# Patient Record
Sex: Male | Born: 1950 | Race: Black or African American | Hispanic: No | Marital: Single | Smoking: Former smoker
Health system: Southern US, Community
[De-identification: ages and names within clinical notes are randomized; demographics above are authoritative.]

## PROBLEM LIST (undated history)

## (undated) DIAGNOSIS — G4730 Sleep apnea, unspecified: Secondary | ICD-10-CM

## (undated) DIAGNOSIS — N2581 Secondary hyperparathyroidism of renal origin: Secondary | ICD-10-CM

## (undated) DIAGNOSIS — I509 Heart failure, unspecified: Secondary | ICD-10-CM

## (undated) DIAGNOSIS — N186 End stage renal disease: Secondary | ICD-10-CM

## (undated) DIAGNOSIS — G809 Cerebral palsy, unspecified: Secondary | ICD-10-CM

## (undated) DIAGNOSIS — N022 Recurrent and persistent hematuria with diffuse membranous glomerulonephritis: Secondary | ICD-10-CM

## (undated) DIAGNOSIS — N289 Disorder of kidney and ureter, unspecified: Secondary | ICD-10-CM

## (undated) DIAGNOSIS — N039 Chronic nephritic syndrome with unspecified morphologic changes: Secondary | ICD-10-CM

## (undated) DIAGNOSIS — I10 Essential (primary) hypertension: Secondary | ICD-10-CM

## (undated) DIAGNOSIS — E785 Hyperlipidemia, unspecified: Secondary | ICD-10-CM

## (undated) DIAGNOSIS — K219 Gastro-esophageal reflux disease without esophagitis: Secondary | ICD-10-CM

## (undated) DIAGNOSIS — D869 Sarcoidosis, unspecified: Secondary | ICD-10-CM

## (undated) DIAGNOSIS — D631 Anemia in chronic kidney disease: Secondary | ICD-10-CM

---

## 2002-03-24 ENCOUNTER — Emergency Department (HOSPITAL_COMMUNITY): Admission: EM | Admit: 2002-03-24 | Discharge: 2002-03-24 | Payer: Self-pay | Admitting: Emergency Medicine

## 2002-03-24 ENCOUNTER — Encounter: Payer: Self-pay | Admitting: Emergency Medicine

## 2005-04-26 ENCOUNTER — Ambulatory Visit: Payer: Self-pay | Admitting: General Practice

## 2005-08-31 ENCOUNTER — Ambulatory Visit: Payer: Self-pay | Admitting: Vascular Surgery

## 2005-08-31 ENCOUNTER — Other Ambulatory Visit: Payer: Self-pay

## 2005-09-06 ENCOUNTER — Ambulatory Visit: Payer: Self-pay | Admitting: Vascular Surgery

## 2006-02-03 ENCOUNTER — Other Ambulatory Visit: Payer: Self-pay

## 2006-02-03 ENCOUNTER — Emergency Department: Payer: Self-pay | Admitting: General Practice

## 2006-11-22 ENCOUNTER — Ambulatory Visit: Payer: Self-pay | Admitting: Internal Medicine

## 2006-11-30 ENCOUNTER — Ambulatory Visit: Payer: Self-pay | Admitting: Internal Medicine

## 2006-12-06 ENCOUNTER — Other Ambulatory Visit: Payer: Self-pay

## 2006-12-06 ENCOUNTER — Inpatient Hospital Stay: Payer: Self-pay | Admitting: Internal Medicine

## 2008-10-23 ENCOUNTER — Ambulatory Visit: Payer: Self-pay | Admitting: Internal Medicine

## 2010-03-08 ENCOUNTER — Ambulatory Visit: Payer: Self-pay | Admitting: Vascular Surgery

## 2010-08-25 ENCOUNTER — Ambulatory Visit: Payer: Self-pay | Admitting: Vascular Surgery

## 2010-12-12 ENCOUNTER — Ambulatory Visit: Payer: Self-pay | Admitting: Vascular Surgery

## 2011-05-01 ENCOUNTER — Inpatient Hospital Stay: Payer: Self-pay | Admitting: Internal Medicine

## 2011-12-04 ENCOUNTER — Inpatient Hospital Stay: Payer: Self-pay | Admitting: Internal Medicine

## 2011-12-04 LAB — CBC WITH DIFFERENTIAL/PLATELET
Basophil #: 0 x10 3/mm 3
Basophil %: 0.3 %
Eosinophil #: 0 x10 3/mm 3
Eosinophil %: 0.1 %
HCT: 30.2 % — ABNORMAL LOW
HGB: 10.3 g/dL — ABNORMAL LOW
Lymphocyte %: 9.9 %
Lymphs Abs: 0.8 x10 3/mm 3 — ABNORMAL LOW
MCH: 32.3 pg
MCHC: 34 g/dL
MCV: 95 fL
Monocyte #: 1.2 "x10 3/mm " — ABNORMAL HIGH
Monocyte %: 14.6 %
Neutrophil #: 6.3 x10 3/mm 3
Neutrophil %: 75.1 %
Platelet: 123 x10 3/mm 3 — ABNORMAL LOW
RBC: 3.19 x10 6/mm 3 — ABNORMAL LOW
RDW: 13.8 %
WBC: 8.4 x10 3/mm 3

## 2011-12-04 LAB — COMPREHENSIVE METABOLIC PANEL WITH GFR
Albumin: 3.2 g/dL — ABNORMAL LOW
Alkaline Phosphatase: 91 U/L
Anion Gap: 10
BUN: 86 mg/dL — ABNORMAL HIGH
Bilirubin,Total: 0.5 mg/dL
Calcium, Total: 9.4 mg/dL
Chloride: 102 mmol/L
Co2: 29 mmol/L
Creatinine: 12.94 mg/dL — ABNORMAL HIGH
EGFR (African American): 4 — ABNORMAL LOW
EGFR (Non-African Amer.): 4 — ABNORMAL LOW
Glucose: 105 mg/dL — ABNORMAL HIGH
Osmolality: 308
Potassium: 5.4 mmol/L — ABNORMAL HIGH
SGOT(AST): 25 U/L
SGPT (ALT): 20 U/L
Sodium: 141 mmol/L
Total Protein: 8 g/dL

## 2011-12-04 LAB — PROTIME-INR
INR: 1.1
Prothrombin Time: 14.1 s

## 2011-12-04 LAB — PHOSPHORUS: Phosphorus: 1.2 mg/dL — ABNORMAL LOW (ref 2.5–4.9)

## 2011-12-04 LAB — MAGNESIUM: Magnesium: 1.8 mg/dL

## 2011-12-04 LAB — APTT: Activated PTT: 37.4 s — ABNORMAL HIGH

## 2011-12-05 LAB — CBC WITH DIFFERENTIAL/PLATELET
Basophil #: 0 10*3/uL (ref 0.0–0.1)
Basophil %: 0.2 %
Eosinophil %: 0.2 %
Eosinophil %: 0 %
HGB: 9.9 g/dL — ABNORMAL LOW (ref 13.0–18.0)
Lymphocyte #: 0.3 10*3/uL — ABNORMAL LOW (ref 1.0–3.6)
Lymphocyte %: 2.8 %
MCHC: 32.1 g/dL (ref 32.0–36.0)
MCV: 96 fL (ref 80–100)
Monocyte #: 1.3 x10 3/mm — ABNORMAL HIGH (ref 0.2–1.0)
Monocyte #: 0.5 x10 3/mm (ref 0.2–1.0)
Monocyte %: 13.6 %
Neutrophil #: 10.9 10*3/uL — ABNORMAL HIGH (ref 1.4–6.5)
Neutrophil %: 75.9 %
Neutrophil %: 92.4 %
Platelet: 105 10*3/uL — ABNORMAL LOW (ref 150–440)
Platelet: 116 10*3/uL — ABNORMAL LOW (ref 150–440)
RBC: 3.1 10*6/uL — ABNORMAL LOW (ref 4.40–5.90)
RDW: 14.2 % (ref 11.5–14.5)
WBC: 9.8 10*3/uL (ref 3.8–10.6)

## 2011-12-05 LAB — RENAL FUNCTION PANEL
Anion Gap: 14 (ref 7–16)
Calcium, Total: 9 mg/dL (ref 8.5–10.1)
Chloride: 98 mmol/L (ref 98–107)
Co2: 26 mmol/L (ref 21–32)
Creatinine: 16.18 mg/dL — ABNORMAL HIGH (ref 0.60–1.30)
EGFR (African American): 3 — ABNORMAL LOW
EGFR (Non-African Amer.): 3 — ABNORMAL LOW
Glucose: 188 mg/dL — ABNORMAL HIGH (ref 65–99)
Phosphorus: 2.9 mg/dL (ref 2.5–4.9)
Potassium: 5.7 mmol/L — ABNORMAL HIGH (ref 3.5–5.1)
Sodium: 138 mmol/L (ref 136–145)

## 2011-12-05 LAB — BASIC METABOLIC PANEL
Calcium, Total: 9 mg/dL (ref 8.5–10.1)
Chloride: 101 mmol/L (ref 98–107)
Co2: 28 mmol/L (ref 21–32)
EGFR (Non-African Amer.): 3 — ABNORMAL LOW
Glucose: 102 mg/dL — ABNORMAL HIGH (ref 65–99)
Osmolality: 316 (ref 275–301)
Potassium: 5.4 mmol/L — ABNORMAL HIGH (ref 3.5–5.1)
Sodium: 143 mmol/L (ref 136–145)

## 2011-12-06 LAB — BASIC METABOLIC PANEL
Calcium, Total: 9 mg/dL (ref 8.5–10.1)
Co2: 26 mmol/L (ref 21–32)
EGFR (African American): 5 — ABNORMAL LOW
EGFR (Non-African Amer.): 4 — ABNORMAL LOW
Glucose: 189 mg/dL — ABNORMAL HIGH (ref 65–99)
Osmolality: 299 (ref 275–301)
Potassium: 4.9 mmol/L (ref 3.5–5.1)
Sodium: 137 mmol/L (ref 136–145)

## 2011-12-06 LAB — CBC WITH DIFFERENTIAL/PLATELET
Basophil #: 0 10*3/uL (ref 0.0–0.1)
Eosinophil #: 0 10*3/uL (ref 0.0–0.7)
Eosinophil %: 0 %
HGB: 10.4 g/dL — ABNORMAL LOW (ref 13.0–18.0)
MCH: 31.5 pg (ref 26.0–34.0)
MCV: 94 fL (ref 80–100)
Monocyte %: 3.5 %
Neutrophil %: 93.5 %
Platelet: 130 10*3/uL — ABNORMAL LOW (ref 150–440)
RBC: 3.3 10*6/uL — ABNORMAL LOW (ref 4.40–5.90)
RDW: 13.9 % (ref 11.5–14.5)
WBC: 14.9 10*3/uL — ABNORMAL HIGH (ref 3.8–10.6)

## 2011-12-06 LAB — POTASSIUM: Potassium: 4.7 mmol/L (ref 3.5–5.1)

## 2011-12-07 LAB — CBC WITH DIFFERENTIAL/PLATELET
Basophil #: 0 10*3/uL (ref 0.0–0.1)
Eosinophil %: 0 %
Lymphocyte #: 0.7 10*3/uL — ABNORMAL LOW (ref 1.0–3.6)
Lymphocyte %: 4.7 %
MCH: 31.2 pg (ref 26.0–34.0)
MCV: 95 fL (ref 80–100)
Monocyte #: 0.8 x10 3/mm (ref 0.2–1.0)
Monocyte %: 5.5 %
Platelet: 177 10*3/uL (ref 150–440)
RBC: 3.45 10*6/uL — ABNORMAL LOW (ref 4.40–5.90)
RDW: 13.7 % (ref 11.5–14.5)
WBC: 14.1 10*3/uL — ABNORMAL HIGH (ref 3.8–10.6)

## 2011-12-08 LAB — CBC WITH DIFFERENTIAL/PLATELET
Basophil %: 0.1 %
Eosinophil #: 0 10*3/uL (ref 0.0–0.7)
HCT: 27.4 % — ABNORMAL LOW (ref 40.0–52.0)
HGB: 8.9 g/dL — ABNORMAL LOW (ref 13.0–18.0)
Lymphocyte #: 0.8 10*3/uL — ABNORMAL LOW (ref 1.0–3.6)
Lymphocyte %: 6.2 %
MCHC: 32.4 g/dL (ref 32.0–36.0)
Neutrophil #: 11.3 10*3/uL — ABNORMAL HIGH (ref 1.4–6.5)
Neutrophil %: 86.7 %
RDW: 13.9 % (ref 11.5–14.5)
WBC: 13 10*3/uL — ABNORMAL HIGH (ref 3.8–10.6)

## 2011-12-08 LAB — VANCOMYCIN, TROUGH: Vancomycin, Trough: 24 ug/mL (ref 10–20)

## 2011-12-10 LAB — CULTURE, BLOOD (SINGLE)

## 2011-12-11 LAB — CBC WITH DIFFERENTIAL/PLATELET
Basophil #: 0 10*3/uL (ref 0.0–0.1)
Basophil %: 0.1 %
Eosinophil #: 0.1 10*3/uL (ref 0.0–0.7)
HCT: 26.1 % — ABNORMAL LOW (ref 40.0–52.0)
HGB: 8.3 g/dL — ABNORMAL LOW (ref 13.0–18.0)
Lymphocyte #: 1.1 10*3/uL (ref 1.0–3.6)
MCH: 30.6 pg (ref 26.0–34.0)
MCHC: 31.9 g/dL — ABNORMAL LOW (ref 32.0–36.0)
Neutrophil #: 16 10*3/uL — ABNORMAL HIGH (ref 1.4–6.5)
Neutrophil %: 88.8 %

## 2011-12-11 LAB — CULTURE, BLOOD (SINGLE)

## 2011-12-11 LAB — RENAL FUNCTION PANEL
Albumin: 2.9 g/dL — ABNORMAL LOW (ref 3.4–5.0)
Calcium, Total: 8.1 mg/dL — ABNORMAL LOW (ref 8.5–10.1)
Chloride: 94 mmol/L — ABNORMAL LOW (ref 98–107)
Co2: 22 mmol/L (ref 21–32)
Creatinine: 11.79 mg/dL — ABNORMAL HIGH (ref 0.60–1.30)
EGFR (Non-African Amer.): 4 — ABNORMAL LOW
Potassium: 5.2 mmol/L — ABNORMAL HIGH (ref 3.5–5.1)
Sodium: 137 mmol/L (ref 136–145)

## 2011-12-11 LAB — PATHOLOGY REPORT

## 2012-02-22 ENCOUNTER — Ambulatory Visit: Payer: Self-pay | Admitting: Vascular Surgery

## 2012-02-22 LAB — BASIC METABOLIC PANEL
BUN: 43 mg/dL — ABNORMAL HIGH (ref 7–18)
Chloride: 101 mmol/L (ref 98–107)
Creatinine: 9.02 mg/dL — ABNORMAL HIGH (ref 0.60–1.30)
EGFR (Non-African Amer.): 6 — ABNORMAL LOW
Osmolality: 292 (ref 275–301)
Potassium: 4.7 mmol/L (ref 3.5–5.1)
Sodium: 141 mmol/L (ref 136–145)

## 2012-02-22 LAB — CBC
HCT: 38.2 % — ABNORMAL LOW (ref 40.0–52.0)
HGB: 12.9 g/dL — ABNORMAL LOW (ref 13.0–18.0)
MCV: 95 fL (ref 80–100)
Platelet: 218 10*3/uL (ref 150–440)
RBC: 4.04 10*6/uL — ABNORMAL LOW (ref 4.40–5.90)
WBC: 7.1 10*3/uL (ref 3.8–10.6)

## 2012-02-29 ENCOUNTER — Ambulatory Visit: Payer: Self-pay | Admitting: Vascular Surgery

## 2012-06-06 ENCOUNTER — Ambulatory Visit: Payer: Self-pay | Admitting: Vascular Surgery

## 2012-07-25 ENCOUNTER — Ambulatory Visit: Payer: Self-pay | Admitting: Vascular Surgery

## 2012-09-16 ENCOUNTER — Inpatient Hospital Stay: Payer: Self-pay | Admitting: Internal Medicine

## 2012-09-16 LAB — COMPREHENSIVE METABOLIC PANEL
Albumin: 3.5 g/dL (ref 3.4–5.0)
Alkaline Phosphatase: 101 U/L (ref 50–136)
Anion Gap: 12 (ref 7–16)
Calcium, Total: 9.4 mg/dL (ref 8.5–10.1)
Chloride: 101 mmol/L (ref 98–107)
Co2: 25 mmol/L (ref 21–32)
EGFR (African American): 3 — ABNORMAL LOW
EGFR (Non-African Amer.): 3 — ABNORMAL LOW
Osmolality: 303 (ref 275–301)
SGOT(AST): 19 U/L (ref 15–37)
SGPT (ALT): 18 U/L (ref 12–78)
Sodium: 138 mmol/L (ref 136–145)
Total Protein: 8 g/dL (ref 6.4–8.2)

## 2012-09-16 LAB — CBC
MCH: 32.1 pg (ref 26.0–34.0)
MCHC: 33.9 g/dL (ref 32.0–36.0)
MCV: 95 fL (ref 80–100)
Platelet: 168 10*3/uL (ref 150–440)
RDW: 14.9 % — ABNORMAL HIGH (ref 11.5–14.5)
WBC: 6.9 10*3/uL (ref 3.8–10.6)

## 2012-09-16 LAB — MAGNESIUM: Magnesium: 1.6 mg/dL — ABNORMAL LOW

## 2012-09-16 LAB — CK TOTAL AND CKMB (NOT AT ARMC)
CK, Total: 114 U/L (ref 35–232)
CK-MB: 1.1 ng/mL (ref 0.5–3.6)

## 2012-09-16 LAB — PROTIME-INR
INR: 1
Prothrombin Time: 13.2 secs (ref 11.5–14.7)

## 2012-09-16 LAB — TROPONIN I
Troponin-I: <0.02 ng/mL
Troponin-I: <0.02 ng/mL

## 2012-09-16 LAB — BASIC METABOLIC PANEL
BUN: 35 mg/dL — ABNORMAL HIGH (ref 7–18)
Calcium, Total: 9.1 mg/dL (ref 8.5–10.1)
Chloride: 98 mmol/L (ref 98–107)
EGFR (African American): 10 — ABNORMAL LOW
Glucose: 93 mg/dL (ref 65–99)
Osmolality: 283 (ref 275–301)
Sodium: 138 mmol/L (ref 136–145)

## 2012-09-16 LAB — PHOSPHORUS: Phosphorus: 5.3 mg/dL — ABNORMAL HIGH (ref 2.5–4.9)

## 2012-09-17 LAB — CK TOTAL AND CKMB (NOT AT ARMC)
CK, Total: 136 U/L (ref 35–232)
CK-MB: 1.7 ng/mL (ref 0.5–3.6)

## 2012-09-17 LAB — BASIC METABOLIC PANEL
Co2: 31 mmol/L (ref 21–32)
Creatinine: 9.52 mg/dL — ABNORMAL HIGH (ref 0.60–1.30)
EGFR (African American): 6 — ABNORMAL LOW
Glucose: 84 mg/dL (ref 65–99)
Potassium: 5 mmol/L (ref 3.5–5.1)
Sodium: 136 mmol/L (ref 136–145)

## 2012-09-17 LAB — TROPONIN I: Troponin-I: <0.02 ng/mL

## 2012-09-18 LAB — PHOSPHORUS: Phosphorus: 6.2 mg/dL — ABNORMAL HIGH (ref 2.5–4.9)

## 2012-12-12 ENCOUNTER — Ambulatory Visit: Payer: Self-pay | Admitting: Vascular Surgery

## 2012-12-12 LAB — CBC
HCT: 33 % — ABNORMAL LOW (ref 40.0–52.0)
HGB: 11.3 g/dL — ABNORMAL LOW (ref 13.0–18.0)
MCH: 31.8 pg (ref 26.0–34.0)
MCHC: 34.2 g/dL (ref 32.0–36.0)
MCV: 93 fL (ref 80–100)
Platelet: 203 10*3/uL (ref 150–440)
RBC: 3.55 10*6/uL — ABNORMAL LOW (ref 4.40–5.90)
WBC: 7.1 10*3/uL (ref 3.8–10.6)

## 2012-12-12 LAB — BASIC METABOLIC PANEL
BUN: 41 mg/dL — ABNORMAL HIGH (ref 7–18)
Calcium, Total: 9.7 mg/dL (ref 8.5–10.1)
Creatinine: 7.64 mg/dL — ABNORMAL HIGH (ref 0.60–1.30)
EGFR (African American): 8 — ABNORMAL LOW
EGFR (Non-African Amer.): 7 — ABNORMAL LOW
Glucose: 89 mg/dL (ref 65–99)
Sodium: 137 mmol/L (ref 136–145)

## 2012-12-19 ENCOUNTER — Ambulatory Visit: Payer: Self-pay | Admitting: Vascular Surgery

## 2012-12-20 ENCOUNTER — Emergency Department: Payer: Self-pay

## 2012-12-20 LAB — CBC
HCT: 31 % — ABNORMAL LOW (ref 40.0–52.0)
HGB: 10.7 g/dL — ABNORMAL LOW (ref 13.0–18.0)
MCH: 32.3 pg (ref 26.0–34.0)
MCHC: 34.4 g/dL (ref 32.0–36.0)
MCV: 94 fL (ref 80–100)
RBC: 3.3 10*6/uL — ABNORMAL LOW (ref 4.40–5.90)
RDW: 15 % — ABNORMAL HIGH (ref 11.5–14.5)

## 2012-12-20 LAB — COMPREHENSIVE METABOLIC PANEL
Albumin: 3.3 g/dL — ABNORMAL LOW (ref 3.4–5.0)
BUN: 24 mg/dL — ABNORMAL HIGH (ref 7–18)
Calcium, Total: 9.1 mg/dL (ref 8.5–10.1)
EGFR (African American): 11 — ABNORMAL LOW
Potassium: 4.8 mmol/L (ref 3.5–5.1)
SGOT(AST): 29 U/L (ref 15–37)

## 2012-12-20 LAB — LIPASE, BLOOD: Lipase: 97 U/L (ref 73–393)

## 2013-01-16 ENCOUNTER — Ambulatory Visit: Payer: Self-pay | Admitting: Vascular Surgery

## 2013-02-10 ENCOUNTER — Inpatient Hospital Stay: Payer: Self-pay | Admitting: Internal Medicine

## 2013-02-10 LAB — BASIC METABOLIC PANEL
Anion Gap: 10 (ref 7–16)
BUN: 109 mg/dL — ABNORMAL HIGH (ref 7–18)
BUN: 100 mg/dL — ABNORMAL HIGH (ref 7–18)
Calcium, Total: 9.7 mg/dL (ref 8.5–10.1)
Chloride: 100 mmol/L (ref 98–107)
Chloride: 102 mmol/L (ref 98–107)
Co2: 22 mmol/L (ref 21–32)
Creatinine: 12.54 mg/dL — ABNORMAL HIGH (ref 0.60–1.30)
EGFR (Non-African Amer.): 3 — ABNORMAL LOW
Glucose: 102 mg/dL — ABNORMAL HIGH (ref 65–99)
Osmolality: 299 (ref 275–301)
Osmolality: 300 (ref 275–301)
Potassium: 7.2 mmol/L (ref 3.5–5.1)
Sodium: 132 mmol/L — ABNORMAL LOW (ref 136–145)
Sodium: 134 mmol/L — ABNORMAL LOW (ref 136–145)

## 2013-02-10 LAB — CK TOTAL AND CKMB (NOT AT ARMC)
CK, Total: 69 U/L (ref 35–232)
CK-MB: 1.1 ng/mL (ref 0.5–3.6)

## 2013-02-10 LAB — CBC
HCT: 33.2 % — ABNORMAL LOW (ref 40.0–52.0)
HGB: 11.3 g/dL — ABNORMAL LOW (ref 13.0–18.0)
MCH: 32.5 pg (ref 26.0–34.0)
MCHC: 34 g/dL (ref 32.0–36.0)
Platelet: 171 10*3/uL (ref 150–440)
RDW: 15.7 % — ABNORMAL HIGH (ref 11.5–14.5)

## 2013-02-10 LAB — TROPONIN I: Troponin-I: <0.02 ng/mL

## 2013-02-10 LAB — POTASSIUM: Potassium: 6.1 mmol/L — ABNORMAL HIGH (ref 3.5–5.1)

## 2013-02-11 DIAGNOSIS — I498 Other specified cardiac arrhythmias: Secondary | ICD-10-CM

## 2013-02-11 DIAGNOSIS — R0602 Shortness of breath: Secondary | ICD-10-CM

## 2013-02-11 DIAGNOSIS — I959 Hypotension, unspecified: Secondary | ICD-10-CM

## 2013-02-11 DIAGNOSIS — J9600 Acute respiratory failure, unspecified whether with hypoxia or hypercapnia: Secondary | ICD-10-CM

## 2013-02-11 LAB — CBC WITH DIFFERENTIAL/PLATELET
Basophil #: 0 10*3/uL (ref 0.0–0.1)
Basophil %: 0.6 %
Eosinophil %: 2.2 %
HCT: 31.5 % — ABNORMAL LOW (ref 40.0–52.0)
HGB: 10.9 g/dL — ABNORMAL LOW (ref 13.0–18.0)
Lymphocyte #: 0.8 10*3/uL — ABNORMAL LOW (ref 1.0–3.6)
Lymphocyte %: 10.9 %
MCH: 33 pg (ref 26.0–34.0)
MCHC: 34.6 g/dL (ref 32.0–36.0)
MCV: 95 fL (ref 80–100)
Monocyte #: 0.7 x10 3/mm (ref 0.2–1.0)
Neutrophil %: 76.9 %
Platelet: 137 10*3/uL — ABNORMAL LOW (ref 150–440)
RDW: 15.4 % — ABNORMAL HIGH (ref 11.5–14.5)

## 2013-02-11 LAB — BASIC METABOLIC PANEL
BUN: 57 mg/dL — ABNORMAL HIGH (ref 7–18)
BUN: 75 mg/dL — ABNORMAL HIGH (ref 7–18)
BUN: 83 mg/dL — ABNORMAL HIGH (ref 7–18)
Calcium, Total: 8.6 mg/dL (ref 8.5–10.1)
Calcium, Total: 9.3 mg/dL (ref 8.5–10.1)
Chloride: 99 mmol/L (ref 98–107)
Co2: 28 mmol/L (ref 21–32)
Co2: 29 mmol/L (ref 21–32)
Co2: 31 mmol/L (ref 21–32)
Creatinine: 10.05 mg/dL — ABNORMAL HIGH (ref 0.60–1.30)
Creatinine: 10.72 mg/dL — ABNORMAL HIGH (ref 0.60–1.30)
Creatinine: 7.43 mg/dL — ABNORMAL HIGH (ref 0.60–1.30)
EGFR (African American): 5 — ABNORMAL LOW
EGFR (Non-African Amer.): 7 — ABNORMAL LOW
Glucose: 112 mg/dL — ABNORMAL HIGH (ref 65–99)
Glucose: 120 mg/dL — ABNORMAL HIGH (ref 65–99)
Glucose: 99 mg/dL (ref 65–99)
Osmolality: 294 (ref 275–301)
Osmolality: 281 (ref 275–301)
Potassium: 4.3 mmol/L (ref 3.5–5.1)
Potassium: 4.5 mmol/L (ref 3.5–5.1)
Potassium: 5.5 mmol/L — ABNORMAL HIGH (ref 3.5–5.1)

## 2013-02-11 LAB — TROPONIN I
Troponin-I: <0.02 ng/mL
Troponin-I: <0.02 ng/mL

## 2013-02-11 LAB — CK TOTAL AND CKMB (NOT AT ARMC)
CK-MB: 2.2 ng/mL (ref 0.5–3.6)
CK-MB: 2.1 ng/mL (ref 0.5–3.6)

## 2013-02-12 DIAGNOSIS — I498 Other specified cardiac arrhythmias: Secondary | ICD-10-CM

## 2013-02-12 DIAGNOSIS — J984 Other disorders of lung: Secondary | ICD-10-CM

## 2013-02-12 LAB — CBC WITH DIFFERENTIAL/PLATELET
Basophil #: 0 10*3/uL (ref 0.0–0.1)
Basophil %: 0.3 %
Eosinophil #: 0.2 10*3/uL (ref 0.0–0.7)
HCT: 27.7 % — ABNORMAL LOW (ref 40.0–52.0)
HGB: 9.6 g/dL — ABNORMAL LOW (ref 13.0–18.0)
Lymphocyte #: 0.8 10*3/uL — ABNORMAL LOW (ref 1.0–3.6)
Lymphocyte %: 13.3 %
MCHC: 34.8 g/dL (ref 32.0–36.0)
MCV: 95 fL (ref 80–100)
Monocyte #: 0.8 x10 3/mm (ref 0.2–1.0)
Monocyte %: 13.7 %
Neutrophil #: 4.3 10*3/uL (ref 1.4–6.5)
Neutrophil %: 70.2 %
Platelet: 109 10*3/uL — ABNORMAL LOW (ref 150–440)
RBC: 2.93 10*6/uL — ABNORMAL LOW (ref 4.40–5.90)
RDW: 15 % — ABNORMAL HIGH (ref 11.5–14.5)

## 2013-02-12 LAB — PHOSPHORUS: Phosphorus: 4.7 mg/dL (ref 2.5–4.9)

## 2013-02-12 LAB — COMPREHENSIVE METABOLIC PANEL
Alkaline Phosphatase: 132 U/L (ref 50–136)
Anion Gap: 5 — ABNORMAL LOW (ref 7–16)
Calcium, Total: 8.5 mg/dL (ref 8.5–10.1)
Chloride: 98 mmol/L (ref 98–107)
Co2: 29 mmol/L (ref 21–32)
Creatinine: 8.2 mg/dL — ABNORMAL HIGH (ref 0.60–1.30)
EGFR (African American): 7 — ABNORMAL LOW
EGFR (Non-African Amer.): 6 — ABNORMAL LOW
Glucose: 105 mg/dL — ABNORMAL HIGH (ref 65–99)
Osmolality: 281 (ref 275–301)
Potassium: 4.9 mmol/L (ref 3.5–5.1)
SGOT(AST): 19 U/L (ref 15–37)
SGPT (ALT): 15 U/L (ref 12–78)
Sodium: 132 mmol/L — ABNORMAL LOW (ref 136–145)

## 2013-02-13 DIAGNOSIS — I498 Other specified cardiac arrhythmias: Secondary | ICD-10-CM

## 2013-02-13 DIAGNOSIS — J984 Other disorders of lung: Secondary | ICD-10-CM

## 2013-02-13 LAB — CBC WITH DIFFERENTIAL/PLATELET
Basophil %: 0.2 %
Lymphocyte #: 0.5 10*3/uL — ABNORMAL LOW (ref 1.0–3.6)
Lymphocyte %: 7.7 %
Monocyte #: 0.5 x10 3/mm (ref 0.2–1.0)
Monocyte %: 8 %
Neutrophil #: 5.7 10*3/uL (ref 1.4–6.5)
Neutrophil %: 84 %
Platelet: 104 10*3/uL — ABNORMAL LOW (ref 150–440)
RDW: 15 % — ABNORMAL HIGH (ref 11.5–14.5)

## 2013-02-13 LAB — COMPREHENSIVE METABOLIC PANEL
Alkaline Phosphatase: 127 U/L (ref 50–136)
Anion Gap: 4 — ABNORMAL LOW (ref 7–16)
Calcium, Total: 8.5 mg/dL (ref 8.5–10.1)
Chloride: 100 mmol/L (ref 98–107)
Creatinine: 7.72 mg/dL — ABNORMAL HIGH (ref 0.60–1.30)
EGFR (African American): 8 — ABNORMAL LOW
Glucose: 114 mg/dL — ABNORMAL HIGH (ref 65–99)
SGPT (ALT): 13 U/L (ref 12–78)
Sodium: 132 mmol/L — ABNORMAL LOW (ref 136–145)
Total Protein: 6.9 g/dL (ref 6.4–8.2)

## 2013-02-14 LAB — BASIC METABOLIC PANEL
Anion Gap: 5 — ABNORMAL LOW (ref 7–16)
Calcium, Total: 8.7 mg/dL (ref 8.5–10.1)
Chloride: 96 mmol/L — ABNORMAL LOW (ref 98–107)
Co2: 32 mmol/L (ref 21–32)
EGFR (Non-African Amer.): 8 — ABNORMAL LOW
Glucose: 102 mg/dL — ABNORMAL HIGH (ref 65–99)
Potassium: 4.2 mmol/L (ref 3.5–5.1)

## 2013-02-14 LAB — PHOSPHORUS: Phosphorus: 2.5 mg/dL (ref 2.5–4.9)

## 2013-02-15 LAB — CBC WITH DIFFERENTIAL/PLATELET
Basophil #: 0.1 10*3/uL (ref 0.0–0.1)
Eosinophil #: 0.2 10*3/uL (ref 0.0–0.7)
Eosinophil %: 2.3 %
Lymphocyte %: 15 %
MCH: 32.4 pg (ref 26.0–34.0)
Monocyte #: 0.9 x10 3/mm (ref 0.2–1.0)
Monocyte %: 12.1 %
Neutrophil #: 5.2 10*3/uL (ref 1.4–6.5)
Neutrophil %: 69.7 %
RBC: 2.83 10*6/uL — ABNORMAL LOW (ref 4.40–5.90)
RDW: 15 % — ABNORMAL HIGH (ref 11.5–14.5)
WBC: 7.5 10*3/uL (ref 3.8–10.6)

## 2013-02-16 LAB — CULTURE, BLOOD (SINGLE)

## 2013-02-17 LAB — PHOSPHORUS: Phosphorus: 4 mg/dL (ref 2.5–4.9)

## 2013-02-18 LAB — BASIC METABOLIC PANEL
Anion Gap: 4 — ABNORMAL LOW (ref 7–16)
Calcium, Total: 9 mg/dL (ref 8.5–10.1)
Co2: 33 mmol/L — ABNORMAL HIGH (ref 21–32)
Creatinine: 7.98 mg/dL — ABNORMAL HIGH (ref 0.60–1.30)
EGFR (African American): 8 — ABNORMAL LOW
EGFR (Non-African Amer.): 7 — ABNORMAL LOW
Glucose: 134 mg/dL — ABNORMAL HIGH (ref 65–99)
Sodium: 136 mmol/L (ref 136–145)

## 2013-02-19 LAB — PHOSPHORUS: Phosphorus: 4.5 mg/dL (ref 2.5–4.9)

## 2013-02-20 LAB — PLATELET COUNT: Platelet: 197 10*3/uL (ref 150–440)

## 2013-02-21 ENCOUNTER — Observation Stay: Payer: Self-pay | Admitting: Internal Medicine

## 2013-02-21 LAB — COMPREHENSIVE METABOLIC PANEL
Albumin: 3.1 g/dL — ABNORMAL LOW (ref 3.4–5.0)
Alkaline Phosphatase: 115 U/L (ref 50–136)
Anion Gap: 8 (ref 7–16)
BUN: 32 mg/dL — ABNORMAL HIGH (ref 7–18)
Bilirubin,Total: 0.4 mg/dL (ref 0.2–1.0)
Co2: 30 mmol/L (ref 21–32)
EGFR (Non-African Amer.): 7 — ABNORMAL LOW
Glucose: 102 mg/dL — ABNORMAL HIGH (ref 65–99)
Osmolality: 283 (ref 275–301)
Potassium: 4.4 mmol/L (ref 3.5–5.1)
SGOT(AST): 17 U/L (ref 15–37)
SGPT (ALT): 12 U/L (ref 12–78)
Sodium: 138 mmol/L (ref 136–145)

## 2013-02-21 LAB — CBC
HCT: 26.5 % — ABNORMAL LOW (ref 40.0–52.0)
HGB: 9.2 g/dL — ABNORMAL LOW (ref 13.0–18.0)
MCH: 32.9 pg (ref 26.0–34.0)
RBC: 2.78 10*6/uL — ABNORMAL LOW (ref 4.40–5.90)
RDW: 15.2 % — ABNORMAL HIGH (ref 11.5–14.5)
WBC: 9.5 10*3/uL (ref 3.8–10.6)

## 2013-02-21 LAB — TROPONIN I
Troponin-I: <0.02 ng/mL
Troponin-I: <0.02 ng/mL

## 2013-02-21 LAB — CK TOTAL AND CKMB (NOT AT ARMC)
CK, Total: 53 U/L (ref 35–232)
CK-MB: 1.3 ng/mL (ref 0.5–3.6)

## 2013-02-22 DIAGNOSIS — R079 Chest pain, unspecified: Secondary | ICD-10-CM

## 2013-02-22 DIAGNOSIS — I441 Atrioventricular block, second degree: Secondary | ICD-10-CM

## 2013-02-22 LAB — TROPONIN I: Troponin-I: <0.02 ng/mL

## 2013-02-22 LAB — CBC WITH DIFFERENTIAL/PLATELET
Eosinophil #: 0.1 10*3/uL (ref 0.0–0.7)
Eosinophil %: 2.1 %
HCT: 23.5 % — ABNORMAL LOW (ref 40.0–52.0)
HGB: 7.9 g/dL — ABNORMAL LOW (ref 13.0–18.0)
Lymphocyte #: 0.8 10*3/uL — ABNORMAL LOW (ref 1.0–3.6)
Lymphocyte %: 10.9 %
MCH: 32.8 pg (ref 26.0–34.0)
MCHC: 33.6 g/dL (ref 32.0–36.0)
MCV: 98 fL (ref 80–100)
Monocyte #: 1.1 x10 3/mm — ABNORMAL HIGH (ref 0.2–1.0)
Neutrophil #: 5.1 10*3/uL (ref 1.4–6.5)
Neutrophil %: 71.3 %
Platelet: 214 10*3/uL (ref 150–440)
RBC: 2.41 10*6/uL — ABNORMAL LOW (ref 4.40–5.90)
RDW: 15.4 % — ABNORMAL HIGH (ref 11.5–14.5)

## 2013-02-22 LAB — BASIC METABOLIC PANEL
Anion Gap: 8 (ref 7–16)
Calcium, Total: 9 mg/dL (ref 8.5–10.1)
Chloride: 101 mmol/L (ref 98–107)
Creatinine: 10.17 mg/dL — ABNORMAL HIGH (ref 0.60–1.30)
EGFR (Non-African Amer.): 5 — ABNORMAL LOW
Sodium: 138 mmol/L (ref 136–145)

## 2013-02-22 LAB — LIPID PANEL
Cholesterol: 140 mg/dL (ref 0–200)
HDL Cholesterol: 34 mg/dL — ABNORMAL LOW (ref 40–60)
Ldl Cholesterol, Calc: 84 mg/dL (ref 0–100)
Triglycerides: 112 mg/dL (ref 0–200)
VLDL Cholesterol, Calc: 22 mg/dL (ref 5–40)

## 2013-02-22 LAB — PHOSPHORUS: Phosphorus: 3.8 mg/dL (ref 2.5–4.9)

## 2013-02-24 ENCOUNTER — Telehealth: Payer: Self-pay

## 2013-02-24 NOTE — Telephone Encounter (Signed)
TCM/PH needs 1 wk follow up with Dr. Kirke Corin. LMTCB.

## 2013-02-26 ENCOUNTER — Other Ambulatory Visit: Payer: Self-pay

## 2013-02-26 LAB — CBC WITH DIFFERENTIAL/PLATELET
Basophil %: 0.9 %
Eosinophil #: 0.2 10*3/uL (ref 0.0–0.7)
HCT: 29.3 % — ABNORMAL LOW (ref 40.0–52.0)
HGB: 9.8 g/dL — ABNORMAL LOW (ref 13.0–18.0)
Lymphocyte %: 15.2 %
MCH: 31.9 pg (ref 26.0–34.0)
Monocyte #: 0.9 x10 3/mm (ref 0.2–1.0)
Platelet: 291 10*3/uL (ref 150–440)
RBC: 3.07 10*6/uL — ABNORMAL LOW (ref 4.40–5.90)
RDW: 14.8 % — ABNORMAL HIGH (ref 11.5–14.5)
WBC: 7.8 10*3/uL (ref 3.8–10.6)

## 2013-02-27 LAB — LIPID PANEL
HDL Cholesterol: 26 mg/dL — ABNORMAL LOW (ref 40–60)
Ldl Cholesterol, Calc: 83 mg/dL (ref 0–100)
Triglycerides: 234 mg/dL — ABNORMAL HIGH (ref 0–200)
VLDL Cholesterol, Calc: 47 mg/dL — ABNORMAL HIGH (ref 5–40)

## 2013-02-27 LAB — COMPREHENSIVE METABOLIC PANEL
Alkaline Phosphatase: 128 U/L (ref 50–136)
Anion Gap: 9 (ref 7–16)
BUN: 15 mg/dL (ref 7–18)
Chloride: 98 mmol/L (ref 98–107)
Co2: 30 mmol/L (ref 21–32)
EGFR (African American): 15 — ABNORMAL LOW
Osmolality: 275 (ref 275–301)
SGOT(AST): 12 U/L — ABNORMAL LOW (ref 15–37)
SGPT (ALT): 12 U/L (ref 12–78)
Sodium: 137 mmol/L (ref 136–145)
Total Protein: 8.5 g/dL — ABNORMAL HIGH (ref 6.4–8.2)

## 2013-02-27 LAB — MAGNESIUM: Magnesium: 1.7 mg/dL — ABNORMAL LOW

## 2013-03-11 ENCOUNTER — Ambulatory Visit: Payer: Self-pay | Admitting: Physician Assistant

## 2013-03-19 ENCOUNTER — Inpatient Hospital Stay: Payer: Self-pay | Admitting: Internal Medicine

## 2013-03-19 LAB — COMPREHENSIVE METABOLIC PANEL
Albumin: 3.4 g/dL (ref 3.4–5.0)
Anion Gap: 6 — ABNORMAL LOW (ref 7–16)
Bilirubin,Total: 0.4 mg/dL (ref 0.2–1.0)
EGFR (African American): 15 — ABNORMAL LOW
Osmolality: 269 (ref 275–301)
Potassium: 3.7 mmol/L (ref 3.5–5.1)
SGOT(AST): 24 U/L (ref 15–37)
SGPT (ALT): 16 U/L (ref 12–78)
Total Protein: 8.7 g/dL — ABNORMAL HIGH (ref 6.4–8.2)

## 2013-03-19 LAB — CBC WITH DIFFERENTIAL/PLATELET
Eosinophil #: 0 10*3/uL (ref 0.0–0.7)
HCT: 33.1 % — ABNORMAL LOW (ref 40.0–52.0)
HGB: 11.2 g/dL — ABNORMAL LOW (ref 13.0–18.0)
Lymphocyte #: 0.8 10*3/uL — ABNORMAL LOW (ref 1.0–3.6)
Lymphocyte %: 8.9 %
MCHC: 33.9 g/dL (ref 32.0–36.0)
MCV: 94 fL (ref 80–100)
Monocyte #: 1.2 x10 3/mm — ABNORMAL HIGH (ref 0.2–1.0)
Monocyte %: 12.8 %
Neutrophil %: 77.7 %
RBC: 3.54 10*6/uL — ABNORMAL LOW (ref 4.40–5.90)
RDW: 14.1 % (ref 11.5–14.5)
WBC: 9.2 10*3/uL (ref 3.8–10.6)

## 2013-03-19 LAB — LIPASE, BLOOD: Lipase: 95 U/L (ref 73–393)

## 2013-03-19 LAB — PROTIME-INR: Prothrombin Time: 13.8 secs (ref 11.5–14.7)

## 2013-03-19 LAB — APTT: Activated PTT: 36.5 secs — ABNORMAL HIGH (ref 23.6–35.9)

## 2013-03-20 LAB — BASIC METABOLIC PANEL
Anion Gap: 3 — ABNORMAL LOW (ref 7–16)
Chloride: 100 mmol/L (ref 98–107)
Co2: 32 mmol/L (ref 21–32)
Creatinine: 6.05 mg/dL — ABNORMAL HIGH (ref 0.60–1.30)
EGFR (African American): 11 — ABNORMAL LOW
EGFR (Non-African Amer.): 9 — ABNORMAL LOW
Glucose: 139 mg/dL — ABNORMAL HIGH (ref 65–99)
Sodium: 135 mmol/L — ABNORMAL LOW (ref 136–145)

## 2013-03-20 LAB — CBC WITH DIFFERENTIAL/PLATELET
Basophil #: 0 10*3/uL (ref 0.0–0.1)
Basophil %: 0.5 %
Eosinophil #: 0.1 10*3/uL (ref 0.0–0.7)
Eosinophil %: 1 %
HCT: 30.6 % — ABNORMAL LOW (ref 40.0–52.0)
HGB: 10.2 g/dL — ABNORMAL LOW (ref 13.0–18.0)
Lymphocyte #: 0.8 10*3/uL — ABNORMAL LOW (ref 1.0–3.6)
Lymphocyte %: 12.2 %
Neutrophil #: 4.6 10*3/uL (ref 1.4–6.5)
Neutrophil %: 70.5 %
Platelet: 164 10*3/uL (ref 150–440)
RDW: 14.6 % — ABNORMAL HIGH (ref 11.5–14.5)

## 2013-03-21 LAB — PHOSPHORUS: Phosphorus: 2.4 mg/dL — ABNORMAL LOW (ref 2.5–4.9)

## 2013-03-21 LAB — BASIC METABOLIC PANEL
Chloride: 98 mmol/L (ref 98–107)
EGFR (African American): 7 — ABNORMAL LOW
Osmolality: 273 (ref 275–301)
Potassium: 4.6 mmol/L (ref 3.5–5.1)
Sodium: 133 mmol/L — ABNORMAL LOW (ref 136–145)

## 2013-03-21 LAB — CBC WITH DIFFERENTIAL/PLATELET
Basophil #: 0 10*3/uL (ref 0.0–0.1)
Eosinophil #: 0.2 10*3/uL (ref 0.0–0.7)
HGB: 11 g/dL — ABNORMAL LOW (ref 13.0–18.0)
Lymphocyte #: 0.9 10*3/uL — ABNORMAL LOW (ref 1.0–3.6)
MCH: 31.5 pg (ref 26.0–34.0)
MCV: 94 fL (ref 80–100)
Monocyte #: 1 x10 3/mm (ref 0.2–1.0)
Monocyte %: 12.3 %
Neutrophil #: 5.9 10*3/uL (ref 1.4–6.5)
Platelet: 185 10*3/uL (ref 150–440)
RBC: 3.48 10*6/uL — ABNORMAL LOW (ref 4.40–5.90)
WBC: 8 10*3/uL (ref 3.8–10.6)

## 2013-03-21 LAB — VANCOMYCIN, TROUGH: Vancomycin, Trough: 35 ug/mL (ref 10–20)

## 2013-03-22 ENCOUNTER — Ambulatory Visit: Payer: Self-pay | Admitting: Urology

## 2013-03-26 LAB — CULTURE, BLOOD (SINGLE)

## 2013-07-15 ENCOUNTER — Ambulatory Visit: Payer: Self-pay | Admitting: Vascular Surgery

## 2013-09-11 ENCOUNTER — Ambulatory Visit: Payer: Self-pay | Admitting: Vascular Surgery

## 2013-11-27 ENCOUNTER — Ambulatory Visit: Payer: Self-pay | Admitting: Vascular Surgery

## 2014-01-07 ENCOUNTER — Inpatient Hospital Stay (HOSPITAL_COMMUNITY): Payer: Medicare Other

## 2014-01-07 ENCOUNTER — Inpatient Hospital Stay (HOSPITAL_COMMUNITY)
Admission: EM | Admit: 2014-01-07 | Discharge: 2014-01-15 | DRG: 252 | Disposition: A | Discharge status: Skilled Nursing Facility | Payer: Medicare Other | Source: Ambulatory Visit | Attending: Internal Medicine | Admitting: Internal Medicine

## 2014-01-07 ENCOUNTER — Other Ambulatory Visit: Payer: Self-pay

## 2014-01-07 ENCOUNTER — Encounter (HOSPITAL_COMMUNITY)
Admission: EM | Disposition: A | Discharge status: Skilled Nursing Facility | Payer: Medicare Other | Source: Ambulatory Visit | Attending: Pulmonary Disease

## 2014-01-07 ENCOUNTER — Emergency Department (HOSPITAL_COMMUNITY): Payer: Medicare Other

## 2014-01-07 ENCOUNTER — Encounter (HOSPITAL_COMMUNITY): Payer: Self-pay | Admitting: Emergency Medicine

## 2014-01-07 DIAGNOSIS — G9340 Encephalopathy, unspecified: Secondary | ICD-10-CM | POA: Diagnosis present

## 2014-01-07 DIAGNOSIS — K219 Gastro-esophageal reflux disease without esophagitis: Secondary | ICD-10-CM | POA: Diagnosis present

## 2014-01-07 DIAGNOSIS — I498 Other specified cardiac arrhythmias: Secondary | ICD-10-CM | POA: Diagnosis present

## 2014-01-07 DIAGNOSIS — G4733 Obstructive sleep apnea (adult) (pediatric): Secondary | ICD-10-CM | POA: Diagnosis present

## 2014-01-07 DIAGNOSIS — J9600 Acute respiratory failure, unspecified whether with hypoxia or hypercapnia: Secondary | ICD-10-CM

## 2014-01-07 DIAGNOSIS — R570 Cardiogenic shock: Secondary | ICD-10-CM | POA: Diagnosis present

## 2014-01-07 DIAGNOSIS — I4892 Unspecified atrial flutter: Secondary | ICD-10-CM | POA: Diagnosis not present

## 2014-01-07 DIAGNOSIS — R061 Stridor: Secondary | ICD-10-CM | POA: Diagnosis not present

## 2014-01-07 DIAGNOSIS — Z87891 Personal history of nicotine dependence: Secondary | ICD-10-CM

## 2014-01-07 DIAGNOSIS — N039 Chronic nephritic syndrome with unspecified morphologic changes: Secondary | ICD-10-CM

## 2014-01-07 DIAGNOSIS — K2100 Gastro-esophageal reflux disease with esophagitis, without bleeding: Secondary | ICD-10-CM

## 2014-01-07 DIAGNOSIS — M949 Disorder of cartilage, unspecified: Secondary | ICD-10-CM

## 2014-01-07 DIAGNOSIS — I2510 Atherosclerotic heart disease of native coronary artery without angina pectoris: Secondary | ICD-10-CM | POA: Diagnosis present

## 2014-01-07 DIAGNOSIS — J810 Acute pulmonary edema: Secondary | ICD-10-CM | POA: Diagnosis present

## 2014-01-07 DIAGNOSIS — D631 Anemia in chronic kidney disease: Secondary | ICD-10-CM | POA: Diagnosis present

## 2014-01-07 DIAGNOSIS — G809 Cerebral palsy, unspecified: Secondary | ICD-10-CM | POA: Diagnosis present

## 2014-01-07 DIAGNOSIS — Y838 Other surgical procedures as the cause of abnormal reaction of the patient, or of later complication, without mention of misadventure at the time of the procedure: Secondary | ICD-10-CM | POA: Diagnosis present

## 2014-01-07 DIAGNOSIS — I4510 Unspecified right bundle-branch block: Secondary | ICD-10-CM | POA: Diagnosis present

## 2014-01-07 DIAGNOSIS — Z6841 Body Mass Index (BMI) 40.0 and over, adult: Secondary | ICD-10-CM

## 2014-01-07 DIAGNOSIS — N2581 Secondary hyperparathyroidism of renal origin: Secondary | ICD-10-CM | POA: Diagnosis present

## 2014-01-07 DIAGNOSIS — I509 Heart failure, unspecified: Secondary | ICD-10-CM | POA: Diagnosis present

## 2014-01-07 DIAGNOSIS — I442 Atrioventricular block, complete: Secondary | ICD-10-CM | POA: Diagnosis present

## 2014-01-07 DIAGNOSIS — E669 Obesity, unspecified: Secondary | ICD-10-CM

## 2014-01-07 DIAGNOSIS — D869 Sarcoidosis, unspecified: Secondary | ICD-10-CM | POA: Diagnosis present

## 2014-01-07 DIAGNOSIS — M899 Disorder of bone, unspecified: Secondary | ICD-10-CM | POA: Diagnosis present

## 2014-01-07 DIAGNOSIS — N186 End stage renal disease: Secondary | ICD-10-CM | POA: Diagnosis present

## 2014-01-07 DIAGNOSIS — I4819 Other persistent atrial fibrillation: Secondary | ICD-10-CM

## 2014-01-07 DIAGNOSIS — E872 Acidosis, unspecified: Secondary | ICD-10-CM | POA: Diagnosis present

## 2014-01-07 DIAGNOSIS — R7309 Other abnormal glucose: Secondary | ICD-10-CM | POA: Diagnosis present

## 2014-01-07 DIAGNOSIS — I4891 Unspecified atrial fibrillation: Secondary | ICD-10-CM | POA: Diagnosis not present

## 2014-01-07 DIAGNOSIS — E785 Hyperlipidemia, unspecified: Secondary | ICD-10-CM | POA: Diagnosis present

## 2014-01-07 DIAGNOSIS — N052 Unspecified nephritic syndrome with diffuse membranous glomerulonephritis: Secondary | ICD-10-CM | POA: Diagnosis present

## 2014-01-07 DIAGNOSIS — I120 Hypertensive chronic kidney disease with stage 5 chronic kidney disease or end stage renal disease: Secondary | ICD-10-CM | POA: Diagnosis present

## 2014-01-07 DIAGNOSIS — D649 Anemia, unspecified: Secondary | ICD-10-CM | POA: Diagnosis present

## 2014-01-07 DIAGNOSIS — T82898A Other specified complication of vascular prosthetic devices, implants and grafts, initial encounter: Secondary | ICD-10-CM | POA: Diagnosis present

## 2014-01-07 DIAGNOSIS — E875 Hyperkalemia: Secondary | ICD-10-CM | POA: Diagnosis present

## 2014-01-07 DIAGNOSIS — K5900 Constipation, unspecified: Secondary | ICD-10-CM | POA: Diagnosis not present

## 2014-01-07 DIAGNOSIS — Z992 Dependence on renal dialysis: Secondary | ICD-10-CM

## 2014-01-07 DIAGNOSIS — J9601 Acute respiratory failure with hypoxia: Secondary | ICD-10-CM

## 2014-01-07 DIAGNOSIS — R6510 Systemic inflammatory response syndrome (SIRS) of non-infectious origin without acute organ dysfunction: Secondary | ICD-10-CM | POA: Diagnosis present

## 2014-01-07 DIAGNOSIS — R001 Bradycardia, unspecified: Secondary | ICD-10-CM

## 2014-01-07 HISTORY — DX: Chronic nephritic syndrome with unspecified morphologic changes: N03.9

## 2014-01-07 HISTORY — DX: Cerebral palsy, unspecified: G80.9

## 2014-01-07 HISTORY — DX: Sarcoidosis, unspecified: D86.9

## 2014-01-07 HISTORY — DX: Recurrent and persistent hematuria with diffuse membranous glomerulonephritis: N02.2

## 2014-01-07 HISTORY — PX: TEMPORARY PACEMAKER INSERTION: SHX5471

## 2014-01-07 HISTORY — DX: Hyperlipidemia, unspecified: E78.5

## 2014-01-07 HISTORY — DX: Gastro-esophageal reflux disease without esophagitis: K21.9

## 2014-01-07 HISTORY — DX: Morbid (severe) obesity due to excess calories: E66.01

## 2014-01-07 HISTORY — DX: Anemia in chronic kidney disease: D63.1

## 2014-01-07 HISTORY — DX: End stage renal disease: N18.6

## 2014-01-07 HISTORY — DX: Secondary hyperparathyroidism of renal origin: N25.81

## 2014-01-07 LAB — COMPREHENSIVE METABOLIC PANEL
ALT: 12 U/L (ref 0–53)
ANION GAP: 25 — AB (ref 5–15)
AST: 16 U/L (ref 0–37)
Albumin: 3.9 g/dL (ref 3.5–5.2)
Alkaline Phosphatase: 226 U/L — ABNORMAL HIGH (ref 39–117)
BILIRUBIN TOTAL: 0.4 mg/dL (ref 0.3–1.2)
BUN: 106 mg/dL — AB (ref 6–23)
CHLORIDE: 95 meq/L — AB (ref 96–112)
CO2: 18 meq/L — AB (ref 19–32)
CREATININE: 15.14 mg/dL — AB (ref 0.50–1.35)
Calcium: 9.8 mg/dL (ref 8.4–10.5)
GFR calc Af Amer: 3 mL/min — ABNORMAL LOW (ref >90)
GFR, EST NON AFRICAN AMERICAN: 3 mL/min — AB (ref >90)
GLUCOSE: 167 mg/dL — AB (ref 70–99)
Potassium: 6.8 mEq/L (ref 3.7–5.3)
Sodium: 138 mEq/L (ref 137–147)
Total Protein: 8.1 g/dL (ref 6.0–8.3)

## 2014-01-07 LAB — CBC WITH DIFFERENTIAL/PLATELET
Basophils Absolute: 0 10*3/uL (ref 0.0–0.1)
Basophils Relative: 0 % (ref 0–1)
Eosinophils Absolute: 0.2 10*3/uL (ref 0.0–0.7)
Eosinophils Relative: 2 % (ref 0–5)
HCT: 29.1 % — ABNORMAL LOW (ref 39.0–52.0)
HEMOGLOBIN: 9.5 g/dL — AB (ref 13.0–17.0)
LYMPHS ABS: 3 10*3/uL (ref 0.7–4.0)
LYMPHS PCT: 31 % (ref 12–46)
MCH: 30.4 pg (ref 26.0–34.0)
MCHC: 32.6 g/dL (ref 30.0–36.0)
MCV: 93 fL (ref 78.0–100.0)
MONO ABS: 1 10*3/uL (ref 0.1–1.0)
Monocytes Relative: 11 % (ref 3–12)
Neutro Abs: 5.2 10*3/uL (ref 1.7–7.7)
Neutrophils Relative %: 56 % (ref 43–77)
Platelets: 242 10*3/uL (ref 150–400)
RBC: 3.13 MIL/uL — ABNORMAL LOW (ref 4.22–5.81)
RDW: 14.2 % (ref 11.5–15.5)
WBC: 9.5 10*3/uL (ref 4.0–10.5)

## 2014-01-07 LAB — PROTIME-INR
INR: 1.17 (ref 0.00–1.49)
Prothrombin Time: 14.9 seconds (ref 11.6–15.2)

## 2014-01-07 LAB — I-STAT ARTERIAL BLOOD GAS, ED
Acid-base deficit: 11 mmol/L — ABNORMAL HIGH (ref 0.0–2.0)
BICARBONATE: 14.7 meq/L — AB (ref 20.0–24.0)
O2 SAT: 87 %
TCO2: 16 mmol/L (ref 0–100)
pCO2 arterial: 32.2 mmHg — ABNORMAL LOW (ref 35.0–45.0)
pH, Arterial: 7.265 — ABNORMAL LOW (ref 7.350–7.450)
pO2, Arterial: 58 mmHg — ABNORMAL LOW (ref 80.0–100.0)

## 2014-01-07 LAB — I-STAT CHEM 8, ED
BUN: 123 mg/dL — ABNORMAL HIGH (ref 6–23)
Calcium, Ion: 1.16 mmol/L (ref 1.13–1.30)
Chloride: 105 mEq/L (ref 96–112)
Creatinine, Ser: 16.6 mg/dL — ABNORMAL HIGH (ref 0.50–1.35)
Glucose, Bld: 169 mg/dL — ABNORMAL HIGH (ref 70–99)
HCT: 31 % — ABNORMAL LOW (ref 39.0–52.0)
Hemoglobin: 10.5 g/dL — ABNORMAL LOW (ref 13.0–17.0)
Potassium: 6.6 mEq/L (ref 3.7–5.3)
Sodium: 134 mEq/L — ABNORMAL LOW (ref 137–147)
TCO2: 19 mmol/L (ref 0–100)

## 2014-01-07 LAB — CBG MONITORING, ED: Glucose-Capillary: 134 mg/dL — ABNORMAL HIGH (ref 70–99)

## 2014-01-07 LAB — GLUCOSE, CAPILLARY
Glucose-Capillary: 110 mg/dL — ABNORMAL HIGH (ref 70–99)
Glucose-Capillary: 118 mg/dL — ABNORMAL HIGH (ref 70–99)
Glucose-Capillary: 210 mg/dL — ABNORMAL HIGH (ref 70–99)

## 2014-01-07 LAB — POCT I-STAT 3, ART BLOOD GAS (G3+)
ACID-BASE DEFICIT: 9 mmol/L — AB (ref 0.0–2.0)
Bicarbonate: 17.9 mEq/L — ABNORMAL LOW (ref 20.0–24.0)
O2 Saturation: 96 %
TCO2: 19 mmol/L (ref 0–100)
pCO2 arterial: 39 mmHg (ref 35.0–45.0)
pH, Arterial: 7.267 — ABNORMAL LOW (ref 7.350–7.450)
pO2, Arterial: 93 mmHg (ref 80.0–100.0)

## 2014-01-07 LAB — MRSA PCR SCREENING: MRSA by PCR: NEGATIVE

## 2014-01-07 LAB — TROPONIN I: Troponin I: 0.33 ng/mL (ref <0.30)

## 2014-01-07 LAB — I-STAT TROPONIN, ED: Troponin i, poc: 0.01 ng/mL (ref 0.00–0.08)

## 2014-01-07 LAB — TRIGLYCERIDES: Triglycerides: 247 mg/dL — ABNORMAL HIGH (ref <150)

## 2014-01-07 SURGERY — Surgical Case

## 2014-01-07 SURGERY — TEMPORARY PACEMAKER INSERTION
Anesthesia: LOCAL

## 2014-01-07 MED ORDER — ATROPINE SULFATE 1 MG/ML IJ SOLN
INTRAMUSCULAR | Status: AC | PRN
  Administered 2014-01-07: 1 mg via INTRAVENOUS

## 2014-01-07 MED ORDER — SUCCINYLCHOLINE CHLORIDE 20 MG/ML IJ SOLN
INTRAMUSCULAR | Status: AC
  Filled 2014-01-07: qty 1

## 2014-01-07 MED ORDER — FENTANYL CITRATE 0.05 MG/ML IJ SOLN
INTRAMUSCULAR | Status: AC
  Filled 2014-01-07: qty 2

## 2014-01-07 MED ORDER — EPINEPHRINE HCL 0.1 MG/ML IJ SOSY
PREFILLED_SYRINGE | INTRAMUSCULAR | Status: AC
  Filled 2014-01-07: qty 10

## 2014-01-07 MED ORDER — MIDAZOLAM HCL 2 MG/2ML IJ SOLN
INTRAMUSCULAR | Status: AC
  Filled 2014-01-07: qty 4

## 2014-01-07 MED ORDER — MIDAZOLAM HCL 5 MG/5ML IJ SOLN
INTRAMUSCULAR | Status: DC | PRN
  Administered 2014-01-07: 4 mg via INTRAVENOUS

## 2014-01-07 MED ORDER — ROCURONIUM BROMIDE 50 MG/5ML IV SOLN
INTRAVENOUS | Status: AC
  Filled 2014-01-07: qty 2

## 2014-01-07 MED ORDER — HEPARIN SODIUM (PORCINE) 5000 UNIT/ML IJ SOLN
5000.0000 [IU] | Freq: Three times a day (TID) | INTRAMUSCULAR | Status: DC
  Administered 2014-01-07 – 2014-01-13 (×16): 5000 [IU] via SUBCUTANEOUS
  Administered 2014-01-13: 3000 [IU] via SUBCUTANEOUS
  Administered 2014-01-14 – 2014-01-15 (×3): 5000 [IU] via SUBCUTANEOUS
  Filled 2014-01-07 (×24): qty 1

## 2014-01-07 MED ORDER — HEPARIN SODIUM (PORCINE) 1000 UNIT/ML IJ SOLN
2400.0000 [IU] | Freq: Once | INTRAMUSCULAR | Status: AC
  Administered 2014-01-07: 2400 [IU] via INTRAVENOUS
  Filled 2014-01-07: qty 3

## 2014-01-07 MED ORDER — EPINEPHRINE HCL 0.1 MG/ML IJ SOSY
PREFILLED_SYRINGE | INTRAMUSCULAR | Status: DC | PRN
  Administered 2014-01-07: 0.5 mg via INTRAVENOUS
  Administered 2014-01-07: 1 mg via INTRAVENOUS

## 2014-01-07 MED ORDER — PROPOFOL 10 MG/ML IV EMUL
0.0000 ug/kg/min | INTRAVENOUS | Status: DC
  Administered 2014-01-07: 15 ug/kg/min via INTRAVENOUS
  Administered 2014-01-08: 25 ug/kg/min via INTRAVENOUS
  Administered 2014-01-08: 30 ug/kg/min via INTRAVENOUS
  Administered 2014-01-08 (×2): 35 ug/kg/min via INTRAVENOUS
  Administered 2014-01-08: 25 ug/kg/min via INTRAVENOUS
  Administered 2014-01-08: 30 ug/kg/min via INTRAVENOUS
  Administered 2014-01-09: 35 ug/kg/min via INTRAVENOUS
  Administered 2014-01-09: 20 ug/kg/min via INTRAVENOUS
  Administered 2014-01-09: 25 ug/kg/min via INTRAVENOUS
  Administered 2014-01-09: 30 ug/kg/min via INTRAVENOUS
  Administered 2014-01-10: 25 ug/kg/min via INTRAVENOUS
  Filled 2014-01-07 (×12): qty 100

## 2014-01-07 MED ORDER — MIDAZOLAM HCL 2 MG/2ML IJ SOLN
2.0000 mg | INTRAMUSCULAR | Status: DC | PRN
  Administered 2014-01-07: 2 mg via INTRAVENOUS

## 2014-01-07 MED ORDER — HEPARIN (PORCINE) 2000 UNITS/L FOR CRRT
INTRAVENOUS_CENTRAL | Status: DC | PRN
  Administered 2014-01-07: 19:00:00 via INTRAVENOUS_CENTRAL
  Filled 2014-01-07 (×2): qty 1000

## 2014-01-07 MED ORDER — SODIUM CHLORIDE 0.9 % IJ SOLN
250.0000 [IU]/h | INTRAMUSCULAR | Status: DC
  Administered 2014-01-07: 1600 [IU]/h via INTRAVENOUS_CENTRAL
  Administered 2014-01-07: 1200 [IU]/h via INTRAVENOUS_CENTRAL
  Administered 2014-01-07: 250 [IU]/h via INTRAVENOUS_CENTRAL
  Administered 2014-01-07: 1000 [IU]/h via INTRAVENOUS_CENTRAL
  Administered 2014-01-07: 1400 [IU]/h via INTRAVENOUS_CENTRAL
  Administered 2014-01-08: 2700 [IU]/h via INTRAVENOUS_CENTRAL
  Administered 2014-01-08: 2900 [IU]/h via INTRAVENOUS_CENTRAL
  Administered 2014-01-08 (×2): 3000 [IU]/h via INTRAVENOUS_CENTRAL
  Administered 2014-01-08: 2300 [IU]/h via INTRAVENOUS_CENTRAL
  Administered 2014-01-08: 2800 [IU]/h via INTRAVENOUS_CENTRAL
  Administered 2014-01-08: 2600 [IU]/h via INTRAVENOUS_CENTRAL
  Administered 2014-01-08: 2000 [IU]/h via INTRAVENOUS_CENTRAL
  Administered 2014-01-08: 2500 [IU]/h via INTRAVENOUS_CENTRAL
  Administered 2014-01-08: 2200 [IU]/h via INTRAVENOUS_CENTRAL
  Administered 2014-01-08: 1800 [IU]/h via INTRAVENOUS_CENTRAL
  Administered 2014-01-08: 2400 [IU]/h via INTRAVENOUS_CENTRAL
  Administered 2014-01-08: 3000 [IU]/h via INTRAVENOUS_CENTRAL
  Administered 2014-01-08: 2200 [IU]/h via INTRAVENOUS_CENTRAL
  Administered 2014-01-09 – 2014-01-11 (×8): 3000 [IU]/h via INTRAVENOUS_CENTRAL
  Filled 2014-01-07 (×13): qty 2

## 2014-01-07 MED ORDER — CHLORHEXIDINE GLUCONATE 0.12 % MT SOLN
15.0000 mL | Freq: Two times a day (BID) | OROMUCOSAL | Status: DC
  Administered 2014-01-07 – 2014-01-10 (×6): 15 mL via OROMUCOSAL
  Filled 2014-01-07 (×6): qty 15

## 2014-01-07 MED ORDER — ALBUTEROL SULFATE (2.5 MG/3ML) 0.083% IN NEBU: 2.5000 mg | INHALATION_SOLUTION | RESPIRATORY_TRACT | Status: DC | PRN

## 2014-01-07 MED ORDER — SODIUM CHLORIDE 0.9 % IV SOLN
INTRAVENOUS | Status: AC | PRN
  Administered 2014-01-07: 150 mL/h via INTRAVENOUS

## 2014-01-07 MED ORDER — MIDAZOLAM HCL 2 MG/2ML IJ SOLN
2.0000 mg | INTRAMUSCULAR | Status: DC | PRN
  Administered 2014-01-08: 2 mg via INTRAVENOUS
  Filled 2014-01-07: qty 2

## 2014-01-07 MED ORDER — PRISMASOL BGK 4/2.5 32-4-2.5 MEQ/L IV SOLN
INTRAVENOUS | Status: DC
  Administered 2014-01-07 – 2014-01-08 (×7): via INTRAVENOUS_CENTRAL
  Filled 2014-01-07 (×13): qty 5000

## 2014-01-07 MED ORDER — LIDOCAINE HCL (CARDIAC) 20 MG/ML IV SOLN
INTRAVENOUS | Status: AC
  Filled 2014-01-07: qty 5

## 2014-01-07 MED ORDER — CETYLPYRIDINIUM CHLORIDE 0.05 % MT LIQD
7.0000 mL | Freq: Four times a day (QID) | OROMUCOSAL | Status: DC
  Administered 2014-01-07 – 2014-01-10 (×11): 7 mL via OROMUCOSAL

## 2014-01-07 MED ORDER — DEXTROSE 50 % IV SOLN
INTRAVENOUS | Status: DC | PRN
  Administered 2014-01-07: 50 mL via INTRAVENOUS

## 2014-01-07 MED ORDER — ETOMIDATE 2 MG/ML IV SOLN
INTRAVENOUS | Status: DC | PRN
  Administered 2014-01-07: 15 mg via INTRAVENOUS

## 2014-01-07 MED ORDER — HEPARIN BOLUS VIA INFUSION (CRRT)
1000.0000 [IU] | INTRAVENOUS | Status: DC | PRN
  Administered 2014-01-07 – 2014-01-08 (×7): 1000 [IU] via INTRAVENOUS_CENTRAL
  Filled 2014-01-07: qty 1000

## 2014-01-07 MED ORDER — PANTOPRAZOLE SODIUM 40 MG IV SOLR
40.0000 mg | INTRAVENOUS | Status: DC
  Administered 2014-01-08 – 2014-01-10 (×3): 40 mg via INTRAVENOUS
  Filled 2014-01-07 (×4): qty 40

## 2014-01-07 MED ORDER — SODIUM CHLORIDE 0.9 % IV SOLN: 1.0000 g | Freq: Once | INTRAVENOUS | Status: DC

## 2014-01-07 MED ORDER — ETOMIDATE 2 MG/ML IV SOLN
INTRAVENOUS | Status: AC
  Filled 2014-01-07: qty 20

## 2014-01-07 MED ORDER — INSULIN ASPART 100 UNIT/ML IV SOLN
10.0000 [IU] | Freq: Once | INTRAVENOUS | Status: AC
  Administered 2014-01-07: 10 [IU] via INTRAVENOUS

## 2014-01-07 MED ORDER — ATROPINE SULFATE 1 MG/ML IJ SOLN
INTRAMUSCULAR | Status: DC | PRN
  Administered 2014-01-07: 1 mg via INTRAVENOUS

## 2014-01-07 MED ORDER — SODIUM CHLORIDE 0.9 % IV SOLN
INTRAVENOUS | Status: DC
  Administered 2014-01-07 (×2): via INTRAVENOUS

## 2014-01-07 MED ORDER — FENTANYL CITRATE 0.05 MG/ML IJ SOLN
INTRAMUSCULAR | Status: DC | PRN
  Administered 2014-01-07: 100 ug via INTRAVENOUS

## 2014-01-07 MED ORDER — PRISMASOL BGK 0/2.5 32-2.5 MEQ/L IV SOLN
INTRAVENOUS | Status: DC
  Administered 2014-01-07 – 2014-01-09 (×4): via INTRAVENOUS_CENTRAL
  Filled 2014-01-07 (×6): qty 5000

## 2014-01-07 MED ORDER — MIDAZOLAM HCL 2 MG/2ML IJ SOLN
1.0000 mg | INTRAMUSCULAR | Status: DC | PRN
  Administered 2014-01-07 (×2): 2 mg via INTRAVENOUS
  Filled 2014-01-07 (×3): qty 2

## 2014-01-07 MED ORDER — DOPAMINE-DEXTROSE 3.2-5 MG/ML-% IV SOLN
2.0000 ug/kg/min | Freq: Once | INTRAVENOUS | Status: DC
  Filled 2014-01-07: qty 250

## 2014-01-07 MED ORDER — CALCIUM CHLORIDE 10 % IV SOLN
1.0000 g | Freq: Once | INTRAVENOUS | Status: AC
  Administered 2014-01-07: 1 g via INTRAVENOUS

## 2014-01-07 MED ORDER — ROCURONIUM BROMIDE 50 MG/5ML IV SOLN
INTRAVENOUS | Status: DC | PRN
  Administered 2014-01-07: 100 mg via INTRAVENOUS

## 2014-01-07 MED ORDER — HEPARIN SODIUM (PORCINE) 1000 UNIT/ML DIALYSIS
1000.0000 [IU] | INTRAMUSCULAR | Status: DC | PRN
  Filled 2014-01-07: qty 6
  Filled 2014-01-07: qty 3

## 2014-01-07 MED ORDER — FENTANYL CITRATE 0.05 MG/ML IJ SOLN
100.0000 ug | INTRAMUSCULAR | Status: DC | PRN
  Administered 2014-01-07 – 2014-01-10 (×4): 100 ug via INTRAVENOUS
  Filled 2014-01-07 (×3): qty 2

## 2014-01-07 MED ORDER — NOREPINEPHRINE BITARTRATE 1 MG/ML IV SOLN
2.0000 ug/min | Freq: Once | INTRAVENOUS | Status: DC
  Filled 2014-01-07: qty 4

## 2014-01-07 MED ORDER — INSULIN ASPART 100 UNIT/ML ~~LOC~~ SOLN
2.0000 [IU] | SUBCUTANEOUS | Status: DC
  Administered 2014-01-08 (×2): 2 [IU] via SUBCUTANEOUS

## 2014-01-07 MED ORDER — PRISMASOL BGK 0/2.5 32-2.5 MEQ/L IV SOLN
INTRAVENOUS | Status: DC
  Administered 2014-01-07 – 2014-01-09 (×4): via INTRAVENOUS_CENTRAL
  Filled 2014-01-07 (×6): qty 5000

## 2014-01-07 MED ORDER — DOPAMINE-DEXTROSE 3.2-5 MG/ML-% IV SOLN
INTRAVENOUS | Status: DC | PRN
  Administered 2014-01-07: 8.023 ug/kg/min via INTRAVENOUS
  Administered 2014-01-07: 5 ug/kg/min via INTRAVENOUS

## 2014-01-07 MED ORDER — FENTANYL CITRATE 0.05 MG/ML IJ SOLN
25.0000 ug | INTRAMUSCULAR | Status: DC | PRN
  Administered 2014-01-07: 100 ug via INTRAVENOUS
  Filled 2014-01-07 (×2): qty 2

## 2014-01-07 MED ORDER — LIDOCAINE HCL (PF) 1 % IJ SOLN
INTRAMUSCULAR | Status: AC
  Filled 2014-01-07: qty 30

## 2014-01-07 MED FILL — Medication: Qty: 1 | Status: AC

## 2014-01-07 NOTE — Code Documentation (Signed)
Pt had repeat episode of seizure like activbity with heart rate dropping to zero,  Given atropine and calcium and introduce placed at right groin.

## 2014-01-07 NOTE — Consult Note (Addendum)
CARDIOLOGY CONSULT NOTE  Patient ID: Andrew [name] MRN: [id number] DOB/AGE: 1951/03/21 63 y.o.  Admit date: 01/07/2014 Primary Physician:  No PCP Per Patient Reason for Consultation  Heart block  HPI: Patient is a 63 year old African American male, not much history is obtainable as patient is obtunded, I was called on a stat basis for evaluation of heart block. Patient has history of end-stage lung disease and is on hemodialysis in the [facility]. This morning he went to the dialysis center, it was unable to be dialyzed and he also complained about chest. He was found to be markedly bradycardic, EMS was activated, therefore into the in severe bradycardia, initially EKG was read as acute myocardial infarction, STEMI was activated. However on review of the EKG, patient was essentially right branch block. He had junctional escape rhythm. Patient was hypotensive and bradycardic and I saw him. Patient dropped his heart rate to 19 beats per minute the client, necessitating need for epinephrine administration. Patient was on dopamine 10 mcg per kilogram per minute.  History mostly obtained from charts and from the emergency department physicians and nurses. Patient did receive therapy for hyperkalemia.   Past Medical History  Diagnosis Date  . Renal disorder   . Coronary artery disease      History reviewed. No pertinent past surgical history.   History reviewed. No pertinent family history.   Social History: History   Social History  . Marital Status: Single    Spouse Name: N/A    Number of Children: N/A  . Years of Education: N/A   Occupational History  . Not on file.   Social History Main Topics  . Smoking status: Not on file  . Smokeless tobacco: Not on file  . Alcohol Use: Not on file  . Drug Use: Not on file  . Sexual Activity: Not on file   Other Topics Concern  . Not on file   Social History Narrative  . No narrative on file     No prescriptions prior to  admission    Scheduled Meds: . [facility] HOLD] antiseptic oral rinse  7 mL Mouth Rinse QID  . [facility] HOLD] chlorhexidine  15 mL Mouth Rinse BID  . [facility] HOLD] DOPamine  2-20 mcg/kg/min Intravenous Once  . etomidate      . fentaNYL      . [facility] HOLD] heparin subcutaneous  5,000 Units Subcutaneous 3 times per day  . lidocaine (cardiac) 100 mg/38ml      . midazolam      . [[facility] HOLD] norepinephrine (LEVOPHED) Adult infusion  2-50 mcg/min Intravenous Once  . [[facility] HOLD] pantoprazole (PROTONIX) IV  40 mg Intravenous Q24H  . rocuronium      . succinylcholine       Continuous Infusions: . [facility] HOLD] sodium chloride 150 mL/hr (01/07/14 1245)  . sodium chloride    . [facility] HOLD] DOPamine 5 mcg/kg/min (01/07/14 1318)   PRN Meds:.[[facility] HOLD] sodium chloride, [MAR HOLD] albuterol, [MAR HOLD] atropine, [MAR HOLD] dextrose, [MAR HOLD] DOPamine, [MAR HOLD] EPINEPHrine, [MAR HOLD] etomidate, [MAR HOLD] fentaNYL, fentaNYL, midazolam, [MAR HOLD] midazolam, [MAR HOLD] rocuronium  ROS: Patient complains of shortness of breath. Not much history obtainable as patient is in distress.    Physical Exam: Blood pressure 90/68, pulse 66, temperature 97.5 F (36.4 C), temperature source Oral, resp. rate 16, weight 115.001 kg (253 lb 8.5 oz), SpO2 100.00%.   General appearance: alert, appears stated age, distracted, moderate distress and slowed mentation Lungs: clear to auscultation  bilaterally Heart: Distant heart sounds, Abdomen: soft, non-tender; bowel sounds normal; no masses,  no organomegaly and Large pannus present Extremities: extremities normal, atraumatic, no cyanosis or edema and Left forearm arteriovenous fistula nonfunctional Pulses: Carotid pulse and femoral pulse during the time of stability well felt. Pedal pulses faint.  Labs:   Lab Results  Component Value Date   WBC 9.5 01/07/2014   HGB 9.5* 01/07/2014   HCT 29.1* 01/07/2014   MCV 93.0 01/07/2014   PLT 242 01/07/2014    Recent Labs Lab  01/07/14 1243  NA 138  K 6.8*  CL 95*  CO2 18*  BUN 106*  CREATININE 15.14*  CALCIUM 9.8  PROT 8.1  BILITOT 0.4  ALKPHOS 226*  ALT 12  AST 16  GLUCOSE 167*   No results found for this basename: CKTOTAL, CKMB, CKMBINDEX, TROPONINI    Lipid Panel  No results found for this basename: chol, trig, hdl, cholhdl, vldl, ldlcalc    EKG: Field EKG demonstrates junctional escape rhythm, underlying right bundle branch block. EKG in the emergency room initially revealed what appears like atypical atrial flutter with 3:1 conduction with underlying right bundle branch block , EKG post epinephrine administration revealed what appears like atrial flutter with 2-1 conduction with underlying right bundle branch block   Radiology: Dg Chest Portable 1 View  01/07/2014   CLINICAL DATA:  Chest pain  EXAM: PORTABLE CHEST - 1 VIEW  COMPARISON:  PA and lateral chest x-ray of July 31, 2007  FINDINGS: The lungs are adequately inflated. The interstitial markings are increased bilaterally. There is small amount of fluid in the minor fissure on the right. The cardiopericardial silhouette is enlarged. The pulmonary vascularity is engorged. The mediastinum is top normal in width. External pacemaker pads are present.  IMPRESSION: The findings are consistent with congestive heart failure and pulmonary interstitial edema. There is no focal pneumonia.   Electronically Signed   By: David  Swazil[place]Jordan   On: 01/07/2014 12:57    ASSESSMENT AND PLAN:  1. Complete heart block, sinus arrest, junctional escape rhythm, inadequate to maintain perfusion. Suspect metabolic issues to be the etiology. Bedside echocardiogram performed in the emergency room revealed preserved left ventricular and right ventricular systolic function, no evidence of pericardial effusion.  Recommendation: The patient was in the trauma room, due to worsening respiratory distress, he was intubated. I will take him to the cardiac catheterization lab to place  temporary transvenous pacemaker for stabilization. Continue to follow along with medical team with regard to making recommendations regarding cardiac issues.   Pamella PertGANJI,JAGADEESH R, MD 01/07/2014, 2:31 PM [facility] [place] Pager: [phone] Office: [phone] If no answer Cell [phone]

## 2014-01-07 NOTE — ED Notes (Signed)
Dr ray at bedside for attempt of introducer to right groin

## 2014-01-07 NOTE — CV Procedure (Signed)
Procedure performed: Ultrasound guided placement of temporary transvenous pacemaker via left femoral venous access.  Indication: Patient presenting with cardiogenic shock due to complete heart block from metabolic acidosis, chronic renal failure on hemodialysis and hyperkalemia hemodynamic compromise.  Under sterile precautions using left femoral venous access that was obtained via ultrasound guidance due to morbid obesity I had difficulty with accessing the vein. However with ultrasound guidance immediate access was obtained, a temporary transvenous balloontipped catheter was then advanced via a 6 French sheath and the tip of the pacemaker catheter was advanced into the right ventricle under fluoroscopic guidance and placed in the tip of the right ventricle. Immediate capture was obtained.  Ventricular capture was obtained with MA of 1.0 mA. Temporary pacemaker settings were heart rate of 80 beats per minute, V paced at 5 MA. There was no immediate complication.

## 2014-01-07 NOTE — Code Documentation (Signed)
Critical care, dr Nadara Eatongangi, dr ray and dr Katrinka Blazingsmith at bedside, consulting on patient.

## 2014-01-07 NOTE — Procedures (Signed)
Arterial Catheter Insertion Procedure Note Andrew [name] [id number] 11-26-1950  Procedure: Insertion of Arterial Catheter  Indications: Blood pressure monitoring  Procedure Details Consent: Unable to obtain consent because of altered level of consciousness. Time Out: Verified patient identification, verified procedure, site/side was marked, verified correct patient position, special equipment/implants available, medications/allergies/relevent history reviewed, required imaging and test results available.  Performed  Maximum sterile technique was used including antiseptics, cap, gloves, gown, hand hygiene, mask and sheet. Skin prep: Chlorhexidine; local anesthetic administered 20 gauge catheter was inserted into right radial artery using the Seldinger technique.  Evaluation Blood flow good; BP tracing good. Complications: No apparent complications.   Andrew [name], Andrew [name] 01/07/2014

## 2014-01-07 NOTE — ED Provider Notes (Addendum)
MSE was initiated and I personally evaluated the patient and placed orders (if any) at  1:03 PM on January 07, 2014.  63 y.o. Male m,w,f complaining of chest pain today at dialysis.  Dialysis catheter clotted.  Patient [facility] and initially called as stemi.  On presentation here patient continues with some chest tightness, ekg prehospital reviewed by me and no stemi although severe bradycardia.  Patient placed on monitor and had symptomatic bradycardia with tonic clonic activity resolving immediately upon hr increasing.  Emergent right femoral vein introducer sheath placed by me and calcium iv and atropine pushed.  Patient with hr increased to 30s.  Initial potassium 6.6.  Patient continued with bradycardia and external pacer placed.  BP initially hypertensive then becoming hypotensive norepi drip started.     PE  Chronically ill appearing male with intermittent bradycardia and episodes of unresponsiveness. cv- bradycardia Lungs- decreased bs bilaterally  Nephrology consulted and Dr. Arrie Aranoladonato will see.   Cardiology and critical care consulted and at bedside.  Patient to go to cath lab for pacer placement.   Patient intubated prior to this.  Please see Dr. Michaelle CopasSmith'[name] note.  I placed central line Right femoral line placement Right groin prepped and draped in usual sterile fashion Right femoral vein accessed and introducer sheath placed over wire with good blood return and flushed.   I performed a history and physical examination of Andrew [name] and discussed his management with Dr. Katrinka BlazingSmith.  I agree with the history, physical, assessment, and plan of care, with the following exceptions: None  I was present for the following procedures: intubation Time Spent in Critical Care of the patient: 75 Time spent in discussions with the patient and family: 3020  Andrew [name]    Hilario Quarryanielle [name] Meika Earll, MD 01/07/14 1355  Hilario Quarryanielle [name] Coen Miyasato, MD 01/14/14 [phone]

## 2014-01-07 NOTE — ED Notes (Signed)
Verbal order given by Italychad for chem8 and troponin istat for patient.

## 2014-01-07 NOTE — Progress Notes (Signed)
CRITICAL VALUE ALERT  Critical value received:  Troponin 0.33  Date of notification: 01/07/2014  Time of notification:  2230  Critical value read back:Yes.    Nurse who received alert:  Rocky LinkGeorgianna Larell Baney RN  MD notified (1st page):  Dr. Inis SizerBryum Pola Corn( ELink)  Time of first page: 2230  No new orders received.

## 2014-01-07 NOTE — ED Notes (Signed)
Dr ray called to bedside, pt having breif seizure activity

## 2014-01-07 NOTE — Progress Notes (Signed)
Utilization Review Completed.Andrew [name] T8/19/2015  

## 2014-01-07 NOTE — Code Documentation (Signed)
7.5 tube placed

## 2014-01-07 NOTE — ED Notes (Signed)
Critical labs given to dr. Rosalia Hammersay.

## 2014-01-07 NOTE — Code Documentation (Signed)
Pt started on transcutaneous pacing per dr Milas Gainkari smith. At 18 mA and 36 bpm.

## 2014-01-07 NOTE — ED Provider Notes (Signed)
CSN: [id number]     Arrival date & time 01/07/14  1204 History   None    Chief Complaint  Patient presents with  . Bradycardia   Andrew [name] is a 63 yo AAM w/PMH of CHF and ESRD on HD MWF who presents today by EMS for abnormal cardiac rhythm. Patient was at dialysis Center today and his left arm fistula was found to be clotted off. At this time, pt began to feel unwell: weak and sweaty with shortness of breath. EMS was called and on their initial cardiac strip they saw bradycardia and possible STEMI. In route patient was given aspirin 324 but no peripheral access was obtained. Patient continues to endorse shortness of breath, feeling unwell, chest heaviness. He has not missed any dialysis sessions.  (Consider location/radiation/quality/duration/timing/severity/associated sxs/prior Treatment) Patient is a 63 y.o. male presenting with chest pain. The history is provided by the patient.  Chest Pain Pain location:  Substernal area Pain quality: pressure   Pain radiates to:  Does not radiate Pain radiates to the back: no   Pain severity:  Moderate Onset quality:  Gradual Timing:  Constant Progression:  Worsening Chronicity:  New Context: breathing   Relieved by:  Nothing Worsened by:  Nothing tried Associated symptoms: diaphoresis, dizziness, nausea, near-syncope and shortness of breath   Associated symptoms: no abdominal pain, no back pain, no cough, no fever, no headache, no palpitations and not vomiting   Risk factors: diabetes mellitus     Past Medical History  Diagnosis Date  . End stage renal disease   . Coronary artery disease   . Obesity, morbid   . GERD (gastroesophageal reflux disease)   . Cerebral palsy   . Sarcoidosis   . Hyperlipidemia   . Hypertension secondary to other renal disorders   . Secondary hyperparathyroidism (of renal origin)   . Anemia in chronic kidney disease(285.21)   . 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 No  pertinent past surgical history. History reviewed. No pertinent family history. History  Substance Use Topics  . Smoking status: Not on file  . Smokeless tobacco: Not on file  . Alcohol Use: Not on file    Review of Systems  Constitutional: Positive for diaphoresis. Negative for fever and chills.  Respiratory: Positive for shortness of breath. Negative for cough.   Cardiovascular: Positive for chest pain and near-syncope. Negative for palpitations and leg swelling.  Gastrointestinal: Positive for nausea. Negative for vomiting, abdominal pain, diarrhea, constipation and abdominal distention.  Genitourinary: Negative for dysuria, frequency, flank pain and decreased urine volume.  Musculoskeletal: Negative for back pain.  Neurological: Positive for dizziness. Negative for speech difficulty, light-headedness and headaches.  All other systems reviewed and are negative.     Allergies  Review of patient's allergies indicates not on file.  Home Medications   Prior to Admission medications   Not on File   BP 107/60  Pulse 55  Temp(Src) 97.5 F (36.4 C) (Oral)  Resp 38  SpO2 100% Physical Exam  Nursing note and vitals reviewed. Constitutional: He is oriented to person, place, and time. He appears well-developed and well-nourished. He appears distressed.  HENT:  Head: Normocephalic and atraumatic.  Eyes: Pupils are equal, round, and reactive to light.  Neck: Normal range of motion.  Cardiovascular: Intact distal pulses.  Exam reveals no gallop and no friction rub.   No murmur heard. Pulmonary/Chest: Effort normal and breath sounds normal. No respiratory distress. He has no wheezes. He has no  rales. He exhibits no tenderness.  Abdominal: Soft. Bowel sounds are normal. He exhibits no distension and no mass. There is no tenderness. There is no rebound and no guarding.  Musculoskeletal: Normal range of motion.  Lymphadenopathy:    He has no cervical adenopathy.  Neurological: He is  alert and oriented to person, place, and time.  Skin: Skin is warm and dry. He is not diaphoretic.    ED Course  INTUBATION Date/Time: 01/07/2014 5:36 PM Performed by: Rachelle Hora Authorized by: Rachelle Hora Consent: The procedure was performed in an emergent situation. Indications: respiratory distress Intubation method: video-assisted Patient status: paralyzed (RSI) Preoxygenation: nonrebreather mask Sedatives: etomidate Paralytic: rocuronium Laryngoscope size: Mac 4 Tube size: 7.5 mm Tube type: cuffed Number of attempts: 1 Cords visualized: yes Post-procedure assessment: chest rise and CO2 detector Breath sounds: equal and absent over the epigastrium Cuff inflated: yes ETT to lip: 25 cm Tube secured with: ETT holder Patient tolerance: Patient tolerated the procedure well with no immediate complications.   (including critical care time) Labs Review Labs Reviewed  CBC WITH DIFFERENTIAL - Abnormal; Notable for the following:    RBC 3.13 (*)    Hemoglobin 9.5 (*)    HCT 29.1 (*)    All other components within normal limits  COMPREHENSIVE METABOLIC PANEL - Abnormal; Notable for the following:    Potassium 6.8 (*)    Chloride 95 (*)    CO2 18 (*)    Glucose, Bld 167 (*)    BUN 106 (*)    Creatinine, Ser 15.14 (*)    Alkaline Phosphatase 226 (*)    GFR calc non Af Amer 3 (*)    GFR calc Af Amer 3 (*)    Anion gap 25 (*)    All other components within normal limits  GLUCOSE, CAPILLARY - Abnormal; Notable for the following:    Glucose-Capillary 210 (*)    All other components within normal limits  I-STAT CHEM 8, ED - Abnormal; Notable for the following:    Sodium 134 (*)    Potassium 6.6 (*)    BUN 123 (*)    Creatinine, Ser 16.60 (*)    Glucose, Bld 169 (*)    Hemoglobin 10.5 (*)    HCT 31.0 (*)    All other components within normal limits  CBG MONITORING, ED - Abnormal; Notable for the following:    Glucose-Capillary 134 (*)    All other components within  normal limits  I-STAT ARTERIAL BLOOD GAS, ED - Abnormal; Notable for the following:    pH, Arterial 7.265 (*)    pCO2 arterial 32.2 (*)    pO2, Arterial 58.0 (*)    Bicarbonate 14.7 (*)    Acid-base deficit 11.0 (*)    All other components within normal limits  POCT I-STAT 3, ART BLOOD GAS (G3+) - Abnormal; Notable for the following:    pH, Arterial 7.267 (*)    Bicarbonate 17.9 (*)    Acid-base deficit 9.0 (*)    All other components within normal limits  MRSA PCR SCREENING  PROTIME-INR  TROPONIN I  BLOOD GAS, ARTERIAL  TROPONIN I  TROPONIN I  BLOOD GAS, ARTERIAL  RENAL FUNCTION PANEL  CBC  RENAL FUNCTION PANEL  MAGNESIUM  APTT  I-STAT TROPOININ, ED    Imaging Review Dg Chest Port 1 View  01/07/2014   CLINICAL DATA:  HD catheter and nasogastric tube placement  EXAM: PORTABLE CHEST - 1 VIEW  COMPARISON:  Prior chest x-ray earlier today  at 12:47 p.m.  FINDINGS: Interval intubation. The tip of the ET tube is 1.6 cm above the carina. A right IJ approach central venous catheter has been placed. The tip overlies the upper SVC. A nasogastric tube is present. The proximal side hole can be identified overlying the gastric fundus. No evidence of pneumothorax or large hemothorax. Stable cardiomegaly. Improving pulmonary edema. Persistent bibasilar and right mid lung atelectasis. External defibrillator pads again per just over the left chest. Femoral approach cardiac rhythm maintenance device may represent a transvenous pacer.  IMPRESSION: 1. Interval intubation. The tip of the ET tube is 1.6 cm above the carina. 2. New right IJ temporary hemodialysis catheter with the tip in the upper SVC. No evidence of complicating pneumothorax. 3. The tip of the nasogastric tube is within the stomach. 4. Probable femoral approach transvenous pacing lead. The lead projects over the right ventricle. 5. Slightly improved pulmonary edema.   Electronically Signed   By: Malachy MoanHeath  McCullough M.D.   On: 01/07/2014 16:41    Dg Chest Portable 1 View  01/07/2014   CLINICAL DATA:  Chest pain  EXAM: PORTABLE CHEST - 1 VIEW  COMPARISON:  PA and lateral chest x-ray of July 31, 2007  FINDINGS: The lungs are adequately inflated. The interstitial markings are increased bilaterally. There is small amount of fluid in the minor fissure on the right. The cardiopericardial silhouette is enlarged. The pulmonary vascularity is engorged. The mediastinum is top normal in width. External pacemaker pads are present.  IMPRESSION: The findings are consistent with congestive heart failure and pulmonary interstitial edema. There is no focal pneumonia.   Electronically Signed   By: David  [place]Jordan   On: 01/07/2014 12:57     EKG Interpretation None      MDM   63 year-old AAM who presents today with bradycardia. Please see history of present illness for details. PTA, ASA given. Cardiac strip from EMS reviewed and reveals extreme bradycardia with rates in the 30s and 20s. No sign of STEMI. Code STEMI canceled. Patient immediately hooked up to O2, and cardiac monitor. Patient continues to be in severe bradycardia. Satting well on nonrebreather. His bradycardic rhythm is deathly sinus with a narrow QRS, no sign of sin wave. However, given ESRD, possible electrolyte abnormality. W/no peripheral access, pt had right femoral cordis placed. Pacer pads placed.   Patient given atropine which increased his heart rate from the 30s up to the 90s. Blood pressure stable. Chest x-ray  reveals signs of volume overload with pulmonary edema.   Potassium should be elevated over 6.6. Given calcium gluconate, D50 and insulin.  After about 30 minutes patient began to feel worse with worsening shortness of breath, diaphoresis, chest pressure. Cardiac monitor still shows heart rate in the 30s. Began externally pacing. Hypotensive. Atropine pushed, with little improvement in HR. HR in the 30s. Consulted intensivist and cardiology. Started on levophed drip.    Hypotensive w/systolics dropping to the 40s. External pacing not successful, likely due to trouble capturing due to large habitus. Pacer turned off. Dopamine gtt initiated which improved his systolics to the 90s. Patient becoming hypoxic with a pulse ox showing 70s and 80s. An amp of epi pushed and HR, BP, and oxygen sats improved.   It was decided at this time to intubate the patient. Please see procedure note above.   Patient was immediately taken to cardiac lab for pacemaker implant. Please see cardiology and I sees notes for further details regarding the remainder of this patient's hospital  course.  Final diagnoses:  Hyperkalemia  Acute respiratory failure with hypoxemia  Bradycardia  Cardiogenic shock  ESRD (end stage renal disease)    Pt was seen under the supervision of Dr. Rosalia Hammers.     Rachelle Hora, MD 01/07/14 1739

## 2014-01-07 NOTE — Code Documentation (Signed)
All MD's at bedside, continuiing to consult,

## 2014-01-07 NOTE — H&P (Signed)
PULMONARY / CRITICAL CARE MEDICINE   Name: Andrew DiegoHerbert B [name] MRN: [id number] DOB: 03/27/1951    ADMISSION DATE:  01/07/2014  REFERRING MD :  EDP  CHIEF COMPLAINT:  Bradycardia, SOB  INITIAL PRESENTATION: 63 yo male with hx ESRD, CAD admitted 8/19 with severe symptomatic bradycardia and dyspnea.   STUDIES:  2D echo 8/19>>>  SIGNIFICANT EVENTS:    HISTORY OF PRESENT ILLNESS:  63yo male with hx ESRD, CAD presented 8/19 with chest pain.  Presented for his usual HD but his graft was clotted.  He began c/o chest pain and SOB and was found to be significantly bradycardic, hyperkalemic and volume overloaded.  Was sent to ER as STEMI initially which was cancelled.  He had symptomatic bradycardia with syncope x2 and required external pacing. Became increasingly dyspneic and lost capture from external pacer.  Seen by Dr. Nadara EatonGangi with plans for emergent cath lab for pacer placement.    PAST MEDICAL HISTORY :  Past Medical History  Diagnosis Date  . Renal disorder   . Coronary artery disease    History reviewed. No pertinent past surgical history. Prior to Admission medications   Not on File   Not on File  FAMILY HISTORY:  History reviewed. No pertinent family history. SOCIAL HISTORY:  has no tobacco, alcohol, and drug history on file.  REVIEW OF SYSTEMS:  C/o chest tightness, SOB, lightheadedness.  Denies fevers, cough, hemoptysis, abd pain, leg/calf pain.    SUBJECTIVE:   VITAL SIGNS: Temp:  [97.5 F (36.4 C)] 97.5 F (36.4 C) (08/19 1218) Pulse Rate:  [49-107] 65 (08/19 1317) Resp:  [28-38] 34 (08/19 1317) BP: (71-168)/(52-93) 90/68 mmHg (08/19 1317) SpO2:  [84 %-100 %] 84 % (08/19 1317) Weight:  [253 lb 8.5 oz (115.001 kg)] 253 lb 8.5 oz (115.001 kg) (08/19 1318) HEMODYNAMICS:   VENTILATOR SETTINGS:    INTAKE / OUTPUT: No intake or output data in the 24 hours ending 01/07/14 1354  PHYSICAL EXAMINATION: General:  Obese, chronically ill appearing male, acutely ill   Neuro:  Awake, intermittent syncope/near syncope, MAE, follows commands  HEENT:  Mm dry, no JVD Cardiovascular:  S1s2, severe bradycardia (20's), distant  Lungs:  resps even, mildly labored, diminished, few scattered rales  Abdomen:  Obese, soft, +bs Musculoskeletal:  Cool, clammy, scant BLE edema    LABS:  CBC  Recent Labs Lab 01/07/14 1241 01/07/14 1243  WBC  --  9.5  HGB 10.5* 9.5*  HCT 31.0* 29.1*  PLT  --  242   Coag's  Recent Labs Lab 01/07/14 1243  INR 1.17   BMET  Recent Labs Lab 01/07/14 1241  NA 134*  K 6.6*  CL 105  BUN 123*  CREATININE 16.60*  GLUCOSE 169*   Electrolytes No results found for this basename: CALCIUM, MG, PHOS,  in the last 168 hours Sepsis Markers No results found for this basename: LATICACIDVEN, PROCALCITON, O2SATVEN,  in the last 168 hours ABG  Recent Labs Lab 01/07/14 1333  PHART 7.265*  PCO2ART 32.2*  PO2ART 58.0*   Liver Enzymes No results found for this basename: AST, ALT, ALKPHOS, BILITOT, ALBUMIN,  in the last 168 hours Cardiac Enzymes No results found for this basename: TROPONINI, PROBNP,  in the last 168 hours Glucose  Recent Labs Lab 01/07/14 1249  GLUCAP 134*    Imaging No results found.   ASSESSMENT / PLAN:  PULMONARY OETT 8/19>>> Acute respiratory failure - r/t volume overload  P:   Intubation, vent support, 8cc/kg  Volume removal per  HD  PRN BD  F/u CXR  F/u ABG   CARDIOVASCULAR R fem cortis 8/19>>> Severe symptomatic bradycardia - presumed r/t hyperkalemia v primary cardiac event  Hx CAD  P:  Dr. Anselm Jungling at bedside - to cath lab for temp pacer  HD per renal - see below  Cycle enzymes  F/u EKG   RENAL ESRD  Hyperkalemia  Metabolic acidosis - ?etiology of multiple metabolic abnormalities if he was just dialized 8/17 on schedule  P:   Renal to see  Will need urgent HD  rx hyperkalemia - done  Stat ABG  HCO3  Will place HD cath post cath lab   GASTROINTESTINAL No active  issue  P:   PPI  NPO   HEMATOLOGIC Anemia - mild  P:  F/u cbc  SQ heparin   INFECTIOUS No known source infection  P:   Monitor fever, wbc curve off abx   ENDOCRINE Hyperglycemia - mild, no known hx DM P:   Monitor   NEUROLOGIC AMS - in setting symptomatic bradycardia  P:   RASS goal: 0 Intermittent sedation while on vent   TODAY'S SUMMARY:  Stat cath lab for temp pacer, then will need HD cath and urgent HD.  ? Underlying etiology, doubt this is all driven by K+ 6.6 in ESRD pt with apparently worsening despite hyperkalemia rx. Will f/u.    Dirk Dress, NP 01/07/2014  1:54 PM Pager: [phone] or [phone]  Reviewed above, and examined.  63 yo male with hx of ESRD presents to ER with dyspnea, diaphoresis, and bradycardia after having trouble with AV graft from dialysis.  He was found to have hyperkalemia and acidosis.  He had severe bradycardia resulting in cardiogenic shock with acute pulmonary edema.  He was seen by cardiology in ER with plan for temporary pacer insertion.  He was requiring dopamine infusion after getting atropine/epinephrine.  He also received HCO3, calcium, and insulin/D50.  Renal has been consulted.  He was intubated due to respiratory distress and acidosis.  Plan will be to place HD catheter once he returns from cardiac cath lab and then proceed with emergent dialysis.  CC time 50 minutes.  Coralyn Helling, MD [facility] Pulmonary/Critical Care 01/07/2014, 2:22 PM Pager:  [phone] After 3pm call: [phone]

## 2014-01-07 NOTE — ED Notes (Signed)
Pace rate turned to 60, mA remains at 18

## 2014-01-07 NOTE — Progress Notes (Signed)
eLink Physician-Brief Progress Note Patient Name: Andrew DiegoHerbert B [name] DOB: [birth year] MRN: [id number]   Date of Service  01/07/2014  HPI/Events of Note    eICU Interventions  Continuous sedation initiated     Intervention Category Major Interventions: Delirium, psychosis, severe agitation - evaluation and management  Ellenore Roscoe S. 01/07/2014, 7:43 PM

## 2014-01-07 NOTE — Code Documentation (Signed)
Preparing for intubation

## 2014-01-07 NOTE — ED Notes (Signed)
Pt was at dialysis, (mwf), sob, cath on left arm cloted and pt became diaphoretic, sob, hr in 30's with ems, AMS and pt felt bloated, nauseated on arrival, abc intact nad  Per ems pt had 15 sec seizure and then hr came back up.

## 2014-01-07 NOTE — Procedures (Signed)
Central Venous Catheter Insertion Procedure Note Andrew DiegoHerbert B [name] [id number] 19-Jul-1950  Procedure: Insertion of Central Venous Catheter Indications: ESRD needing hemodialysis access  Procedure Details Consent: Unable to obtain consent because of emergent medical necessity. Time Out: Verified patient identification, verified procedure, site/side was marked, verified correct patient position, special equipment/implants available, medications/allergies/relevent history reviewed, required imaging and test results available.  Performed  Maximum sterile technique was used including antiseptics, cap, gloves, gown, hand hygiene, mask and sheet. Skin prep: Chlorhexidine; local anesthetic administered A antimicrobial bonded/coated triple lumen catheter was placed in the right internal jugular vein using the Seldinger technique.  Evaluation Blood flow good Complications: No apparent complications Patient did tolerate procedure well. Chest X-ray ordered to verify placement.  CXR: pending.  Performed by Dirk DressKaty Whiteheart, NP with u/s guidance.  I was present for procedure.  Coralyn HellingVineet Pilot Prindle, MD [facility] Pulmonary/Critical Care 01/07/2014, 4:08 PM Pager:  [phone] After 3pm call: [phone]

## 2014-01-07 NOTE — Consult Note (Signed)
[facility] Renal Consultation Note    Indication for Consultation:  Management of ESRD/hemodialysis; anemia, hypertension/volume and secondary hyperparathyroidism  HPI: Andrew [name] is a 63 y.o. male.  Pt is a 63yo AAM with PMH sig for CAD and ESRD who presented [place] dialysis clinic in [place] but was found to have a clotted AVG.  He then complained of SSCP and SOB.  EMS was called and pt noted to be bradycardic and NSTEMI code was called.  [facility] under code STEMI, however he was noted to be in complete heart block and code STEMI was cancelled.  External pacing pads were placed and labs revealed hyperkalemia (K of 6.4).  We were contacted to help manage his dialysis needs, however the external pacers were not capturing and the patient required intubation and urgent transfer to cath lab for transvenous pacer placement.  He is now intubated and requiring 7310mcg/min of dopamine to maintain BP.  No medical history is currently available for review and no family is available for interview.  After calling different HD units we determined he goes to [facility] and were able to obtain some history.  The charge nurse noted that he developed hives last week and his aranesp and hectoral were stopped.  Otherwise doing ok.    Past Medical History  Diagnosis Date  . End stage renal disease   . Coronary artery disease   . Obesity, morbid   . GERD (gastroesophageal reflux disease)   . Cerebral palsy   . Sarcoidosis   . Hyperlipidemia   . Hypertension secondary to other renal disorders   . Secondary hyperparathyroidism (of renal origin)   . Anemia in chronic kidney disease(285.21)   . 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 No pertinent past surgical history. Family History:   History reviewed. No pertinent family history. Social History:  has no tobacco, alcohol, and drug history on file. Not on File Prior to Admission medications    Not on File   Current Facility-Administered Medications  Medication Dose Route Frequency Provider Last Rate Last Dose  . 0.9 %  sodium chloride infusion   Intravenous Continuous PRN Hilario Quarryanielle S Ray, MD 150 mL/hr at 01/07/14 1245 150 mL/hr at 01/07/14 1245  . 0.9 %  sodium chloride infusion   Intravenous Continuous Coralyn HellingVineet Sood, MD 20 mL/hr at 01/07/14 1400    . albuterol (PROVENTIL) (2.5 MG/3ML) 0.083% nebulizer solution 2.5 mg  2.5 mg Nebulization Q2H PRN Coralyn HellingVineet Sood, MD      . antiseptic oral rinse (CPC / CETYLPYRIDINIUM CHLORIDE 0.05%) solution 7 mL  7 mL Mouth Rinse QID Coralyn HellingVineet Sood, MD      . atropine injection   Intravenous PRN Hilario Quarryanielle S Ray, MD   1 mg at 01/07/14 1239  . chlorhexidine (PERIDEX) 0.12 % solution 15 mL  15 mL Mouth Rinse BID Coralyn HellingVineet Sood, MD      . dextrose 50 % solution   Intravenous PRN Hilario Quarryanielle S Ray, MD   50 mL at 01/07/14 1301  . DOPamine (INTROPIN) 800 mg in dextrose 5 % 250 mL (3.2 mg/mL) infusion  2-20 mcg/kg/min Intravenous Once Rachelle HoraKeri Smith, MD      . DOPamine (INTROPIN) 800 mg in dextrose 5 % 250 mL (3.2 mg/mL) infusion   Intravenous Continuous PRN Hilario Quarryanielle S Ray, MD 10.8 mL/hr at 01/07/14 1318 5 mcg/kg/min at 01/07/14 1318  . EPINEPHrine (ADRENALIN) 0.1 MG/ML injection   Intravenous PRN Hilario Quarryanielle S Ray, MD  0.5 mg at 01/07/14 1352  . etomidate (AMIDATE) 2 MG/ML injection           . etomidate (AMIDATE) injection   Intravenous PRN Hilario Quarry, MD   15 mg at 01/07/14 1339  . fentaNYL (SUBLIMAZE) 0.05 MG/ML injection           . fentaNYL (SUBLIMAZE) injection 25-100 mcg  25-100 mcg Intravenous Q2H PRN Coralyn Helling, MD      . fentaNYL (SUBLIMAZE) injection   Intravenous PRN Pamella Pert, MD   100 mcg at 01/07/14 1345  . heparin injection 5,000 Units  5,000 Units Subcutaneous 3 times per day Coralyn Helling, MD      . lidocaine (cardiac) 100 mg/66ml (XYLOCAINE) 20 MG/ML injection 2%           . midazolam (VERSED) 2 MG/2ML injection           . midazolam (VERSED) 5  MG/5ML injection   Intravenous PRN Pamella Pert, MD   4 mg at 01/07/14 1445  . midazolam (VERSED) injection 1-2 mg  1-2 mg Intravenous Q1H PRN Coralyn Helling, MD   2 mg at 01/07/14 1544  . norepinephrine (LEVOPHED) 4 mg in dextrose 5 % 250 mL infusion  2-50 mcg/min Intravenous Once Rachelle Hora, MD      . pantoprazole (PROTONIX) injection 40 mg  40 mg Intravenous Q24H Coralyn Helling, MD      . rocuronium (ZEMURON) 50 MG/5ML injection           . rocuronium (ZEMURON) injection   Intravenous PRN Hilario Quarry, MD   100 mg at 01/07/14 1339  . succinylcholine (ANECTINE) 20 MG/ML injection            Labs: Basic Metabolic Panel:  Recent Labs Lab 01/07/14 1241 01/07/14 1243  NA 134* 138  K 6.6* 6.8*  CL 105 95*  CO2  --  18*  GLUCOSE 169* 167*  BUN 123* 106*  CREATININE 16.60* 15.14*  CALCIUM  --  9.8   Liver Function Tests:  Recent Labs Lab 01/07/14 1243  AST 16  ALT 12  ALKPHOS 226*  BILITOT 0.4  PROT 8.1  ALBUMIN 3.9   No results found for this basename: LIPASE, AMYLASE,  in the last 168 hours No results found for this basename: AMMONIA,  in the last 168 hours CBC:  Recent Labs Lab 01/07/14 1241 01/07/14 1243  WBC  --  9.5  NEUTROABS  --  5.2  HGB 10.5* 9.5*  HCT 31.0* 29.1*  MCV  --  93.0  PLT  --  242   Cardiac Enzymes: No results found for this basename: CKTOTAL, CKMB, CKMBINDEX, TROPONINI,  in the last 168 hours CBG:  Recent Labs Lab 01/07/14 1249  GLUCAP 134*   Iron Studies: No results found for this basename: IRON, TIBC, TRANSFERRIN, FERRITIN,  in the last 72 hours Studies/Results: Dg Chest Portable 1 View  01/07/2014   CLINICAL DATA:  Chest pain  EXAM: PORTABLE CHEST - 1 VIEW  COMPARISON:  PA and lateral chest x-ray of July 31, 2007  FINDINGS: The lungs are adequately inflated. The interstitial markings are increased bilaterally. There is small amount of fluid in the minor fissure on the right. The cardiopericardial silhouette is enlarged. The  pulmonary vascularity is engorged. The mediastinum is top normal in width. External pacemaker pads are present.  IMPRESSION: The findings are consistent with congestive heart failure and pulmonary interstitial edema. There is no focal pneumonia.   Electronically  Signed   By: David  [place]   On: 01/07/2014 12:57    ROS: Review of systems not obtained due to patient factors. Physical Exam: Filed Vitals:   01/07/14 1317 01/07/14 1318 01/07/14 1405 01/07/14 1500  BP: 90/68     Pulse: 65  66   Temp:      TempSrc:      Resp: 34  16   Weight:  115.001 kg (253 lb 8.5 oz)  117.8 kg (259 lb 11.2 oz)  SpO2: 84%  100%       Weight change:  No intake or output data in the 24 hours ending 01/07/14 1548 BP 90/68  Pulse 66  Temp(Src) 97.5 F (36.4 C) (Oral)  Resp 16  Wt 117.8 kg (259 lb 11.2 oz)  SpO2 100% General appearance: morbidly obese and intubated with occassional myoclonic jerk Head: Normocephalic, without obvious abnormality, atraumatic Neck: no adenopathy, no carotid bruit, supple, symmetrical, trachea midline, thyroid not enlarged, symmetric, no tenderness/mass/nodules and large neck Resp: clear to auscultation bilaterally Cardio: regular rate and rhythm and no rub GI: obese, Normoactive bowel sounds, NT Extremities: extremities normal, atraumatic, no cyanosis or edema and LUE AVG without thrill or bruit Dialysis Access:LUE AVG  Dialysis Orders: Center: [facility]  on MWF . EDW 116kg HD Bath 2K/2Ca/1Mg   Time 4hrs Heparin 7,000 units bolus and 2,000 units mid tx. Access LUE AVG BFR 450 DFR 800      Assessment/Plan: 1.  complete heart block- s/p urgent transvenous pacer placement (appreciate Dr. Verl Dicker care).  This is out of proportion to a K of 6.4 in a dialysis patient and an other etiology will need to be investigated.  Will need to obtain outpt medlist and records. 2. VDRF- per PCCM s/p intubation 3. SIRS- unclear etiology, pan culture, cont with pressors, and  abx per PCCM 4.  ESRD -  Will start CVVHD due to hypotension/pressor-requirement 5.  Hypertension/volume  - as above on pressors 6.  Anemia  - hold aranesp due to possible allergy 7.  Metabolic bone disease -  Hold hectoral for now 8.  Nutrition - per PCCM  Irena Cords, MD [facility]  01/07/2014, 3:48 PM

## 2014-01-08 ENCOUNTER — Inpatient Hospital Stay (HOSPITAL_COMMUNITY): Payer: Medicare Other

## 2014-01-08 ENCOUNTER — Ambulatory Visit: Payer: Self-pay | Admitting: Vascular Surgery

## 2014-01-08 LAB — RENAL FUNCTION PANEL
ANION GAP: 19 — AB (ref 5–15)
ANION GAP: 14 (ref 5–15)
Albumin: 3.6 g/dL (ref 3.5–5.2)
Albumin: 3.5 g/dL (ref 3.5–5.2)
BUN: 75 mg/dL — ABNORMAL HIGH (ref 6–23)
BUN: 48 mg/dL — AB (ref 6–23)
CHLORIDE: 96 meq/L (ref 96–112)
CO2: 21 mEq/L (ref 19–32)
CO2: 23 mEq/L (ref 19–32)
Calcium: 9.9 mg/dL (ref 8.4–10.5)
Calcium: 9.3 mg/dL (ref 8.4–10.5)
Chloride: 99 mEq/L (ref 96–112)
Creatinine, Ser: 10.24 mg/dL — ABNORMAL HIGH (ref 0.50–1.35)
Creatinine, Ser: 7.08 mg/dL — ABNORMAL HIGH (ref 0.50–1.35)
GFR calc Af Amer: 9 mL/min — ABNORMAL LOW (ref >90)
GFR calc non Af Amer: 5 mL/min — ABNORMAL LOW (ref >90)
GFR calc non Af Amer: 7 mL/min — ABNORMAL LOW (ref >90)
GFR, EST AFRICAN AMERICAN: 5 mL/min — AB (ref >90)
GLUCOSE: 132 mg/dL — AB (ref 70–99)
GLUCOSE: 125 mg/dL — AB (ref 70–99)
PHOSPHORUS: 3.9 mg/dL (ref 2.3–4.6)
PHOSPHORUS: 3.5 mg/dL (ref 2.3–4.6)
POTASSIUM: 5.5 meq/L — AB (ref 3.7–5.3)
POTASSIUM: 6.3 meq/L — AB (ref 3.7–5.3)
SODIUM: 136 meq/L — AB (ref 137–147)
Sodium: 136 mEq/L — ABNORMAL LOW (ref 137–147)

## 2014-01-08 LAB — CBC
HCT: 28.5 % — ABNORMAL LOW (ref 39.0–52.0)
Hemoglobin: 9.7 g/dL — ABNORMAL LOW (ref 13.0–17.0)
MCH: 31.1 pg (ref 26.0–34.0)
MCHC: 34 g/dL (ref 30.0–36.0)
MCV: 91.3 fL (ref 78.0–100.0)
Platelets: 175 10*3/uL (ref 150–400)
RBC: 3.12 MIL/uL — ABNORMAL LOW (ref 4.22–5.81)
RDW: 13.8 % (ref 11.5–15.5)
WBC: 5.8 10*3/uL (ref 4.0–10.5)

## 2014-01-08 LAB — POCT ACTIVATED CLOTTING TIME
ACTIVATED CLOTTING TIME: 123 s
ACTIVATED CLOTTING TIME: 129 s
ACTIVATED CLOTTING TIME: 140 s
ACTIVATED CLOTTING TIME: 163 s
ACTIVATED CLOTTING TIME: 157 s
ACTIVATED CLOTTING TIME: 157 s
ACTIVATED CLOTTING TIME: 163 s
ACTIVATED CLOTTING TIME: 168 s
ACTIVATED CLOTTING TIME: 174 s
ACTIVATED CLOTTING TIME: 163 s
Activated Clotting Time: 129 seconds
Activated Clotting Time: 129 seconds
Activated Clotting Time: 140 seconds
Activated Clotting Time: 146 seconds
Activated Clotting Time: 152 seconds
Activated Clotting Time: 152 seconds
Activated Clotting Time: 152 seconds
Activated Clotting Time: 163 seconds
Activated Clotting Time: 163 seconds
Activated Clotting Time: 169 seconds
Activated Clotting Time: 169 seconds
Activated Clotting Time: 174 seconds

## 2014-01-08 LAB — MAGNESIUM: Magnesium: 2 mg/dL (ref 1.5–2.5)

## 2014-01-08 LAB — APTT: APTT: 55 s — AB (ref 24–37)

## 2014-01-08 LAB — GLUCOSE, CAPILLARY
GLUCOSE-CAPILLARY: 120 mg/dL — AB (ref 70–99)
Glucose-Capillary: 115 mg/dL — ABNORMAL HIGH (ref 70–99)
Glucose-Capillary: 107 mg/dL — ABNORMAL HIGH (ref 70–99)
Glucose-Capillary: 115 mg/dL — ABNORMAL HIGH (ref 70–99)
Glucose-Capillary: 125 mg/dL — ABNORMAL HIGH (ref 70–99)

## 2014-01-08 LAB — TROPONIN I: Troponin I: 0.46 ng/mL (ref <0.30)

## 2014-01-08 MED ORDER — PERFLUTREN LIPID MICROSPHERE
1.0000 mL | INTRAVENOUS | Status: AC | PRN
  Filled 2014-01-08: qty 10

## 2014-01-08 MED ORDER — PERFLUTREN LIPID MICROSPHERE
1.5000 mL | INTRAVENOUS | Status: DC | PRN
  Filled 2014-01-08: qty 10

## 2014-01-08 MED ORDER — PERFLUTREN LIPID MICROSPHERE
INTRAVENOUS | Status: AC
  Administered 2014-01-08: 0.2 mL
  Filled 2014-01-08: qty 10

## 2014-01-08 MED ORDER — PRISMASOL BGK 0/2.5 32-2.5 MEQ/L IV SOLN
INTRAVENOUS | Status: DC
  Administered 2014-01-08 – 2014-01-10 (×12): via INTRAVENOUS_CENTRAL
  Filled 2014-01-08 (×23): qty 5000

## 2014-01-08 MED ORDER — PRO-STAT SUGAR FREE PO LIQD
60.0000 mL | Freq: Three times a day (TID) | ORAL | Status: DC
  Administered 2014-01-08 – 2014-01-10 (×6): 60 mL
  Filled 2014-01-08 (×8): qty 60

## 2014-01-08 MED ORDER — VITAL HIGH PROTEIN PO LIQD
1000.0000 mL | ORAL | Status: DC
  Administered 2014-01-08 – 2014-01-09 (×2): 1000 mL
  Filled 2014-01-08 (×7): qty 1000

## 2014-01-08 MED ORDER — PRISMASOL BGK 0/2.5 32-2.5 MEQ/L IV SOLN: INTRAVENOUS | Status: DC

## 2014-01-08 MED ORDER — DOPAMINE-DEXTROSE 3.2-5 MG/ML-% IV SOLN
2.0000 ug/kg/min | Freq: Once | INTRAVENOUS | Status: AC
  Administered 2014-01-09: 4 ug/kg/min via INTRAVENOUS
  Filled 2014-01-08: qty 250

## 2014-01-08 NOTE — Progress Notes (Addendum)
INITIAL NUTRITION ASSESSMENT  DOCUMENTATION CODES Per approved criteria  -Morbid Obesity   INTERVENTION:  Initiate Vital HP at 20 ml/hr with Prostat liquid protein 60 ml TID to provide 1060 kcals, 132 gm protein (88% of estimated protein needs), 401 ml of free water.  TF regimen plus current Propofol infusion will meet 73% of estimated kcal needs.  Unable to meet 100% of estimated protein needs with Propofol use. RD to follow for nutrition care plan  NUTRITION DIAGNOSIS: Inadequate oral intake related to inability to eat as evidenced by NPO status  Goal: Enteral nutrition to provide 60-70% of estimated calorie needs (22-25 kcals/kg ideal body weight) and 100% of estimated protein needs, based on ASPEN guidelines for permissive underfeeding in critically ill obese individuals  Monitor:  TF regimen & tolerance, respiratory status, weight, labs, I/O's  Reason for Assessment: Consult  63 y.o. male  Admitting Dx: bradycardia, SOB  ASSESSMENT: 63yo male with hx ESRD, CAD presented with chest pain. Presented for his usual HD but his graft was clotted. He began c/o chest pain and SOB and was found to be significantly bradycardic, hyperkalemic and volume overloaded. Was sent to ER as STEMI initially which was cancelled. He had symptomatic bradycardia with syncope x2 and required external pacing. Became increasingly dyspneic and lost capture from external pacer.  Patient s/p procedure 8/19: ULTRASOUND GUIDED PLACEMENT OF TEMPORARY TRANSVENOUS PACEMAKER   Patient is currently intubated on ventilator support -- OGT in place MV: 11.2 L/min Temp (24hrs), Avg:97.5 F (36.4 C), Min:96.3 F (35.7 C), Max:98.3 F (36.8 C)   Propofol: 17.7 ml/hr -----> 467 fat kcals   Patient currently on CVVHD due to hypotension/pressor-requirement.  No nutrition problems identified PTA.  No muscle or subcutaneous fat depletion noticed.  RD consulted for TF initiation & management.  Height: Ht Readings  from Last 1 Encounters:  01/07/14 5\' 4"  (1.626 m)    Weight: Wt Readings from Last 1 Encounters:  01/08/14 255 lb 8.2 oz (115.9 kg)    Ideal Body Weight: 120 lb  % Ideal Body Weight: 212%  Wt Readings from Last 10 Encounters:  01/08/14 255 lb 8.2 oz (115.9 kg)  01/08/14 255 lb 8.2 oz (115.9 kg)  01/08/14 255 lb 8.2 oz (115.9 kg)    Usual Body Weight: unable to obtain  % Usual Body Weight: ---  BMI:  Body mass index is 43.84 kg/(m^2).  Estimated Nutritional Needs: Kcal: 2088 Protein: 150-160 gm Fluid: per MD  Skin: Intact  Diet Order: NPO  EDUCATION NEEDS: -No education needs identified at this time   Intake/Output Summary (Last 24 hours) at 01/08/14 1009 Last data filed at 01/08/14 1000  Gross per 24 hour  Intake 1020.7 ml  Output   2055 ml  Net -1034.3 ml    Labs:   Recent Labs Lab 01/07/14 1241 01/07/14 1243 01/08/14 0415  NA 134* 138 136*  K 6.6* 6.8* 5.5*  CL 105 95* 96  CO2  --  18* 21  BUN 123* 106* 75*  CREATININE 16.60* 15.14* 10.24*  CALCIUM  --  9.8 9.9  MG  --   --  2.0  PHOS  --   --  3.9  GLUCOSE 169* 167* 132*    CBG (last 3)   Recent Labs  01/07/14 1958 01/07/14 2345 01/08/14 0738  GLUCAP 118* 110* 115*    Scheduled Meds: . antiseptic oral rinse  7 mL Mouth Rinse QID  . chlorhexidine  15 mL Mouth Rinse BID  . DOPamine  2-20 mcg/kg/min Intravenous Once  . heparin subcutaneous  5,000 Units Subcutaneous 3 times per day  . insulin aspart  2-6 Units Subcutaneous 6 times per day  . norepinephrine (LEVOPHED) Adult infusion  2-50 mcg/min Intravenous Once  . pantoprazole (PROTONIX) IV  40 mg Intravenous Q24H    Continuous Infusions: . sodium chloride 10 mL/hr (01/08/14 0800)  . sodium chloride 10 mL/hr at 01/08/14 0800  . heparin 10,000 units/ 20 mL infusion syringe 2,800 Units/hr (01/08/14 1008)  . dialysis replacement fluid (prismasate) 300 mL/hr at 01/07/14 1837  . dialysis replacement fluid (prismasate) 300 mL/hr at  01/07/14 1630  . dialysate (PRISMASATE) 1,500 mL/hr at 01/08/14 0840  . propofol 25 mcg/kg/min (01/08/14 0931)    Past Medical History  Diagnosis Date  . End stage renal disease   . Coronary artery disease   . Obesity, morbid   . GERD (gastroesophageal reflux disease)   . Cerebral palsy   . Sarcoidosis   . Hyperlipidemia   . Hypertension secondary to other renal disorders   . Secondary hyperparathyroidism (of renal origin)   . Anemia in chronic kidney disease(285.21)   . 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 No pertinent past surgical history.  Maureen ChattersKatie Donivin Wirt, RD, LDN Pager #: [phone] After-Hours Pager #: [phone]

## 2014-01-08 NOTE — Progress Notes (Signed)
PULMONARY / CRITICAL CARE MEDICINE   Name: Andrew [name] MRN: [id number] DOB: [birth year]    ADMISSION DATE:  01/07/2014  REFERRING MD :  EDP  CHIEF COMPLAINT:  Bradycardia, SOB  INITIAL PRESENTATION: 63 yo male with hx ESRD, CAD admitted 8/19 with severe symptomatic bradycardia and dyspnea.   STUDIES:  2D echo 8/19>>>  SIGNIFICANT EVENTS: 8/19 hyperkalemic cardiac arrest s/p pacer placement  SUBJECTIVE: No events since CRRT was started and pacer was placed.  Remains on 4 mcg of dopamine drip.  VITAL SIGNS: Temp:  [96.3 F (35.7 C)-98.3 F (36.8 C)] 98.3 F (36.8 C) (08/20 0700) Pulse Rate:  [16-107] 80 (08/20 0900) Resp:  [0-38] 15 (08/20 0900) BP: (71-168)/(34-93) 125/71 mmHg (08/20 0823) SpO2:  [84 %-100 %] 95 % (08/20 0900) Arterial Line BP: (107-139)/(60-85) 127/70 mmHg (08/20 0900) FiO2 (%):  [50 %-100 %] 50 % (08/20 0823) Weight:  [253 lb 8.5 oz (115.001 kg)-259 lb 11.2 oz (117.8 kg)] 255 lb 8.2 oz (115.9 kg) (08/20 0412) HEMODYNAMICS:   VENTILATOR SETTINGS:  Vent Mode:  [-] PRVC FiO2 (%):  [50 %-100 %] 50 % Set Rate:  [16 bmp] 16 bmp Vt Set:  [620 mL-650 mL] 650 mL PEEP:  [5 cmH20] 5 cmH20 Plateau Pressure:  [22 cmH20-31 cmH20] 24 cmH20 INTAKE / OUTPUT:  Intake/Output Summary (Last 24 hours) at 01/08/14 0957 Last data filed at 01/08/14 0900  Gross per 24 hour  Intake  979.4 ml  Output   1970 ml  Net -990.6 ml   PHYSICAL EXAMINATION: General:  Obese, chronically ill appearing male, acutely ill  Neuro:  Awake, intermittent syncope/near syncope, MAE, follows commands  HEENT:  Mm dry, no JVD Cardiovascular:  S1s2, severe bradycardia (20's), distant  Lungs:  resps even, mildly labored, diminished, few scattered rales  Abdomen:  Obese, soft, +bs Musculoskeletal:  Cool, clammy, scant BLE edema   LABS:  CBC  Recent Labs Lab 01/07/14 1241 01/07/14 1243 01/08/14 0415  WBC  --  9.5 5.8  HGB 10.5* 9.5* 9.7*  HCT 31.0* 29.1* 28.5*  PLT  --  242  175   Coag's  Recent Labs Lab 01/07/14 1243 01/08/14 0415  APTT  --  55*  INR 1.17  --    BMET  Recent Labs Lab 01/07/14 1241 01/07/14 1243 01/08/14 0415  NA 134* 138 136*  K 6.6* 6.8* 5.5*  CL 105 95* 96  CO2  --  18* 21  BUN 123* 106* 75*  CREATININE 16.60* 15.14* 10.24*  GLUCOSE 169* 167* 132*   Electrolytes  Recent Labs Lab 01/07/14 1243 01/08/14 0415  CALCIUM 9.8 9.9  MG  --  2.0  PHOS  --  3.9   Sepsis Markers No results found for this basename: LATICACIDVEN, PROCALCITON, O2SATVEN,  in the last 168 hours ABG  Recent Labs Lab 01/07/14 1333 01/07/14 1721  PHART 7.265* 7.267*  PCO2ART 32.2* 39.0  PO2ART 58.0* 93.0   Liver Enzymes  Recent Labs Lab 01/07/14 1243 01/08/14 0415  AST 16  --   ALT 12  --   ALKPHOS 226*  --   BILITOT 0.4  --   ALBUMIN 3.9 3.6   Cardiac Enzymes  Recent Labs Lab 01/07/14 1445 01/07/14 2045 01/08/14 0245  TROPONINI <0.30 0.33* 0.46*   Glucose  Recent Labs Lab 01/07/14 1249 01/07/14 1547 01/07/14 1958 01/07/14 2345 01/08/14 0738  GLUCAP 134* 210* 118* 110* 115*    Imaging Dg Chest Port 1 View  01/07/2014   CLINICAL DATA:  HD catheter and nasogastric tube placement  EXAM: PORTABLE CHEST - 1 VIEW  COMPARISON:  Prior chest x-ray earlier today at 12:47 p.m.  FINDINGS: Interval intubation. The tip of the ET tube is 1.6 cm above the carina. A right IJ approach central venous catheter has been placed. The tip overlies the upper SVC. A nasogastric tube is present. The proximal side hole can be identified overlying the gastric fundus. No evidence of pneumothorax or large hemothorax. Stable cardiomegaly. Improving pulmonary edema. Persistent bibasilar and right mid lung atelectasis. External defibrillator pads again per just over the left chest. Femoral approach cardiac rhythm maintenance device may represent a transvenous pacer.  IMPRESSION: 1. Interval intubation. The tip of the ET tube is 1.6 cm above the carina. 2.  New right IJ temporary hemodialysis catheter with the tip in the upper SVC. No evidence of complicating pneumothorax. 3. The tip of the nasogastric tube is within the stomach. 4. Probable femoral approach transvenous pacing lead. The lead projects over the right ventricle. 5. Slightly improved pulmonary edema.   Electronically Signed   By: Malachy Moan M.D.   On: 01/07/2014 16:41   Dg Chest Portable 1 View  01/07/2014   CLINICAL DATA:  Chest pain  EXAM: PORTABLE CHEST - 1 VIEW  COMPARISON:  PA and lateral chest x-ray of July 31, 2007  FINDINGS: The lungs are adequately inflated. The interstitial markings are increased bilaterally. There is small amount of fluid in the minor fissure on the right. The cardiopericardial silhouette is enlarged. The pulmonary vascularity is engorged. The mediastinum is top normal in width. External pacemaker pads are present.  IMPRESSION: The findings are consistent with congestive heart failure and pulmonary interstitial edema. There is no focal pneumonia.   Electronically Signed   By: David  [place]   On: 01/07/2014 12:57     ASSESSMENT / PLAN:  PULMONARY OETT 8/19>>> Acute respiratory failure - r/t volume overload  P:   - Maintain on full vent support for now. - Volume removal per HD, see below. - PRN BD. - CXR and ABG in AM. - SBT in AM after volume removal.  CARDIOVASCULAR R fem cortis 8/19>>> Severe symptomatic bradycardia - presumed r/t hyperkalemia v primary cardiac event  Hx CAD  P:  - Back up rate on pacer down to 50 but patient's intrinsic rate is >50 at this point. - HD per renal - see below. - F/u echo done and pending.  RENAL ESRD  Hyperkalemia  Metabolic acidosis - ?etiology of multiple metabolic abnormalities if he was just dialized 8/17 on schedule  P:   - CVVH at -50 ml/hr. - BMET in AM. - Replace electrolytes as indicated.  GASTROINTESTINAL No active issue  P:   - PPI. - Consult nutrition for TF as per  nutrition.  HEMATOLOGIC Anemia - mild  P:  - F/u cbc. - SQ heparin.  INFECTIOUS No known source infection  P:   - Monitor fever, wbc curve off abx.  ENDOCRINE Hyperglycemia - mild, no known hx DM P:   - Monitor.  NEUROLOGIC AMS - in setting symptomatic bradycardia, now on propofol but purposeful and following commands. P:   - RASS goal: 0. - Low dose propofol, will continue that for now as anticipate with volume removal will be able to extubate in AM.  TODAY'S SUMMARY: Continue volume negative for now, anticipate will be able extubate in AM.  CC time 35 minutes.  Alyson Reedy, M.D. [facility] Pulmonary/Critical Care Medicine. Pager: [phone]. After  hours pager: [phone].

## 2014-01-08 NOTE — Progress Notes (Signed)
Subjective:  Intubated. Still on 4 mcg dopamine. On continuous dialysis  Objective:  Vital Signs in the last 24 hours: Temp:  [96.3 F (35.7 C)-98.3 F (36.8 C)] 98.3 F (36.8 C) (08/20 0700) Pulse Rate:  [16-107] 80 (08/20 0823) Resp:  [0-38] 18 (08/20 0823) BP: (71-168)/(34-93) 125/71 mmHg (08/20 0823) SpO2:  [84 %-100 %] 96 % (08/20 0823) Arterial Line BP: (107-139)/(60-85) 114/70 mmHg (08/20 0700) FiO2 (%):  [50 %-100 %] 50 % (08/20 0823) Weight:  [115.001 kg (253 lb 8.5 oz)-117.8 kg (259 lb 11.2 oz)] 115.9 kg (255 lb 8.2 oz) (08/20 0412)  Intake/Output from previous day: 08/19 0701 - 08/20 0700 In: 748.3 [I.V.:748.3] Out: 1773 [Emesis/NG output:600]  Physical Exam:   General appearance: Sedated and intubated Lungs: clear to auscultation bilaterally  Heart: Distant heart sounds,  Abdomen: soft, non-tender; bowel sounds normal; no masses, no organomegaly and Large pannus present  Extremities: extremities normal, atraumatic, no cyanosis or edema and Left forearm arteriovenous fistula      Lab Results: BMP  Recent Labs  01/07/14 1241 01/07/14 1243 01/08/14 0415  NA 134* 138 136*  K 6.6* 6.8* 5.5*  CL 105 95* 96  CO2  --  18* 21  GLUCOSE 169* 167* 132*  BUN 123* 106* 75*  CREATININE 16.60* 15.14* 10.24*  CALCIUM  --  9.8 9.9  GFRNONAA  --  3* 5*  GFRAA  --  3* 5*    CBC  Recent Labs Lab 01/07/14 1243 01/08/14 0415  WBC 9.5 5.8  RBC 3.13* 3.12*  HGB 9.5* 9.7*  HCT 29.1* 28.5*  PLT 242 175  MCV 93.0 91.3  MCH 30.4 31.1  MCHC 32.6 34.0  RDW 14.2 13.8  LYMPHSABS 3.0  --   MONOABS 1.0  --   EOSABS 0.2  --   BASOSABS 0.0  --     HEMOGLOBIN A1C No results found for this basename: HGBA1C, MPG    Cardiac Panel (last 3 results)  Recent Labs  01/07/14 1445 01/07/14 2045 01/08/14 0245  TROPONINI <0.30 0.33* 0.46*    BNP (last 3 results) No results found for this basename: PROBNP,  in the last 8760 hours  TSH No results found for this  basename: TSH,  in the last 8760 hours  CHOLESTEROL No results found for this basename: CHOL,  in the last 8760 hours  Hepatic Function Panel  Recent Labs  01/07/14 1243 01/08/14 0415  PROT 8.1  --   ALBUMIN 3.9 3.6  AST 16  --   ALT 12  --   ALKPHOS 226*  --   BILITOT 0.4  --     Imaging: Imaging results have been reviewed  Cardiac Studies:  EKG: V paced rhythm @ 80/min.  Pacer threshold normal  With 100% capture at 0.8 mV. .   Assessment/Plan:  1. A. Fibrillation with controlled ventricular response. Patient was in A. Flutter yesterday (atypical). Will need anticoagulation long-term. Presently on IV heparin for dialysis. 2. Ventricular standstill and heart block. Etiology probably metabolic. VVI pacer rate turned down to 50/min on demand .   Pamella PertGANJI,JAGADEESH R, M.D. 01/08/2014, 9:15 AM [facility] Pager: [phone] Office: [phone] If no answer: [phone]

## 2014-01-08 NOTE — Progress Notes (Signed)
Patient ID: Andrew [name], male   DOB: 1950/12/18, 63 y.o.   MRN: [id number]  [facility] Progress Note    Subjective:   Intubated and sedated.  Less pressor support.   Objective:   BP 125/71  Pulse 80  Temp(Src) 98.3 F (36.8 C) (Axillary)  Resp 18  Ht 5\' 4"  (1.626 m)  Wt 115.9 kg (255 lb 8.2 oz)  BMI 43.84 kg/m2  SpO2 96%  Intake/Output: I/O last 3 completed shifts: In: 748.3 [I.V.:748.3] Out: 1773 [Emesis/NG output:600; Other:1173]   Intake/Output this shift:  Total I/O In: 189.8 [I.V.:189.8] Out: 95 [Other:95] Weight change:   Physical Exam: Gen:WD obese AAM intubated and sedated CVS:RRR Resp:cta JXB:JYNWGNAbd:benign Ext:no edema, no T/B in LUE AVG  Labs: BMET  Recent Labs Lab 01/07/14 1241 01/07/14 1243 01/08/14 0415  NA 134* 138 136*  K 6.6* 6.8* 5.5*  CL 105 95* 96  CO2  --  18* 21  GLUCOSE 169* 167* 132*  BUN 123* 106* 75*  CREATININE 16.60* 15.14* 10.24*  ALBUMIN  --  3.9 3.6  CALCIUM  --  9.8 9.9  PHOS  --   --  3.9   CBC  Recent Labs Lab 01/07/14 1241 01/07/14 1243 01/08/14 0415  WBC  --  9.5 5.8  NEUTROABS  --  5.2  --   HGB 10.5* 9.5* 9.7*  HCT 31.0* 29.1* 28.5*  MCV  --  93.0 91.3  PLT  --  242 175    @IMGRELPRIORS @ Medications:    . antiseptic oral rinse  7 mL Mouth Rinse QID  . chlorhexidine  15 mL Mouth Rinse BID  . DOPamine  2-20 mcg/kg/min Intravenous Once  . heparin subcutaneous  5,000 Units Subcutaneous 3 times per day  . insulin aspart  2-6 Units Subcutaneous 6 times per day  . norepinephrine (LEVOPHED) Adult infusion  2-50 mcg/min Intravenous Once  . pantoprazole (PROTONIX) IV  40 mg Intravenous Q24H     Assessment/ Plan:   1. Complete heart block- s/p urgent transvenous pacer placement (appreciate Dr. Verl DickerGanji's care). This is out of proportion to a K of 6.4 in a dialysis patient and an other etiology will need to be investigated. Will need to obtain outpt medlist and records. 1. Cardiac workup  underway, ECHO in progress 2. VDRF- per PCCM s/p intubation 3. SIRS- unclear etiology, pan culture, cont with pressors and wean as tolerated, per PCCM 4. ESRD - Will continue CVVHD today due to hypotension/pressor-requirement (which has been less) 5. Hypertension/volume - as above on pressors 6. Anemia - hold aranesp due to possible allergy 7. Metabolic bone disease - Hold hectoral for now 8. Nutrition - per PCCM 9. Vascular access- will need declot of LUE AVG once stable  [name] 01/08/2014, 8:32 AM

## 2014-01-08 NOTE — Progress Notes (Signed)
This RN after multiple attempts was finally able to reach pt's sister [name](Shirley Watkins). Pt's sister Talbert ForestShirley will try to visit pt this evening otherwise will be in to see pt tomorrow. Pt's  Sister - Shirley's number updated in demographics.

## 2014-01-08 NOTE — Progress Notes (Signed)
  Echocardiogram 2D Echocardiogram with Definity has been performed.  Cathie BeamsGREGORY, Argenis Kumari 01/08/2014, 9:17 AM

## 2014-01-09 ENCOUNTER — Inpatient Hospital Stay (HOSPITAL_COMMUNITY): Payer: Medicare Other

## 2014-01-09 LAB — BLOOD GAS, ARTERIAL
ACID-BASE EXCESS: 2.3 mmol/L — AB (ref 0.0–2.0)
Bicarbonate: 26 mEq/L — ABNORMAL HIGH (ref 20.0–24.0)
Drawn by: 331761
FIO2: 0.5 %
MECHVT: 650 mL
O2 SAT: 94.5 %
PATIENT TEMPERATURE: 100.4
PEEP: 5 cmH2O
PO2 ART: 76.1 mmHg — AB (ref 80.0–100.0)
RATE: 16 resp/min
TCO2: 27.2 mmol/L (ref 0–100)
pCO2 arterial: 40.4 mmHg (ref 35.0–45.0)
pH, Arterial: 7.43 (ref 7.350–7.450)

## 2014-01-09 LAB — RENAL FUNCTION PANEL
ANION GAP: 15 (ref 5–15)
ANION GAP: 16 — AB (ref 5–15)
Albumin: 3.2 g/dL — ABNORMAL LOW (ref 3.5–5.2)
Albumin: 3.2 g/dL — ABNORMAL LOW (ref 3.5–5.2)
BUN: 42 mg/dL — ABNORMAL HIGH (ref 6–23)
BUN: 35 mg/dL — AB (ref 6–23)
CALCIUM: 9.4 mg/dL (ref 8.4–10.5)
CHLORIDE: 95 meq/L — AB (ref 96–112)
CHLORIDE: 97 meq/L (ref 96–112)
CO2: 24 meq/L (ref 19–32)
CO2: 24 mEq/L (ref 19–32)
CREATININE: 4.02 mg/dL — AB (ref 0.50–1.35)
Calcium: 9.3 mg/dL (ref 8.4–10.5)
Creatinine, Ser: 5.13 mg/dL — ABNORMAL HIGH (ref 0.50–1.35)
GFR calc Af Amer: 13 mL/min — ABNORMAL LOW (ref >90)
GFR calc Af Amer: 17 mL/min — ABNORMAL LOW (ref >90)
GFR calc non Af Amer: 15 mL/min — ABNORMAL LOW (ref >90)
GFR, EST NON AFRICAN AMERICAN: 11 mL/min — AB (ref >90)
GLUCOSE: 118 mg/dL — AB (ref 70–99)
Glucose, Bld: 109 mg/dL — ABNORMAL HIGH (ref 70–99)
POTASSIUM: 4.2 meq/L (ref 3.7–5.3)
POTASSIUM: 3.9 meq/L (ref 3.7–5.3)
Phosphorus: 3.8 mg/dL (ref 2.3–4.6)
Phosphorus: 3.3 mg/dL (ref 2.3–4.6)
SODIUM: 134 meq/L — AB (ref 137–147)
Sodium: 137 mEq/L (ref 137–147)

## 2014-01-09 LAB — GLUCOSE, CAPILLARY
GLUCOSE-CAPILLARY: 104 mg/dL — AB (ref 70–99)
Glucose-Capillary: 109 mg/dL — ABNORMAL HIGH (ref 70–99)
Glucose-Capillary: 94 mg/dL (ref 70–99)
Glucose-Capillary: 103 mg/dL — ABNORMAL HIGH (ref 70–99)
Glucose-Capillary: 96 mg/dL (ref 70–99)
Glucose-Capillary: 112 mg/dL — ABNORMAL HIGH (ref 70–99)

## 2014-01-09 LAB — POCT ACTIVATED CLOTTING TIME
ACTIVATED CLOTTING TIME: 168 s
ACTIVATED CLOTTING TIME: 168 s
ACTIVATED CLOTTING TIME: 168 s
ACTIVATED CLOTTING TIME: 140 s
ACTIVATED CLOTTING TIME: 157 s
ACTIVATED CLOTTING TIME: 163 s
Activated Clotting Time: 163 seconds
Activated Clotting Time: 163 seconds
Activated Clotting Time: 163 seconds
Activated Clotting Time: 163 seconds
Activated Clotting Time: 168 seconds

## 2014-01-09 LAB — MAGNESIUM: Magnesium: 2.2 mg/dL (ref 1.5–2.5)

## 2014-01-09 LAB — CBC
HCT: 24.7 % — ABNORMAL LOW (ref 39.0–52.0)
Hemoglobin: 8.5 g/dL — ABNORMAL LOW (ref 13.0–17.0)
MCH: 31 pg (ref 26.0–34.0)
MCHC: 34.4 g/dL (ref 30.0–36.0)
MCV: 90.1 fL (ref 78.0–100.0)
Platelets: 158 10*3/uL (ref 150–400)
RBC: 2.74 MIL/uL — AB (ref 4.22–5.81)
RDW: 14 % (ref 11.5–15.5)
WBC: 5 10*3/uL (ref 4.0–10.5)

## 2014-01-09 LAB — APTT: aPTT: 84 seconds — ABNORMAL HIGH (ref 24–37)

## 2014-01-09 MED ORDER — DIPHENHYDRAMINE HCL 50 MG/ML IJ SOLN
25.0000 mg | Freq: Once | INTRAMUSCULAR | Status: AC
  Administered 2014-01-09: 25 mg via INTRAVENOUS

## 2014-01-09 MED ORDER — DIPHENHYDRAMINE HCL 50 MG/ML IJ SOLN
INTRAMUSCULAR | Status: AC
  Filled 2014-01-09: qty 1

## 2014-01-09 NOTE — Progress Notes (Signed)
PULMONARY / CRITICAL CARE MEDICINE   Name: Andrew [name] MRN: [id number] DOB: 20-Oct-1950    ADMISSION DATE:  01/07/2014  REFERRING MD :  EDP  CHIEF COMPLAINT:  Bradycardia, SOB  INITIAL PRESENTATION: 63 yo male with hx ESRD, CAD admitted 8/19 with severe symptomatic bradycardia and dyspnea.   STUDIES:  2D echo 8/19>>>  SIGNIFICANT EVENTS: 8/19 hyperkalemic cardiac arrest s/p pacer placement  SUBJECTIVE:  Agitated but off pressors, now mildly hypertensive when agitated.  VITAL SIGNS: Temp:  [97.5 F (36.4 C)-98.7 F (37.1 C)] 97.5 F (36.4 C) (08/21 0700) Pulse Rate:  [50-124] 124 (08/21 1000) Resp:  [0-26] 24 (08/21 1000) BP: (111-144)/(47-73) 143/70 mmHg (08/21 1108) SpO2:  [93 %-100 %] 93 % (08/21 1000) Arterial Line BP: (94-170)/(46-80) 170/80 mmHg (08/21 1000) FiO2 (%):  [40 %-50 %] 50 % (08/21 1108) Weight:  [256 lb 6.3 oz (116.3 kg)] 256 lb 6.3 oz (116.3 kg) (08/21 0500) HEMODYNAMICS:   VENTILATOR SETTINGS:  Vent Mode:  [-] PSV FiO2 (%):  [40 %-50 %] 50 % Set Rate:  [16 bmp] 16 bmp Vt Set:  [650 mL] 650 mL PEEP:  [5 cmH20] 5 cmH20 Pressure Support:  [15 cmH20] 15 cmH20 Plateau Pressure:  [18 cmH20-19 cmH20] 19 cmH20 INTAKE / OUTPUT:  Intake/Output Summary (Last 24 hours) at 01/09/14 1109 Last data filed at 01/09/14 1100  Gross per 24 hour  Intake 1632.99 ml  Output   1951 ml  Net -318.01 ml   PHYSICAL EXAMINATION: General:  Obese, chronically ill appearing male, acutely ill  Neuro:  Awake, MAE, follows commands, following commands HEENT:  Mm dry, no JVD Cardiovascular:  S1s2, severe bradycardia (20's), distant  Lungs:  resps even, mildly labored, diminished, few scattered rales  Abdomen:  Obese, soft, +bs Musculoskeletal:  Cool, clammy, scant BLE edema   LABS:  CBC  Recent Labs Lab 01/07/14 1243 01/08/14 0415 01/09/14 0400  WBC 9.5 5.8 5.0  HGB 9.5* 9.7* 8.5*  HCT 29.1* 28.5* 24.7*  PLT 242 175 158   Coag's  Recent Labs Lab  01/07/14 1243 01/08/14 0415 01/09/14 0400  APTT  --  55* 84*  INR 1.17  --   --    BMET  Recent Labs Lab 01/08/14 0415 01/08/14 1500 01/09/14 0400  NA 136* 136* 134*  K 5.5* 6.3* 4.2  CL 96 99 95*  CO2 21 23 24   BUN 75* 48* 42*  CREATININE 10.24* 7.08* 5.13*  GLUCOSE 132* 125* 118*   Electrolytes  Recent Labs Lab 01/08/14 0415 01/08/14 1500 01/09/14 0400  CALCIUM 9.9 9.3 9.4  MG 2.0  --  2.2  PHOS 3.9 3.5 3.8   Sepsis Markers No results found for this basename: LATICACIDVEN, PROCALCITON, O2SATVEN,  in the last 168 hours  ABG  Recent Labs Lab 01/07/14 1333 01/07/14 1721 01/09/14 0410  PHART 7.265* 7.267* 7.430  PCO2ART 32.2* 39.0 40.4  PO2ART 58.0* 93.0 76.1*   Liver Enzymes  Recent Labs Lab 01/07/14 1243 01/08/14 0415 01/08/14 1500 01/09/14 0400  AST 16  --   --   --   ALT 12  --   --   --   ALKPHOS 226*  --   --   --   BILITOT 0.4  --   --   --   ALBUMIN 3.9 3.6 3.5 3.2*   Cardiac Enzymes  Recent Labs Lab 01/07/14 1445 01/07/14 2045 01/08/14 0245  TROPONINI <0.30 0.33* 0.46*   Glucose  Recent Labs Lab 01/08/14 1117 01/08/14 1543  01/08/14 2023 01/08/14 2346 01/09/14 0357 01/09/14 0731  GLUCAP 107* 115* 125* 120* 109* 94   Imaging Dg Chest Port 1 View  01/08/2014   CLINICAL DATA:  Followup CHF  EXAM: PORTABLE CHEST - 1 VIEW  COMPARISON:  01/07/2014  FINDINGS: Endotracheal tube in good position. Right jugular catheter tip in the SVC. NG tube enters the stomach.  Negative for pneumothorax.  Diffuse bilateral airspace disease is unchanged. This may represent pneumonia or edema. Bibasilar atelectasis again noted. Small pleural effusions  IMPRESSION: Diffuse bilateral airspace disease unchanged. Support lines remain in good position.   Electronically Signed   By: Marlan Palau M.D.   On: 01/08/2014 07:39   ASSESSMENT / PLAN:  PULMONARY OETT 8/19>>> Acute respiratory failure - r/t volume overload  P:   - Begin PS trials, increased  secretions and agitation precludes extubation for today. - Volume even via CRRT. - PRN BD. - CXR and ABG in AM. - SBT in AM.  CARDIOVASCULAR R fem cortis 8/19>>> Severe symptomatic bradycardia - presumed r/t hyperkalemia v primary cardiac event  Hx CAD  P:  - Temp pacer to removed today. - HD per renal - see below. - F/u echo per cards. - Will need coumadin once off vent, maintain on heparin for a-fib for now.  RENAL ESRD  Hyperkalemia  Metabolic acidosis - ?etiology of multiple metabolic abnormalities if he was just dialized 8/17 on schedule  P:   - CVVH even now, ?transition to HD per renal. - BMET in AM. - Replace electrolytes as indicated.  GASTROINTESTINAL No active issue  P:   - PPI. - Continue TF until ready for extubation.  HEMATOLOGIC Anemia - mild  P:  - F/u cbc. - SQ heparin.  INFECTIOUS No known source infection  P:   - Monitor fever, wbc curve off abx.  ENDOCRINE Hyperglycemia - mild, no known hx DM P:   - Monitor.  NEUROLOGIC AMS - in setting symptomatic bradycardia, now on propofol but purposeful and following commands. P:   - RASS goal: 0. - Low dose propofol as I anticipate extubation in AM.  TODAY'S SUMMARY: Continue weaning, anticipate will extubate in AM once agitation are under better control and volume is more even.  CC time 35 minutes.  Alyson Reedy, M.D. [facility] Pulmonary/Critical Care Medicine. Pager: [phone]. After hours pager: [phone].

## 2014-01-09 NOTE — Progress Notes (Signed)
Subjective:  Intubated. Dopamine turned off.  Objective:  Vital Signs in the last 24 hours: Temp:  [97.5 F (36.4 C)-98.7 F (37.1 C)] 97.5 F (36.4 C) (08/21 0700) Pulse Rate:  [50-80] 80 (08/21 0900) Resp:  [0-26] 23 (08/21 0900) BP: (111-144)/(47-73) 142/73 mmHg (08/21 0758) SpO2:  [93 %-100 %] 100 % (08/21 0900) Arterial Line BP: (94-156)/(46-74) 137/65 mmHg (08/21 0900) FiO2 (%):  [40 %-50 %] 40 % (08/21 0800) Weight:  [116.3 kg (256 lb 6.3 oz)] 116.3 kg (256 lb 6.3 oz) (08/21 0500)  Intake/Output from previous day: 08/20 0701 - 08/21 0700 In: 1687.6 [I.V.:1275.3; NG/GT:412.3] Out: 2006   Physical Exam:   General appearance: Sedated and intubated Lungs: clear to auscultation bilaterally  Heart: Distant heart sounds,  Abdomen: soft, non-tender; bowel sounds normal; no masses, no organomegaly and Large pannus present  Extremities: extremities normal, atraumatic, no cyanosis or edema and Left forearm arteriovenous fistula      Lab Results: BMP  Recent Labs  01/08/14 0415 01/08/14 1500 01/09/14 0400  NA 136* 136* 134*  K 5.5* 6.3* 4.2  CL 96 99 95*  CO2 21 23 24   GLUCOSE 132* 125* 118*  BUN 75* 48* 42*  CREATININE 10.24* 7.08* 5.13*  CALCIUM 9.9 9.3 9.4  GFRNONAA 5* 7* 11*  GFRAA 5* 9* 13*    CBC  Recent Labs Lab 01/07/14 1243  01/09/14 0400  WBC 9.5  < > 5.0  RBC 3.13*  < > 2.74*  HGB 9.5*  < > 8.5*  HCT 29.1*  < > 24.7*  PLT 242  < > 158  MCV 93.0  < > 90.1  MCH 30.4  < > 31.0  MCHC 32.6  < > 34.4  RDW 14.2  < > 14.0  LYMPHSABS 3.0  --   --   MONOABS 1.0  --   --   EOSABS 0.2  --   --   BASOSABS 0.0  --   --   < > = values in this interval not displayed.  HEMOGLOBIN A1C No results found for this basename: HGBA1C,  MPG    Cardiac Panel (last 3 results)  Recent Labs  01/07/14 1445 01/07/14 2045 01/08/14 0245  TROPONINI <0.30 0.33* 0.46*    BNP (last 3 results) No results found for this basename: PROBNP,  in the last 8760  hours  TSH No results found for this basename: TSH,  in the last 8760 hours  CHOLESTEROL No results found for this basename: CHOL,  in the last 8760 hours  Hepatic Function Panel  Recent Labs  01/07/14 1243 01/08/14 0415 01/08/14 1500 01/09/14 0400  PROT 8.1  --   --   --   ALBUMIN 3.9 3.6 3.5 3.2*  AST 16  --   --   --   ALT 12  --   --   --   ALKPHOS 226*  --   --   --   BILITOT 0.4  --   --   --     Imaging: Imaging results have been reviewed  Cardiac Studies:  EKG: tele: A. Fib rate controlled  Pacer threshold normal  With 100% capture at 0.8 mV. . Scheduled Meds: . antiseptic oral rinse  7 mL Mouth Rinse QID  . chlorhexidine  15 mL Mouth Rinse BID  . DOPamine  2-20 mcg/kg/min Intravenous Once  . feeding supplement (PRO-STAT SUGAR FREE 64)  60 mL Per Tube TID  . feeding supplement (VITAL HIGH PROTEIN)  1,000  mL Per Tube Q24H  . heparin subcutaneous  5,000 Units Subcutaneous 3 times per day  . insulin aspart  2-6 Units Subcutaneous 6 times per day  . norepinephrine (LEVOPHED) Adult infusion  2-50 mcg/min Intravenous Once  . pantoprazole (PROTONIX) IV  40 mg Intravenous Q24H   Continuous Infusions: . sodium chloride 10 mL/hr at 01/09/14 0500  . sodium chloride 10 mL/hr at 01/08/14 0800  . heparin 10,000 units/ 20 mL infusion syringe 3,000 Units/hr (01/09/14 0843)  . dialysis replacement fluid (prismasate) 300 mL/hr at 01/09/14 0500  . dialysis replacement fluid (prismasate) 300 mL/hr at 01/09/14 0458  . dialysate (PRISMASATE) 1,500 mL/hr at 01/09/14 0808  . propofol Stopped (01/09/14 0900)   PRN Meds:.sodium chloride, albuterol, fentaNYL, heparin, heparin, heparin, midazolam   Assessment/Plan:  A. Fibrillation with controlled ventricular response.  Has not needed the pacemaker last 24 hours. Will wait for 3-4 hours off dopa and if no need for pacer, will discontinue the sheath and temporary wire.   Will need anticoagulation long-term. Presently on IV  heparin for dialysis. Start Coumadin when appropriate. [facility] [place] [facility] [facility] A. Fib.   Andrew [name],JAGADEESH R, M.D. 01/09/2014, 9:07 AM [facility] Pager: [phone] Office: [phone] If no answer: [phone]

## 2014-01-09 NOTE — Progress Notes (Signed)
IR PA aware of request for left upper extremity AVG declot. IR to evaluate patient on Monday 8/24 and when stable proceed with declot.  Pattricia BossKoreen Camara Renstrom PA-C Interventional Radiology  01/09/14  3:53 PM

## 2014-01-09 NOTE — Progress Notes (Signed)
Patient ID: Maudry DiegoHerbert B Carlisi, male   DOB: [birth year], 63 y.o.   MRN: [id number]  [facility] Progress Note    Subjective:   Intubated but awake/alert   Objective:   BP 142/73  Pulse 73  Temp(Src) 97.5 F (36.4 C) (Oral)  Resp 23  Ht 5\' 4"  (1.626 m)  Wt 116.3 kg (256 lb 6.3 oz)  BMI 43.99 kg/m2  SpO2 95%  Intake/Output: I/O last 3 completed shifts: In: 2227.9 [I.V.:1815.6; NG/GT:412.3] Out: 3779 [Emesis/NG output:600; Other:3179]   Intake/Output this shift:  Total I/O In: 40 [I.V.:20; NG/GT:20] Out: 81 [Other:81] Weight change: 1.299 kg (2 lb 13.8 oz)  Physical Exam: Gen:WD obese AAM intubated and responsive CVS:rrr Resp:cta UXL:KGMWNUAbd:benign Ext:no edema, LUE AVG no thrill/bruit  Labs: BMET  Recent Labs Lab 01/07/14 1241 01/07/14 1243 01/08/14 0415 01/08/14 1500 01/09/14 0400  NA 134* 138 136* 136* 134*  K 6.6* 6.8* 5.5* 6.3* 4.2  CL 105 95* 96 99 95*  CO2  --  18* 21 23 24   GLUCOSE 169* 167* 132* 125* 118*  BUN 123* 106* 75* 48* 42*  CREATININE 16.60* 15.14* 10.24* 7.08* 5.13*  ALBUMIN  --  3.9 3.6 3.5 3.2*  CALCIUM  --  9.8 9.9 9.3 9.4  PHOS  --   --  3.9 3.5 3.8   CBC  Recent Labs Lab 01/07/14 1241 01/07/14 1243 01/08/14 0415 01/09/14 0400  WBC  --  9.5 5.8 5.0  NEUTROABS  --  5.2  --   --   HGB 10.5* 9.5* 9.7* 8.5*  HCT 31.0* 29.1* 28.5* 24.7*  MCV  --  93.0 91.3 90.1  PLT  --  242 175 158    @IMGRELPRIORS @ Medications:    . antiseptic oral rinse  7 mL Mouth Rinse QID  . chlorhexidine  15 mL Mouth Rinse BID  . DOPamine  2-20 mcg/kg/min Intravenous Once  . feeding supplement (PRO-STAT SUGAR FREE 64)  60 mL Per Tube TID  . feeding supplement (VITAL HIGH PROTEIN)  1,000 mL Per Tube Q24H  . heparin subcutaneous  5,000 Units Subcutaneous 3 times per day  . insulin aspart  2-6 Units Subcutaneous 6 times per day  . norepinephrine (LEVOPHED) Adult infusion  2-50 mcg/min Intravenous Once  . pantoprazole (PROTONIX) IV  40 mg  Intravenous Q24H     Assessment/ Plan:   1. Complete heart block- s/p urgent transvenous pacer placement (appreciate Dr. Verl DickerGanji's care). This is out of proportion to a K of 6.4 in a dialysis patient and an other etiology will need to be investigated. Will need to obtain outpt medlist and records.  1. Cardiac workup underway, ECHO in progress 2. VDRF- per PCCM s/p intubation 1. Possible extubation later today 3. SIRS- unclear etiology, pan culture, cont with pressors and wean as tolerated, per PCCM 4. ESRD - Will continue CVVHD today due to hypotension/pressor-requirement (which has been less) and kept even last night due to dropping bp 5. Hypertension/volume - as above on pressors 6. Anemia - hold aranesp due to possible allergy 7. Metabolic bone disease - Hold hectoral for now 8. Nutrition - per PCCM 9. Vascular access- will need declot of LUE AVG once stable, currently using temp trialysis catheter 10.   Jaleia Hanke A 01/09/2014, 8:52 AM

## 2014-01-09 NOTE — Progress Notes (Signed)
Order to discontinue Temporary pacer  As long as did not resume dopamine and patient was not pacing. With pt back on sedation, this RN observed HR as low as 49 with pacer spikes noted as well as pt being paced. Dr. Jacinto HalimGanji notified with concern. Stated to lower pacer rate to 40, observe and then pull Temporary pacer.   Temporary pacer discontinued with CN in room. Pacer tip intact. Pressure held to site for 10 minutes with minimal oozing. Site covered with pressure drsg. Will continue to monitor closely.

## 2014-01-10 ENCOUNTER — Inpatient Hospital Stay (HOSPITAL_COMMUNITY): Payer: Medicare Other

## 2014-01-10 LAB — RENAL FUNCTION PANEL
ALBUMIN: 3 g/dL — AB (ref 3.5–5.2)
Albumin: 3.2 g/dL — ABNORMAL LOW (ref 3.5–5.2)
Anion gap: 15 (ref 5–15)
Anion gap: 16 — ABNORMAL HIGH (ref 5–15)
BUN: 35 mg/dL — AB (ref 6–23)
BUN: 33 mg/dL — ABNORMAL HIGH (ref 6–23)
CALCIUM: 9.4 mg/dL (ref 8.4–10.5)
CHLORIDE: 96 meq/L (ref 96–112)
CO2: 24 mEq/L (ref 19–32)
CO2: 25 meq/L (ref 19–32)
CREATININE: 3.03 mg/dL — AB (ref 0.50–1.35)
Calcium: 9.5 mg/dL (ref 8.4–10.5)
Chloride: 97 mEq/L (ref 96–112)
Creatinine, Ser: 3.16 mg/dL — ABNORMAL HIGH (ref 0.50–1.35)
GFR calc non Af Amer: 21 mL/min — ABNORMAL LOW (ref >90)
GFR, EST AFRICAN AMERICAN: 23 mL/min — AB (ref >90)
GFR, EST AFRICAN AMERICAN: 24 mL/min — AB (ref >90)
GFR, EST NON AFRICAN AMERICAN: 20 mL/min — AB (ref >90)
Glucose, Bld: 114 mg/dL — ABNORMAL HIGH (ref 70–99)
Glucose, Bld: 102 mg/dL — ABNORMAL HIGH (ref 70–99)
PHOSPHORUS: 3.3 mg/dL (ref 2.3–4.6)
Phosphorus: 3 mg/dL (ref 2.3–4.6)
Potassium: 3.4 mEq/L — ABNORMAL LOW (ref 3.7–5.3)
Potassium: 3.9 mEq/L (ref 3.7–5.3)
SODIUM: 138 meq/L (ref 137–147)
Sodium: 135 mEq/L — ABNORMAL LOW (ref 137–147)

## 2014-01-10 LAB — GLUCOSE, CAPILLARY
GLUCOSE-CAPILLARY: 102 mg/dL — AB (ref 70–99)
GLUCOSE-CAPILLARY: 108 mg/dL — AB (ref 70–99)
GLUCOSE-CAPILLARY: 112 mg/dL — AB (ref 70–99)
GLUCOSE-CAPILLARY: 88 mg/dL (ref 70–99)
Glucose-Capillary: 101 mg/dL — ABNORMAL HIGH (ref 70–99)
Glucose-Capillary: 109 mg/dL — ABNORMAL HIGH (ref 70–99)

## 2014-01-10 LAB — POCT ACTIVATED CLOTTING TIME
ACTIVATED CLOTTING TIME: 168 s
ACTIVATED CLOTTING TIME: 152 s
ACTIVATED CLOTTING TIME: 152 s
Activated Clotting Time: 152 seconds
Activated Clotting Time: 152 seconds
Activated Clotting Time: 163 seconds
Activated Clotting Time: 163 seconds
Activated Clotting Time: 163 seconds
Activated Clotting Time: 168 seconds

## 2014-01-10 LAB — BLOOD GAS, ARTERIAL
Acid-Base Excess: 1.5 mmol/L (ref 0.0–2.0)
Bicarbonate: 24.9 mEq/L — ABNORMAL HIGH (ref 20.0–24.0)
Drawn by: 331761
FIO2: 0.4 %
MECHVT: 650 mL
O2 Saturation: 96.4 %
PCO2 ART: 34.8 mmHg — AB (ref 35.0–45.0)
PEEP: 5 cmH2O
PH ART: 7.468 — AB (ref 7.350–7.450)
PO2 ART: 83.1 mmHg (ref 80.0–100.0)
Patient temperature: 98.6
RATE: 16 resp/min
TCO2: 26 mmol/L (ref 0–100)

## 2014-01-10 LAB — CBC
HCT: 24.2 % — ABNORMAL LOW (ref 39.0–52.0)
HEMOGLOBIN: 8.2 g/dL — AB (ref 13.0–17.0)
MCH: 30.4 pg (ref 26.0–34.0)
MCHC: 33.9 g/dL (ref 30.0–36.0)
MCV: 89.6 fL (ref 78.0–100.0)
PLATELETS: 153 10*3/uL (ref 150–400)
RBC: 2.7 MIL/uL — ABNORMAL LOW (ref 4.22–5.81)
RDW: 14 % (ref 11.5–15.5)
WBC: 4.5 10*3/uL (ref 4.0–10.5)

## 2014-01-10 LAB — APTT: APTT: 72 s — AB (ref 24–37)

## 2014-01-10 LAB — MAGNESIUM: MAGNESIUM: 2.3 mg/dL (ref 1.5–2.5)

## 2014-01-10 MED ORDER — PRISMASOL BGK 0/2.5 32-2.5 MEQ/L IV SOLN
INTRAVENOUS | Status: DC
  Administered 2014-01-10 – 2014-01-11 (×2): via INTRAVENOUS_CENTRAL
  Filled 2014-01-10 (×6): qty 5000

## 2014-01-10 MED ORDER — RACEPINEPHRINE HCL 2.25 % IN NEBU
0.2500 mL | INHALATION_SOLUTION | Freq: Once | RESPIRATORY_TRACT | Status: AC
  Administered 2014-01-10: 0.25 mL via RESPIRATORY_TRACT

## 2014-01-10 MED ORDER — RACEPINEPHRINE HCL 2.25 % IN NEBU
INHALATION_SOLUTION | RESPIRATORY_TRACT | Status: AC
  Administered 2014-01-10: 0.25 mL via RESPIRATORY_TRACT
  Filled 2014-01-10: qty 0.5

## 2014-01-10 MED ORDER — CETYLPYRIDINIUM CHLORIDE 0.05 % MT LIQD
7.0000 mL | Freq: Two times a day (BID) | OROMUCOSAL | Status: DC
  Administered 2014-01-10 – 2014-01-11 (×4): 7 mL via OROMUCOSAL

## 2014-01-10 MED ORDER — PRISMASOL BGK 4/2.5 32-4-2.5 MEQ/L IV SOLN
INTRAVENOUS | Status: DC
  Administered 2014-01-10 – 2014-01-11 (×7): via INTRAVENOUS_CENTRAL
  Filled 2014-01-10 (×14): qty 5000

## 2014-01-10 MED ORDER — CHLORHEXIDINE GLUCONATE 0.12 % MT SOLN
15.0000 mL | Freq: Two times a day (BID) | OROMUCOSAL | Status: DC
  Administered 2014-01-10 – 2014-01-11 (×2): 15 mL via OROMUCOSAL
  Filled 2014-01-10 (×2): qty 15

## 2014-01-10 NOTE — Progress Notes (Signed)
Dr. Belinda Block notified of stridor. Pt denies shortness of breath or any difficulty breathing.  Order for racemic epi treatment received.  RT notified.  Will continue to monitor pt closely.

## 2014-01-10 NOTE — Procedures (Signed)
Extubation Procedure Note  Patient Details:   Name: Andrew [name] DOB: [birth year] MRN: [id number]   Airway Documentation:  Airway 7.5 mm (Active)  Secured at (cm) 25 cm 01/10/2014  8:00 AM  Measured From Lips 01/10/2014  8:00 AM  Secured Location Left 01/10/2014  7:57 AM  Secured By [employer] Tube Holder 01/10/2014  7:57 AM  Tube Holder Repositioned Yes 01/10/2014  7:57 AM  Cuff Pressure (cm H2O) 26 cm H2O 01/10/2014  3:19 AM  Site Condition Dry 01/10/2014  8:00 AM    Evaluation  O2 sats: stable throughout Complications: No apparent complications Patient did tolerate procedure well. Bilateral Breath Sounds: Rhonchi;Diminished Suctioning: Airway Yes  Pt extubated per MD order.  Sats 98% on 4l [place].  RT will continue to monitor.  Closson, Terie PurserMorgan Romain Erion 01/10/2014, 10:56 AM

## 2014-01-10 NOTE — Progress Notes (Signed)
PULMONARY / CRITICAL CARE MEDICINE   Name: Andrew [name] MRN: [id number] DOB: [birth year]    ADMISSION DATE:  01/07/2014  REFERRING MD :  EDP  CHIEF COMPLAINT:  Bradycardia, SOB  INITIAL PRESENTATION: 63 yo male with hx ESRD, CAD admitted 8/19 with severe symptomatic bradycardia and dyspnea.   STUDIES:  2D echo 8/19>>> EF 55-60%, B atria moderately dilated,   SIGNIFICANT EVENTS: 8/19 hyperkalemic cardiac arrest s/p pacer placement  SUBJECTIVE:   Improved BP Tolerating PSV this am  Has thick secretions but strong cough  VITAL SIGNS: Temp:  [97.3 F (36.3 C)-98.8 F (37.1 C)] 97.3 F (36.3 C) (08/22 0753) Pulse Rate:  [51-100] 69 (08/22 0900) Resp:  [5-27] 18 (08/22 0900) BP: (85-143)/(43-70) 98/48 mmHg (08/22 0757) SpO2:  [96 %-100 %] 100 % (08/22 0900) Arterial Line BP: (76-145)/(36-72) 145/64 mmHg (08/22 0900) FiO2 (%):  [40 %-50 %] 40 % (08/22 0800) Weight:  [113.8 kg (250 lb 14.1 oz)] 113.8 kg (250 lb 14.1 oz) (08/22 0413) HEMODYNAMICS:   VENTILATOR SETTINGS:  Vent Mode:  [-] CPAP;PSV FiO2 (%):  [40 %-50 %] 40 % Set Rate:  [16 bmp] 16 bmp Vt Set:  [650 mL] 650 mL PEEP:  [5 cmH20] 5 cmH20 Pressure Support:  [5 cmH20] 5 cmH20 Plateau Pressure:  [17 cmH20-21 cmH20] 19 cmH20 INTAKE / OUTPUT:  Intake/Output Summary (Last 24 hours) at 01/10/14 1042 Last data filed at 01/10/14 1000  Gross per 24 hour  Intake 1330.65 ml  Output   1771 ml  Net -440.35 ml   PHYSICAL EXAMINATION: General:  Obese, chronically ill appearing male, acutely ill  Neuro:  Awake, MAE, follows commands, following commands HEENT:  Mm dry, no JVD Cardiovascular:  S1s2, severe bradycardia (20's), distant  Lungs:  resps even, mildly labored, diminished, few scattered rales  Abdomen:  Obese, soft, +bs Musculoskeletal:  Cool, clammy, scant BLE edema   LABS:  CBC  Recent Labs Lab 01/08/14 0415 01/09/14 0400 01/10/14 0404  WBC 5.8 5.0 4.5  HGB 9.7* 8.5* 8.2*  HCT 28.5* 24.7* 24.2*   PLT 175 158 153   Coag's  Recent Labs Lab 01/07/14 1243 01/08/14 0415 01/09/14 0400 01/10/14 0404  APTT  --  55* 84* 72*  INR 1.17  --   --   --    BMET  Recent Labs Lab 01/09/14 0400 01/09/14 1500 01/10/14 0404  NA 134* 137 135*  K 4.2 3.9 3.4*  CL 95* 97 96  CO2 24 24 24   BUN 42* 35* 35*  CREATININE 5.13* 4.02* 3.16*  GLUCOSE 118* 109* 114*   Electrolytes  Recent Labs Lab 01/08/14 0415  01/09/14 0400 01/09/14 1500 01/10/14 0404  CALCIUM 9.9  < > 9.4 9.3 9.5  MG 2.0  --  2.2  --  2.3  PHOS 3.9  < > 3.8 3.3 3.0  < > = values in this interval not displayed. Sepsis Markers No results found for this basename: LATICACIDVEN, PROCALCITON, O2SATVEN,  in the last 168 hours  ABG  Recent Labs Lab 01/07/14 1721 01/09/14 0410 01/10/14 0405  PHART 7.267* 7.430 7.468*  PCO2ART 39.0 40.4 34.8*  PO2ART 93.0 76.1* 83.1   Liver Enzymes  Recent Labs Lab 01/07/14 1243  01/09/14 0400 01/09/14 1500 01/10/14 0404  AST 16  --   --   --   --   ALT 12  --   --   --   --   ALKPHOS 226*  --   --   --   --  BILITOT 0.4  --   --   --   --   ALBUMIN 3.9  < > 3.2* 3.2* 3.0*  < > = values in this interval not displayed. Cardiac Enzymes  Recent Labs Lab 01/07/14 1445 01/07/14 2045 01/08/14 0245  TROPONINI <0.30 0.33* 0.46*   Glucose  Recent Labs Lab 01/09/14 1122 01/09/14 1545 01/09/14 1933 01/09/14 2350 01/10/14 0401 01/10/14 0754  GLUCAP 103* 96 112* 104* 108* 112*   Imaging Dg Chest Port 1 View  01/09/2014   CLINICAL DATA:  CHF.  EXAM: PORTABLE CHEST - 1 VIEW  COMPARISON:  01/08/2014.  FINDINGS: Endotracheal tube, NG tube, right IJ line in stable position. Heart size stable. Pulmonary vascularity normal on today's exam. Interim partial clearing of bilateral pulmonary infiltrates. No significant pleural effusion or pneumothorax. No acute osseous abnormality.  IMPRESSION: 1. Line and tube positions stable . 2. Interim partial clearing of bilateral  pulmonary infiltrates. This suggests partial clearing of congestive heart failure.   Electronically Signed   By: Maisie Fus  Register   On: 01/09/2014 07:48   ASSESSMENT / PLAN:  PULMONARY OETT 8/19>>> 8/22 Acute respiratory failure - r/t volume overload  P:   - Believe he can extubate today 8/22, will push pulm hygiene - Volume even via CRRT. - PRN BD. - follow CXR   CARDIOVASCULAR R fem cortis 8/19>>> Severe symptomatic bradycardia - presumed r/t meds + hyperkalemia Hx CAD  P:  - Temp pacer out 8/21 - HD per renal - see below. - F/u echo per cards. - Will need coumadin once off vent, maintain on heparin for a-fib for now.  RENAL ESRD  Hyperkalemia  Metabolic acidosis - improved P:   - CVVHD will likely end 8/22, then will need to de-clot his graft, move to intermittent HD  - follow BMP - Replace electrolytes as indicated.  GASTROINTESTINAL No active issue  P:   - PPI. - assess for PO after extubation  HEMATOLOGIC Anemia - mild  P:  - F/u cbc. - SQ heparin.  INFECTIOUS No known source infection  P:   - Monitor fever, wbc curve off abx.  ENDOCRINE Hyperglycemia - mild, no known hx DM P:   - Monitor.  NEUROLOGIC AMS - in setting symptomatic bradycardia, improved P:   - RASS goal: 0.  TODAY'S SUMMARY: plan ext7ubate 8/22, probably transition soon to IHD  CC time 35 minutes.  Levy Pupa, MD, PhD 01/10/2014, 10:47 AM  Pulmonary and Critical Care [phone] or if no answer [phone]

## 2014-01-10 NOTE — Progress Notes (Signed)
Chaplain Note: Provided spiritual and emotional support and ministry of presence for patient and his his friends Rutherford NailLeah Smith, [address]

## 2014-01-10 NOTE — Progress Notes (Signed)
Pt states he feels better after the treatment.  Pt has less stridor upon auscultation.  Will continue to monitor pt closely.

## 2014-01-10 NOTE — Progress Notes (Signed)
Patient ID: Maudry DiegoHerbert B Ramsay, male   DOB: Mar 25, 1951, 63 y.o.   MRN: [id number]  [facility] Progress Note    Subjective:   Intubated but awake/alert   Objective:   BP 98/48  Pulse 69  Temp(Src) 97.3 F (36.3 C) (Oral)  Resp 18  Ht 5\' 4"  (1.626 m)  Wt 113.8 kg (250 lb 14.1 oz)  BMI 43.04 kg/m2  SpO2 100%  Intake/Output: I/O last 3 completed shifts: In: 2378.3 [I.V.:1308.3; NG/GT:1070] Out: 2799 [Other:2799]   Intake/Output this shift:  Total I/O In: 74.1 [I.V.:34.1; NG/GT:40] Out: 190 [Other:190] Weight change: -2.5 kg (-5 lb 8.2 oz)  Physical Exam: Gen:WD obese AAM intubated, awake CVS:no rub Resp:cta NUU:VOZDGUAbd:benign Ext:no edema, LUE AVG without T/B  Labs: BMET  Recent Labs Lab 01/07/14 1241 01/07/14 1243 01/08/14 0415 01/08/14 1500 01/09/14 0400 01/09/14 1500 01/10/14 0404  NA 134* 138 136* 136* 134* 137 135*  K 6.6* 6.8* 5.5* 6.3* 4.2 3.9 3.4*  CL 105 95* 96 99 95* 97 96  CO2  --  18* 21 23 24 24 24   GLUCOSE 169* 167* 132* 125* 118* 109* 114*  BUN 123* 106* 75* 48* 42* 35* 35*  CREATININE 16.60* 15.14* 10.24* 7.08* 5.13* 4.02* 3.16*  ALBUMIN  --  3.9 3.6 3.5 3.2* 3.2* 3.0*  CALCIUM  --  9.8 9.9 9.3 9.4 9.3 9.5  PHOS  --   --  3.9 3.5 3.8 3.3 3.0   CBC  Recent Labs Lab 01/07/14 1241 01/07/14 1243 01/08/14 0415 01/09/14 0400 01/10/14 0404  WBC  --  9.5 5.8 5.0 4.5  NEUTROABS  --  5.2  --   --   --   HGB 10.5* 9.5* 9.7* 8.5* 8.2*  HCT 31.0* 29.1* 28.5* 24.7* 24.2*  MCV  --  93.0 91.3 90.1 89.6  PLT  --  242 175 158 153    @IMGRELPRIORS @ Medications:    . antiseptic oral rinse  7 mL Mouth Rinse QID  . chlorhexidine  15 mL Mouth Rinse BID  . DOPamine  2-20 mcg/kg/min Intravenous Once  . feeding supplement (PRO-STAT SUGAR FREE 64)  60 mL Per Tube TID  . feeding supplement (VITAL HIGH PROTEIN)  1,000 mL Per Tube Q24H  . heparin subcutaneous  5,000 Units Subcutaneous 3 times per day  . insulin aspart  2-6 Units Subcutaneous  6 times per day  . norepinephrine (LEVOPHED) Adult infusion  2-50 mcg/min Intravenous Once  . pantoprazole (PROTONIX) IV  40 mg Intravenous Q24H     Assessment/ Plan:   1. Complete heart block- s/p urgent transvenous pacer placement (appreciate Dr. Verl DickerGanji's care). This is out of proportion to a K of 6.4 in a dialysis patient and an other etiology will need to be investigated. Will need to obtain outpt medlist and records.  1. Cardiac workup underway, ECHO in progress 2. S/p d/c of temp pacer 2. VDRF- per PCCM s/p intubation  1. Possible extubation later today 3. SIRS- unclear etiology,off pressors but BP soft.  per PCCM 4. ESRD - Will continue CVVHD today due to borderline bp and possible extubation. 1. Will change to 4K/2.5 dialysate given drop in serum K level 2. Will transition to IHD over the next 24 hours  5. Hypertension/volume - as above on pressors 6. Anemia - hold aranesp due to possible allergy 7. Metabolic bone disease - Hold hectoral for now 8. Nutrition - per PCCM 9. Vascular access- will need declot of LUE AVG once stable, currently using temp trialysis  catheter.  Appreciate IR assistance  10.   Shanette Tamargo A 01/10/2014, 10:30 AM

## 2014-01-10 NOTE — Progress Notes (Signed)
eLink Physician-Brief Progress Note Patient Name: Andrew DiegoHerbert B [name] DOB: 05-Jan-1951 MRN: [id number]   Date of Service  01/10/2014  HPI/Events of Note  Stridor post extubation  eICU Interventions  racemic     Intervention Category Intermediate Interventions: OtherCarolan Clines:  GIDDINGS, OLIVIA K. 01/10/2014, 6:21 PM

## 2014-01-11 ENCOUNTER — Encounter (HOSPITAL_COMMUNITY): Payer: Self-pay | Admitting: *Deleted

## 2014-01-11 LAB — CBC
HCT: 24.7 % — ABNORMAL LOW (ref 39.0–52.0)
Hemoglobin: 8.4 g/dL — ABNORMAL LOW (ref 13.0–17.0)
MCH: 30.9 pg (ref 26.0–34.0)
MCHC: 34 g/dL (ref 30.0–36.0)
MCV: 90.8 fL (ref 78.0–100.0)
Platelets: 162 10*3/uL (ref 150–400)
RBC: 2.72 MIL/uL — AB (ref 4.22–5.81)
RDW: 14 % (ref 11.5–15.5)
WBC: 5.5 10*3/uL (ref 4.0–10.5)

## 2014-01-11 LAB — POCT ACTIVATED CLOTTING TIME
ACTIVATED CLOTTING TIME: 157 s
ACTIVATED CLOTTING TIME: 163 s
Activated Clotting Time: 157 seconds
Activated Clotting Time: 163 seconds
Activated Clotting Time: 163 seconds

## 2014-01-11 LAB — GLUCOSE, CAPILLARY
GLUCOSE-CAPILLARY: 94 mg/dL (ref 70–99)
GLUCOSE-CAPILLARY: 94 mg/dL (ref 70–99)
Glucose-Capillary: 84 mg/dL (ref 70–99)
Glucose-Capillary: 92 mg/dL (ref 70–99)

## 2014-01-11 LAB — RENAL FUNCTION PANEL
ALBUMIN: 2.9 g/dL — AB (ref 3.5–5.2)
Anion gap: 12 (ref 5–15)
BUN: 26 mg/dL — AB (ref 6–23)
CO2: 27 mEq/L (ref 19–32)
CREATININE: 2.86 mg/dL — AB (ref 0.50–1.35)
Calcium: 9.5 mg/dL (ref 8.4–10.5)
Chloride: 99 mEq/L (ref 96–112)
GFR calc Af Amer: 26 mL/min — ABNORMAL LOW (ref >90)
GFR, EST NON AFRICAN AMERICAN: 22 mL/min — AB (ref >90)
Glucose, Bld: 98 mg/dL (ref 70–99)
PHOSPHORUS: 3 mg/dL (ref 2.3–4.6)
Potassium: 3.7 mEq/L (ref 3.7–5.3)
Sodium: 138 mEq/L (ref 137–147)

## 2014-01-11 LAB — APTT: aPTT: 64 seconds — ABNORMAL HIGH (ref 24–37)

## 2014-01-11 LAB — MAGNESIUM: MAGNESIUM: 2.4 mg/dL (ref 1.5–2.5)

## 2014-01-11 MED ORDER — PANTOPRAZOLE SODIUM 40 MG PO TBEC
40.0000 mg | DELAYED_RELEASE_TABLET | Freq: Every day | ORAL | Status: DC
  Administered 2014-01-11: 40 mg via ORAL
  Filled 2014-01-11: qty 1

## 2014-01-11 MED ORDER — HEPARIN SODIUM (PORCINE) 1000 UNIT/ML IJ SOLN
2.4000 mL | Freq: Once | INTRAMUSCULAR | Status: AC
  Administered 2014-01-11: 2400 [IU]

## 2014-01-11 MED ORDER — MIDODRINE HCL 5 MG PO TABS
5.0000 mg | ORAL_TABLET | Freq: Two times a day (BID) | ORAL | Status: DC
  Filled 2014-01-11 (×2): qty 1

## 2014-01-11 NOTE — Progress Notes (Addendum)
PULMONARY / CRITICAL CARE MEDICINE   Name: Andrew [name] MRN: [id number] DOB: [birth year]    ADMISSION DATE:  01/07/2014  REFERRING MD :  EDP  CHIEF COMPLAINT:  Bradycardia, SOB  INITIAL PRESENTATION: 63 yo male with hx ESRD, CAD admitted 8/19 with severe symptomatic bradycardia and dyspnea.   STUDIES:  2D echo 8/19>>> EF 55-60%, B atria moderately dilated,   SIGNIFICANT EVENTS: 8/19 brady cardiac arrest s/p pacer placement  SUBJECTIVE:   Tolerated extubation, received racemic epi x 1, has some sore throat CVVHD still running  VITAL SIGNS: Temp:  [98 F (36.7 C)-98.8 F (37.1 C)] 98.8 F (37.1 C) (08/23 0400) Pulse Rate:  [59-101] 71 (08/23 0900) Resp:  [14-25] 18 (08/23 0900) BP: (96)/(40) 96/40 mmHg (08/22 1200) SpO2:  [90 %-100 %] 96 % (08/23 0900) Arterial Line BP: (89-138)/(41-67) 121/60 mmHg (08/23 0900) Weight:  [112.4 kg (247 lb 12.8 oz)] 112.4 kg (247 lb 12.8 oz) (08/23 0500) HEMODYNAMICS:   VENTILATOR SETTINGS:    INTAKE / OUTPUT:  Intake/Output Summary (Last 24 hours) at 01/11/14 0931 Last data filed at 01/11/14 0900  Gross per 24 hour  Intake    265 ml  Output    514 ml  Net   -249 ml   PHYSICAL EXAMINATION: General:  Obese, chronically ill appearing male Neuro:  Awake, MAE, follows commands HEENT:  Mm dry, no JVD Cardiovascular:  S1s2, regular, 70's Lungs:  resps even, diminished, few scattered rales  Abdomen:  Obese, soft, +bs Musculoskeletal:  Cool, clammy, trace BLE edema   LABS:  CBC  Recent Labs Lab 01/09/14 0400 01/10/14 0404 01/11/14 0410  WBC 5.0 4.5 5.5  HGB 8.5* 8.2* 8.4*  HCT 24.7* 24.2* 24.7*  PLT 158 153 162   Coag's  Recent Labs Lab 01/07/14 1243  01/09/14 0400 01/10/14 0404 01/11/14 0410  APTT  --   < > 84* 72* 64*  INR 1.17  --   --   --   --   < > = values in this interval not displayed. BMET  Recent Labs Lab 01/10/14 0404 01/10/14 1558 01/11/14 0410  NA 135* 138 138  K 3.4* 3.9 3.7  CL 96 97 99   CO2 BUN 35* 33* 26*  CREATININE 3.16* 3.03* 2.86*  GLUCOSE 114* 102* 98   Electrolytes  Recent Labs Lab 01/09/14 0400  01/10/14 0404 01/10/14 1558 01/11/14 0410  CALCIUM 9.4  < > 9.5 9.4 9.5  MG 2.2  --  2.3  --  2.4  PHOS 3.8  < > 3.0 3.3 3.0  < > = values in this interval not displayed. Sepsis Markers No results found for this basename: LATICACIDVEN, PROCALCITON, O2SATVEN,  in the last 168 hours  ABG  Recent Labs Lab 01/07/14 1721 01/09/14 0410 01/10/14 0405  PHART 7.267* 7.430 7.468*  PCO2ART 39.0 40.4 34.8*  PO2ART 93.0 76.1* 83.1   Liver Enzymes  Recent Labs Lab 01/07/14 1243  01/10/14 0404 01/10/14 1558 01/11/14 0410  AST 16  --   --   --   --   ALT 12  --   --   --   --   ALKPHOS 226*  --   --   --   --   BILITOT 0.4  --   --   --   --   ALBUMIN 3.9  < > 3.0* 3.2* 2.9*  < > = values in this interval not displayed. Cardiac Enzymes  Recent Labs Lab 01/07/14  1445 01/07/14 2045 01/08/14 0245  TROPONINI <0.30 0.33* 0.46*   Glucose  Recent Labs Lab 01/10/14 0754 01/10/14 1158 01/10/14 1559 01/10/14 2016 01/10/14 2330 01/11/14 0354  GLUCAP 112* 102* 101* 109* 88 94   Imaging Dg Chest Port 1 View  01/10/2014   CLINICAL DATA:  Endotracheal tube position.  EXAM: PORTABLE CHEST - 1 VIEW  COMPARISON:  01/09/2014 and 01/08/2014.  FINDINGS: 0547 hr. The endotracheal tube tip is unchanged within the mid trachea. Right IJ central venous catheter and nasogastric tube remain in place. There is no pneumothorax. There are lower lung volumes with increased bibasilar airspace opacities. No significant pleural effusion is seen. The heart size and mediastinal contours are stable.  IMPRESSION: 1. Stable position of the support system. 2. Worsening bibasilar airspace opacities, probably reflecting atelectasis. Aspiration not excluded.   Electronically Signed   By: Roxy Horseman M.D.   On: 01/10/2014 08:44   ASSESSMENT / PLAN:  PULMONARY OETT 8/19>>>  8/22 Acute respiratory failure - r/t volume overload and MS, hemodynamic status P:   - Volume even via HD. - PRN BD. - follow CXR - mobilize   CARDIOVASCULAR R fem cortis 8/19>>> Severe symptomatic bradycardia - presumed r/t hyperkalemia +/- meds? Hx CAD  P:  - Temp pacer out 8/21 - HD per renal - see below. - Will need coumadin soon, maintain on heparin for a-fib for now until graft declotted. - Restarting midodrine  RENAL ESRD  Hyperkalemia  Metabolic acidosis - improved P:   - CVVHD will end 8/23, then will need to de-clot his graft (planned 8/24), move to intermittent HD  - follow BMP - Replace electrolytes as indicated.  GASTROINTESTINAL No active issue  P:   - PPI. - start diet 8/23  HEMATOLOGIC Anemia - mild  P:  - F/u cbc. - heparin gtt  INFECTIOUS No known source infection  P:   - Monitor fever, wbc curve off abx.  ENDOCRINE Hyperglycemia - mild, no known hx DM P:   - Monitor.  NEUROLOGIC AMS - in setting symptomatic bradycardia, improved P:   - RASS goal: 0.  TODAY'S SUMMARY: plan to transition off CVVHD, mobilize. Will need graft declot 8/24. To SDU when off CVVHD. Plan A line out    Levy Pupa, MD, PhD 01/11/2014, 9:31 AM [facility] [phone] or if no answer [phone]

## 2014-01-11 NOTE — Plan of Care (Signed)
Problem: Phase III Progression Outcomes Goal: Transfer/discharge plan in place Outcome: Completed/Met Date Met:  01/11/14 Back to SNF upon discharge.

## 2014-01-11 NOTE — Progress Notes (Signed)
Chaplain Note: Prayed with patient and provided faith affirmation and encouragement Rutherford Nail, [address]

## 2014-01-11 NOTE — Progress Notes (Signed)
Patient ID: Andrew [name], male   DOB: 1951-04-11, 63 y.o.   MRN: [id number]  [facility] Progress Note    Subjective:   Pt extubated and feels well.   Objective:   BP 96/40  Pulse 71  Temp(Src) 98.8 F (37.1 C) (Oral)  Resp 18  Ht  (1.626 m)  Wt 112.4 kg (247 lb 12.8 oz)  BMI 42.51 kg/m2  SpO2 96%  Intake/Output: I/O last 3 completed shifts: In: 987.1 [I.V.:572.1; NG/GT:415] Out: 1427 [Other:1427]   Intake/Output this shift:  Total I/O In: 10 [I.V.:10] Out: 24 [Other:24] Weight change: -1.4 kg (-3 lb 1.4 oz)  Physical Exam: Gen:WD obese AAM in NAD CVS:no rub Resp:cta MVH:QIONG, +BS, soft, NT Ext:no edema  Labs: BMET  Recent Labs Lab 01/08/14 0415 01/08/14 1500 01/09/14 0400 01/09/14 1500 01/10/14 0404 01/10/14 1558 01/11/14 0410  NA 136* 136* 134* 137 135* 138 138  K 5.5* 6.3* 4.2 3.9 3.4* 3.9 3.7  CL 96 99 95* 97 96 97 99  CO2 GLUCOSE 132* 125* 118* 109* 114* 102* 98  BUN 75* 48* 42* 35* 35* 33* 26*  CREATININE 10.24* 7.08* 5.13* 4.02* 3.16* 3.03* 2.86*  ALBUMIN 3.6 3.5 3.2* 3.2* 3.0* 3.2* 2.9*  CALCIUM 9.9 9.3 9.4 9.3 9.5 9.4 9.5  PHOS 3.9 3.5 3.8 3.3 3.0 3.3 3.0   CBC  Recent Labs Lab 01/07/14 1241  01/07/14 1243 01/08/14 0415 01/09/14 0400 01/10/14 0404 01/11/14 0410  WBC  --   < > 9.5 5.8 5.0 4.5 5.5  NEUTROABS  --   --  5.2  --   --   --   --   HGB 10.5*  --  9.5* 9.7* 8.5* 8.2* 8.4*  HCT 31.0*  --  29.1* 28.5* 24.7* 24.2* 24.7*  MCV  --   < > 93.0 91.3 90.1 89.6 90.8  PLT  --   < > 242 175 158 153 162  < > = values in this interval not displayed.  @ Medications:    . antiseptic oral rinse  7 mL Mouth Rinse q12n4p  . chlorhexidine  15 mL Mouth Rinse BID  . DOPamine  2-20 mcg/kg/min Intravenous Once  . heparin subcutaneous  5,000 Units Subcutaneous 3 times per day  . insulin aspart  2-6 Units Subcutaneous 6 times per day  . pantoprazole (PROTONIX) IV  40 mg  Intravenous Q24H     Assessment/ Plan:   1. Complete heart block- s/p urgent transvenous pacer placement (appreciate Dr. Verl Dicker care). This is out of proportion to a K of 6.4 in a dialysis patient and an other etiology will need to be investigated.   1. Cardiac workup underway, ECHO EF 55-60% 2. S/p d/c of temp pacer 2. VDRF- per PCCM s/p extubation  3. SIRS- unclear etiology,off pressors but BP soft. per PCCM 4. ESRD - Will discontinue CVVHD and transition to IHD.  5. Hypertension/volume - as above off pressors. Has h/o hypotension and is normally on midodrine  daily and will resume 6. Anemia - hold aranesp due to possible allergy 7. Metabolic bone disease - Hold hectoral for now 8. Nutrition - per PCCM 9. Vascular access- will need declot of LUE AVG once stable, currently using temp trialysis catheter. Appreciate IR assistance  10.   Andrew [name] A 01/11/2014, 9:49 AM

## 2014-01-12 ENCOUNTER — Encounter (HOSPITAL_COMMUNITY): Payer: Self-pay | Admitting: Radiology

## 2014-01-12 DIAGNOSIS — I4891 Unspecified atrial fibrillation: Secondary | ICD-10-CM

## 2014-01-12 DIAGNOSIS — K2100 Gastro-esophageal reflux disease with esophagitis, without bleeding: Secondary | ICD-10-CM

## 2014-01-12 DIAGNOSIS — E669 Obesity, unspecified: Secondary | ICD-10-CM

## 2014-01-12 LAB — RENAL FUNCTION PANEL
ALBUMIN: 2.8 g/dL — AB (ref 3.5–5.2)
ANION GAP: 13 (ref 5–15)
BUN: 43 mg/dL — AB (ref 6–23)
CHLORIDE: 100 meq/L (ref 96–112)
CO2: 26 meq/L (ref 19–32)
Calcium: 9.3 mg/dL (ref 8.4–10.5)
Creatinine, Ser: 5.21 mg/dL — ABNORMAL HIGH (ref 0.50–1.35)
GFR calc Af Amer: 12 mL/min — ABNORMAL LOW (ref >90)
GFR calc non Af Amer: 11 mL/min — ABNORMAL LOW (ref >90)
GLUCOSE: 87 mg/dL (ref 70–99)
POTASSIUM: 4.1 meq/L (ref 3.7–5.3)
Phosphorus: 4.6 mg/dL (ref 2.3–4.6)
Sodium: 139 mEq/L (ref 137–147)

## 2014-01-12 LAB — CBC
HCT: 23.8 % — ABNORMAL LOW (ref 39.0–52.0)
Hemoglobin: 8.2 g/dL — ABNORMAL LOW (ref 13.0–17.0)
MCH: 31.1 pg (ref 26.0–34.0)
MCHC: 34.5 g/dL (ref 30.0–36.0)
MCV: 90.2 fL (ref 78.0–100.0)
PLATELETS: 175 10*3/uL (ref 150–400)
RBC: 2.64 MIL/uL — ABNORMAL LOW (ref 4.22–5.81)
RDW: 13.8 % (ref 11.5–15.5)
WBC: 5.8 10*3/uL (ref 4.0–10.5)

## 2014-01-12 LAB — GLUCOSE, CAPILLARY: Glucose-Capillary: 93 mg/dL (ref 70–99)

## 2014-01-12 MED ORDER — SODIUM CHLORIDE 0.9 % IV SOLN: 100.0000 mL | INTRAVENOUS | Status: DC | PRN

## 2014-01-12 MED ORDER — CALCIUM ACETATE 667 MG PO CAPS
2001.0000 mg | ORAL_CAPSULE | Freq: Three times a day (TID) | ORAL | Status: DC
  Administered 2014-01-12 – 2014-01-15 (×5): 2001 mg via ORAL
  Filled 2014-01-12 (×13): qty 3

## 2014-01-12 MED ORDER — ALTEPLASE 100 MG IV SOLR
4.0000 mg | Freq: Once | INTRAVENOUS | Status: DC
  Filled 2014-01-12: qty 4

## 2014-01-12 MED ORDER — HEPARIN SODIUM (PORCINE) 1000 UNIT/ML DIALYSIS
7000.0000 [IU] | INTRAMUSCULAR | Status: DC | PRN
  Filled 2014-01-12: qty 7

## 2014-01-12 MED ORDER — LIDOCAINE-PRILOCAINE 2.5-2.5 % EX CREA: 1.0000 "application " | TOPICAL_CREAM | CUTANEOUS | Status: DC | PRN

## 2014-01-12 MED ORDER — ACETAMINOPHEN 325 MG PO TABS
650.0000 mg | ORAL_TABLET | ORAL | Status: DC | PRN
  Administered 2014-01-12 – 2014-01-14 (×5): 650 mg via ORAL
  Filled 2014-01-12 (×5): qty 2

## 2014-01-12 MED ORDER — MIDODRINE HCL 5 MG PO TABS
5.0000 mg | ORAL_TABLET | ORAL | Status: DC
Start: 2014-01-12 — End: 2014-01-15
  Administered 2014-01-12 – 2014-01-14 (×5): 5 mg via ORAL
  Filled 2014-01-12 (×5): qty 1

## 2014-01-12 MED ORDER — ALTEPLASE 2 MG IJ SOLR
2.0000 mg | Freq: Once | INTRAMUSCULAR | Status: AC | PRN
  Filled 2014-01-12: qty 2

## 2014-01-12 MED ORDER — POLYETHYLENE GLYCOL 3350 17 G PO PACK
17.0000 g | PACK | Freq: Every day | ORAL | Status: DC | PRN
  Filled 2014-01-12: qty 1

## 2014-01-12 MED ORDER — ASPIRIN EC 81 MG PO TBEC
81.0000 mg | DELAYED_RELEASE_TABLET | Freq: Every day | ORAL | Status: DC
  Administered 2014-01-12 – 2014-01-15 (×4): 81 mg via ORAL
  Filled 2014-01-12 (×4): qty 1

## 2014-01-12 MED ORDER — DIPHENHYDRAMINE HCL 50 MG/ML IJ SOLN
12.5000 mg | Freq: Once | INTRAMUSCULAR | Status: DC
  Filled 2014-01-12: qty 1

## 2014-01-12 MED ORDER — PENTAFLUOROPROP-TETRAFLUOROETH EX AERO: 1.0000 "application " | INHALATION_SPRAY | CUTANEOUS | Status: DC | PRN

## 2014-01-12 MED ORDER — MIDODRINE HCL 5 MG PO TABS: 5.0000 mg | ORAL_TABLET | ORAL | Status: DC

## 2014-01-12 MED ORDER — NEPRO/CARBSTEADY PO LIQD: 237.0000 mL | ORAL | Status: DC | PRN

## 2014-01-12 MED ORDER — ATORVASTATIN CALCIUM 10 MG PO TABS
10.0000 mg | ORAL_TABLET | Freq: Every day | ORAL | Status: DC
  Administered 2014-01-12 – 2014-01-14 (×2): 10 mg via ORAL
  Filled 2014-01-12 (×4): qty 1

## 2014-01-12 MED ORDER — OXYBUTYNIN CHLORIDE ER 5 MG PO TB24
5.0000 mg | ORAL_TABLET | Freq: Two times a day (BID) | ORAL | Status: DC
  Administered 2014-01-12 – 2014-01-15 (×7): 5 mg via ORAL
  Filled 2014-01-12 (×8): qty 1

## 2014-01-12 MED ORDER — HEPARIN SODIUM (PORCINE) 1000 UNIT/ML DIALYSIS
1000.0000 [IU] | INTRAMUSCULAR | Status: DC | PRN
  Filled 2014-01-12: qty 1

## 2014-01-12 MED ORDER — PANTOPRAZOLE SODIUM 40 MG PO TBEC
40.0000 mg | DELAYED_RELEASE_TABLET | Freq: Every day | ORAL | Status: DC
  Administered 2014-01-12 – 2014-01-15 (×4): 40 mg via ORAL
  Filled 2014-01-12 (×4): qty 1

## 2014-01-12 MED ORDER — ARTIFICIAL TEARS OP OINT: 1.0000 "application " | TOPICAL_OINTMENT | OPHTHALMIC | Status: DC | PRN

## 2014-01-12 MED ORDER — SENNOSIDES-DOCUSATE SODIUM 8.6-50 MG PO TABS
1.0000 | ORAL_TABLET | Freq: Two times a day (BID) | ORAL | Status: DC
  Administered 2014-01-12 – 2014-01-15 (×6): 1 via ORAL
  Filled 2014-01-12 (×8): qty 1

## 2014-01-12 MED ORDER — MIDODRINE HCL 5 MG PO TABS
15.0000 mg | ORAL_TABLET | ORAL | Status: DC
Start: 2014-01-13 — End: 2014-01-15
  Administered 2014-01-13 – 2014-01-15 (×3): 15 mg via ORAL
  Filled 2014-01-12 (×6): qty 3

## 2014-01-12 MED ORDER — LIDOCAINE HCL (PF) 1 % IJ SOLN: 5.0000 mL | INTRAMUSCULAR | Status: DC | PRN

## 2014-01-12 NOTE — Care Management Note (Signed)
    Page 1 of 1   01/12/2014     9:56:32 AM CARE MANAGEMENT NOTE 01/12/2014  Patient:  Andrew [name], Andrew [name]   Account Number:  [id number]  Date Initiated:  01/07/2014  Documentation initiated by:  Junius Creamer  Subjective/Objective Assessment:   adm w brady     Action/Plan:   from home   Anticipated DC Date:     Anticipated DC Plan:           Choice offered to / List presented to:             Status of service:   Medicare Important Message given?  YES (If response is "NO", the following Medicare IM given date fields will be blank) Date Medicare IM given:  01/12/2014 Medicare IM given by:  Junius Creamer Date Additional Medicare IM given:   Additional Medicare IM given by:    Discharge Disposition:    Per UR Regulation:  Reviewed for med. necessity/level of care/duration of stay  If discussed at Long Length of Stay Meetings, dates discussed:   01/13/2014    Comments:

## 2014-01-12 NOTE — Progress Notes (Signed)
PT Cancellation Note  Patient Details Name: Andrew [name] MRN: [id number] DOB: 12-Feb-1951   Cancelled Treatment:    Reason Eval/Treat Not Completed: Patient at procedure or test/unavailable--pt at hemodialysis. Will attempt to see later today or 8/25.   Donnielle Addison 01/12/2014, 2:36 PM Pager [phone]

## 2014-01-12 NOTE — Progress Notes (Signed)
Pt set-up and placed on BiPAP (Auto mode Min EPAP 8.0, Max IPAP 18.0), with 4 lpm Bleed-in O2 via nasal mask.  Pt tolerating well and is currently stable (HR 73, RR 16, BBS clr/dimin, Sats 97%).  RT to monitor as needed.

## 2014-01-12 NOTE — Consult Note (Signed)
Attempted to perform fistulalysis today however (per Nephrology) pt requires dialysis. As such, potential fistulalysis vs. Tunneled HD catheter will be performed tomorrow.

## 2014-01-12 NOTE — Progress Notes (Signed)
Patient ID: ZVI DUPLANTIS, male   DOB: 1951-01-31, 63 y.o.   MRN: [id number]  [facility] Progress Note   Assessment/ Plan:   1. Complete heart block- s/p urgent transvenous pacer placement that was discontinued after correction of bradycardia. 2-D echocardiogram reassuring with preserved EF. Further management per cardiology. 2. SIRS- unclear etiology,off pressors but BP soft. per PCCM 3. ESRD - order for hemodialysis today (usually M./W./F schedule). No acute needs noted from lab/physical exam and can wait until after thrombectomy done  4. Hypertension/volume - as above off pressors. Has h/o hypotension and is normally on midodrine  daily that was resumed 5. Anemia - hold aranesp due to possible allergy 6. Metabolic bone disease - binders restarted and VDRA on hold 7. Nutrition - per PCCM 9.   Vascular access- thrombectomy of left upper arm ARC graft planned for today by interventional radiology. He states that his last declot was done 2 months ago and he has had recurrent "narrowing and need for ballooning" since graft placed about a year ago  Subjective:   Reports to be feeling well-uneventful overnight. Used CPAP    Objective:   BP 103/41  Pulse 58  Temp(Src) 98.3 F (36.8 C) (Oral)  Resp 12  Ht  (1.626 m)  Wt 115 kg (253 lb 8.5 oz)  BMI 43.50 kg/m2  SpO2 93%  Physical Exam: Gen: Comfortably resting in bed, oxygen via nasal cannula CVS: Pulse regular in rate and rhythm (64), S1 and S2 normal Resp: Clear to auscultation bilaterally, no rales/rhonchi Abd: Soft, obese, nontender and bowel sounds normal Ext: Trace lower extremity edema. Left upper arm arc graft without thrill/bruit  Labs: BMET  Recent Labs Lab 01/08/14 1500 01/09/14 0400 01/09/14 1500 01/10/14 0404 01/10/14 1558 01/11/14 0410 01/12/14 0500  NA 136* 134* 137 135* 138 138 139  K 6.3* 4.2 3.9 3.4* 3.9 3.7 4.1  CL 99 95* 97 96 97 99 100  CO2 GLUCOSE 125*  118* 109* 114* 102* 98 87  BUN 48* 42* 35* 35* 33* 26* 43*  CREATININE 7.08* 5.13* 4.02* 3.16* 3.03* 2.86* 5.21*  CALCIUM 9.3 9.4 9.3 9.5 9.4 9.5 9.3  PHOS 3.5 3.8 3.3 3.0 3.3 3.0 4.6   CBC  Recent Labs Lab 01/07/14 1241  01/07/14 1243 01/08/14 0415 01/09/14 0400 01/10/14 0404 01/11/14 0410  WBC  --   < > 9.5 5.8 5.0 4.5 5.5  NEUTROABS  --   --  5.2  --   --   --   --   HGB 10.5*  --  9.5* 9.7* 8.5* 8.2* 8.4*  HCT 31.0*  --  29.1* 28.5* 24.7* 24.2* 24.7*  MCV  --   < > 93.0 91.3 90.1 89.6 90.8  PLT  --   < > 242 175 158 153 162  < > = values in this interval not displayed.  Medications:    . alteplase  4 mg Intracatheter Once  . aspirin EC  81 mg Oral Daily  . atorvastatin  10 mg Oral q1800  . calcium acetate  2,001 mg Oral TID WC  . DOPamine  2-20 mcg/kg/min Intravenous Once  . heparin subcutaneous  5,000 Units Subcutaneous 3 times per day  . [START ON 01/13/2014] midodrine  15 mg Oral 3 times per day on Sun Tue Thu Sat  . midodrine  5 mg Oral 2 times per day on Mon Wed Fri  . oxybutynin  5 mg Oral  BID  . pantoprazole  40 mg Oral Daily  . senna-docusate  1 tablet Oral BID   Zetta Bills, MD 01/12/2014, 8:27 AM

## 2014-01-12 NOTE — Progress Notes (Signed)
eLink Physician-Brief Progress Note Patient Name: Andrew [name] DOB: 1951/03/29 MRN: [id number]   Date of Service  01/12/2014  HPI/Events of Note  Pauses and desats, likley OSA  eICU Interventions  Add cpap     Intervention Category Intermediate Interventions: Communication with other healthcare providers and/or family;Respiratory distress - evaluation and management  [name] J. 01/12/2014, 2:12 AM

## 2014-01-12 NOTE — Procedures (Signed)
Patient seen on Hemodialysis. QB 200, UF goal 3.1L Treatment adjusted as needed.  Zetta Bills MD [facility]. Office # [phone] Pager # [phone] 1:24 PM

## 2014-01-12 NOTE — Progress Notes (Signed)
Admission note:  Patient cam from Hemodialysis on a bed, originally from [facility].    Mental Orientation: A&O x 4 Telemetry: He is on Telemetry [phone], CCMD notified. Assessment: See doc flowsheets Skin: Warm, dry and intact but dry. IV: No peripheral IV, has a Right IJ.  Pain: He does not c/o pain. Fall Prevention Safety Plan: Educated about the fall prevention safety plan, he understood and acknowledged.   6700 Orientation: Patient has been oriented to the unit, staff and to the room.

## 2014-01-12 NOTE — ED Provider Notes (Signed)
History/physical exam/procedure(s) were performed by non-physician practitioner and as supervising physician I was immediately available for consultation/collaboration. I have reviewed all notes and am in agreement with care and plan.   Genny Caulder S Taraya Steward, MD 01/12/14 2327 

## 2014-01-12 NOTE — Consult Note (Signed)
Reason for Consult:Thrombosed (L)_UE AVF Consulting Radiologist: Pascal Lux Referring Physician: Marval Regal   HPI: Andrew [name] is an 63 y.o. male with hx of ESRD. He had a (L)UE AVF placed a couple years ago and  Eventually has been able to use it. He has been admitted for SIRS and his AVF was found to be clotted. His condition is improved, the pt has been receiving HD via Trialysis catheter. IR is asked to declot AVF PMHx, chart, meds, labs reviewed. Pt states has had declot procedure in the past with previous balloon angioplasty of stenosis as well.  Past Medical History:  Past Medical History  Diagnosis Date  . End stage renal disease   . Coronary artery disease   . Obesity, morbid   . GERD (gastroesophageal reflux disease)   . Cerebral palsy   . Sarcoidosis   . Hyperlipidemia   . Hypertension secondary to other renal disorders   . Secondary hyperparathyroidism (of renal origin)   . Anemia in chronic kidney disease(285.21)   . 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 No pertinent past surgical history.  Family History: History reviewed. No pertinent family history.  Social History:  reports that he has quit smoking. He does not have any smokeless tobacco history on file. His alcohol and drug histories are not on file.  Allergies:  Allergies  Allergen Reactions  . Codeine Other (See Comments)    Per MAR    Medications: Current facility-administered medications:0.9 %  sodium chloride infusion, , Intravenous, Continuous PRN, Shaune Pollack, MD;  0.9 %  sodium chloride infusion, , Intravenous, Continuous, Chesley Mires, MD;  acetaminophen (TYLENOL) tablet 650 mg, 650 mg, Oral, Q4H PRN, Raylene Miyamoto, MD, 650 mg at 01/12/14 0302;  albuterol (PROVENTIL) (2.5 MG/3ML) 0.083% nebulizer solution 2.5 mg, 2.5 mg, Nebulization, Q2H PRN, Chesley Mires, MD alteplase (ACTIVASE) injection 4 mg, 4 mg, Intracatheter, Once, Sandi Mariscal, MD;   artificial tears (LACRILUBE) ophthalmic ointment 1 application, 1 application, Both Eyes, Q4H PRN, Collene Gobble, MD;  aspirin EC tablet 81 mg, 81 mg, Oral, Daily, Collene Gobble, MD;  atorvastatin (LIPITOR) tablet 10 mg, 10 mg, Oral, q1800, Collene Gobble, MD;  calcium acetate (PHOSLO) capsule 2,001 mg, 2,001 mg, Oral, TID WC, Collene Gobble, MD DOPamine (INTROPIN) 800 mg in dextrose 5 % 250 mL (3.2 mg/mL) infusion, 2-20 mcg/kg/min, Intravenous, Once, Sherian Maroon, MD, 1 mcg/kg/min at 01/09/14 2330;  heparin injection 5,000 Units, 5,000 Units, Subcutaneous, 3 times per day, Chesley Mires, MD, 5,000 Units at 01/12/14 0518;  [START ON 01/13/2014] midodrine (PROAMATINE) tablet 15 mg, 15 mg, Oral, 3 times per day on Sun Tue Thu Sat, Wesam G Yacoub, MD midodrine (PROAMATINE) tablet 5 mg, 5 mg, Oral, 2 times per day on Mon Wed Fri, Wesam G Yacoub, MD, 5 mg at 01/12/14 9675;  oxybutynin (DITROPAN-XL) 24 hr tablet 5 mg, 5 mg, Oral, BID, Collene Gobble, MD;  pantoprazole (PROTONIX) EC tablet 40 mg, 40 mg, Oral, Daily, Collene Gobble, MD;  polyethylene glycol (MIRALAX / GLYCOLAX) packet 17 g, 17 g, Oral, Daily PRN, Collene Gobble, MD prismasol BGK 0/2.5 5,000 mL dialysis replacement fluid, , CRRT, Continuous, Donetta Potts, MD, Last Rate: 300 mL/hr at 01/11/14 0853;  prismasol BGK 0/2.5 5,000 mL dialysis replacement fluid, , CRRT, Continuous, Donetta Potts, MD, Last Rate: 300 mL/hr at 01/11/14 0850;  senna-docusate (Senokot-S) tablet 1 tablet, 1 tablet, Oral, BID, Collene Gobble, MD  ROS: See HPI for pertinent findings, otherwise complete 10 system review negative.  Physical Exam: Blood pressure 103/41, pulse 58, temperature 98.3 F (36.8 C), temperature source Oral, resp. rate 12, height _0  (1.626 m), weight 253 lb 8.5 oz (115 kg), SpO2 93.00%. ENT: unremarkable airway Neck: (L)IJ temp HD cath in place Lungs: CTA without w/r/r Heart: Regular Ext: (L)UE with surgical scar c/w AVF, poss basilic vein  transposition. No thrill or bruit. Palpable brachial art pulse    Labs: CBC  Recent Labs  01/10/14 0404 01/11/14 0410  WBC 4.5 5.5  HGB 8.2* 8.4*  HCT 24.2* 24.7*  PLT 153 162   MET  Recent Labs  01/11/14 0410 01/12/14 0500  NA 138 139  K 3.7 4.1  CL 99 100  CO2 27 26  GLUCOSE 98 87  BUN 26* 43*  CREATININE 2.86* 5.21*  CALCIUM 9.5 9.3    Recent Labs  01/12/14 0500  ALBUMIN 2.8*   PT/INR No results found for this basename: LABPROT, INR,  in the last 72 hours ABG  Recent Labs  01/10/14 0405  PHART 7.468*  HCO3 24.9*      No results found.  Assessment/Plan: Clotted left AVF Discussed thrombolysis/thrombectomy of AV Graft/Fistula, possible angioplasty, possible stent, possible HD catheter placement if necessary. Risks, benefits, use of sedation thoroughly explained.  Ascencion Dike PA-C 01/12/2014, 9:50 AM

## 2014-01-12 NOTE — Progress Notes (Signed)
HD called and stated that Dr Allena Katz wants pt dialyzed today. RN spoke with IR and informed them that per Dr Allena Katz that pt needs HD done today, this afternoon. Therefore, IR procedure will not be completed until tomorrow 8/25.

## 2014-01-12 NOTE — Progress Notes (Signed)
PULMONARY / CRITICAL CARE MEDICINE   Name: Andrew [name] MRN: [id number] DOB: [birth year]    ADMISSION DATE:  01/07/2014  REFERRING MD :  EDP  CHIEF COMPLAINT:  Bradycardia, SOB  INITIAL PRESENTATION: 63 yo male with hx ESRD, CAD admitted 8/19 with severe symptomatic bradycardia and dyspnea.   STUDIES:  2D echo 8/19>>> EF 55-60%, B atria moderately dilated,   SIGNIFICANT EVENTS: 8/19 admitted, complete heart block, intubated 8/19 brady cardiac arrest s/p temp transvenous pacer placement 8/19 CVVHD 8/21 pacer removed 8/22 extubated 8/24 went for declot but needed HD first   SUBJECTIVE:  Feels well today, went for declot but needed HD first so procedure not performed   VITAL SIGNS: Temp:  [98 F (36.7 C)-98.3 F (36.8 C)] 98.3 F (36.8 C) (08/24 0824) Pulse Rate:  [53-89] 58 (08/24 0824) Resp:  [9-25] 12 (08/24 0824) BP: (93-110)/(39-49) 103/41 mmHg (08/24 0824) SpO2:  [90 %-100 %] 93 % (08/24 0824) Weight:  [115 kg (253 lb 8.5 oz)] 115 kg (253 lb 8.5 oz) (08/24 0500) HEMODYNAMICS:   VENTILATOR SETTINGS:    INTAKE / OUTPUT:  Intake/Output Summary (Last 24 hours) at 01/12/14 1200 Last data filed at 01/12/14 0900  Gross per 24 hour  Intake    420 ml  Output      0 ml  Net    420 ml   PHYSICAL EXAMINATION: General:  Good spirits HEENT: NCAT,EOMi PULM: CTA B CV: Irreg irreg, no mgr Ab: BS+, soft, nontender Ext: trace edema Neuro: A&Ox4  LABS:  CBC  Recent Labs Lab 01/09/14 0400 01/10/14 0404 01/11/14 0410  WBC 5.0 4.5 5.5  HGB 8.5* 8.2* 8.4*  HCT 24.7* 24.2* 24.7*  PLT 158 153 162   Coag's  Recent Labs Lab 01/07/14 1243  01/09/14 0400 01/10/14 0404 01/11/14 0410  APTT  --   < > 84* 72* 64*  INR 1.17  --   --   --   --   < > = values in this interval not displayed. BMET  Recent Labs Lab 01/10/14 1558 01/11/14 0410 01/12/14 0500  NA 138 138 139  K 3.9 3.7 4.1  CL 97 99 100  CO2 BUN 33* 26* 43*  CREATININE 3.03*  2.86* 5.21*  GLUCOSE 102* 98 87   Electrolytes  Recent Labs Lab 01/09/14 0400  01/10/14 0404 01/10/14 1558 01/11/14 0410 01/12/14 0500  CALCIUM 9.4  < > 9.5 9.4 9.5 9.3  MG 2.2  --  2.3  --  2.4  --   PHOS 3.8  < > 3.0 3.3 3.0 4.6  < > = values in this interval not displayed. Sepsis Markers No results found for this basename: LATICACIDVEN, PROCALCITON, O2SATVEN,  in the last 168 hours  ABG  Recent Labs Lab 01/07/14 1721 01/09/14 0410 01/10/14 0405  PHART 7.267* 7.430 7.468*  PCO2ART 39.0 40.4 34.8*  PO2ART 93.0 76.1* 83.1   Liver Enzymes  Recent Labs Lab 01/07/14 1243  01/10/14 1558 01/11/14 0410 01/12/14 0500  AST 16  --   --   --   --   ALT 12  --   --   --   --   ALKPHOS 226*  --   --   --   --   BILITOT 0.4  --   --   --   --   ALBUMIN 3.9  < > 3.2* 2.9* 2.8*  < > = values in this interval not displayed. Cardiac Enzymes  Recent  Labs Lab 01/07/14 1445 01/07/14 2045 01/08/14 0245  TROPONINI <0.30 0.33* 0.46*   Glucose  Recent Labs Lab 01/10/14 2330 01/11/14 0354 01/11/14 0819 01/11/14 1216 01/11/14 1613 01/12/14 0824  GLUCAP 88 94 84 94 92 93   Imaging No results found. ASSESSMENT / PLAN:  PULMONARY OETT 8/19>>> 8/22 Acute respiratory failure - resolved P:   - Volume even via HD. - PRN BD. - follow CXR - mobilize   CARDIOVASCULAR R fem cortis 8/19>>>out Severe symptomatic bradycardia - presumed r/t hyperkalemia  Hx CAD  P:  - Temp pacer out 8/21 - HD per renal - see below. - Will need coumadin soon, maintain on heparin for a-fib for now until graft declotted. - continue home midodrine  RENAL ESRD > clotted graft Hyperkalemia > on admission felt to be due to inefficient dialysis from clotted graft Metabolic acidosis - improved P:   - plan HD   - declot graft vs new perm cath 8/25 - follow BMP  GASTROINTESTINAL GERD P:   - PPI. - Renal diet  HEMATOLOGIC Anemia - mild  P:  - F/u cbc. - heparin  gtt  INFECTIOUS SIRS but no clear source of infection  P:   - Monitor fever, wbc curve off abx.  ENDOCRINE Hyperglycemia - mild, no known hx DM P:   - Monitor.  NEUROLOGIC AMS - in setting symptomatic bradycardia, improved P:   - RASS goal: 0.  TODAY'S SUMMARY: HD today. Will need graft declot 8/25. Transfer to [facility], Tele, PCCM off  Heber [place], MD [place] PCCM Pager: [phone] Cell: [phone] If no response, call [phone]

## 2014-01-13 ENCOUNTER — Inpatient Hospital Stay (HOSPITAL_COMMUNITY): Payer: Medicare Other

## 2014-01-13 LAB — RENAL FUNCTION PANEL
Albumin: 3.3 g/dL — ABNORMAL LOW (ref 3.5–5.2)
Anion gap: 16 — ABNORMAL HIGH (ref 5–15)
BUN: 42 mg/dL — ABNORMAL HIGH (ref 6–23)
CO2: 24 meq/L (ref 19–32)
Calcium: 9.7 mg/dL (ref 8.4–10.5)
Chloride: 94 meq/L — ABNORMAL LOW (ref 96–112)
Creatinine, Ser: 5.79 mg/dL — ABNORMAL HIGH (ref 0.50–1.35)
GFR calc Af Amer: 11 mL/min — ABNORMAL LOW (ref >90)
GFR calc non Af Amer: 9 mL/min — ABNORMAL LOW (ref >90)
Glucose, Bld: 105 mg/dL — ABNORMAL HIGH (ref 70–99)
Phosphorus: 4.3 mg/dL (ref 2.3–4.6)
Potassium: 4.3 meq/L (ref 3.7–5.3)
Sodium: 134 meq/L — ABNORMAL LOW (ref 137–147)

## 2014-01-13 LAB — HEPATITIS B SURFACE ANTIGEN: Hepatitis B Surface Ag: NEGATIVE

## 2014-01-13 MED ORDER — MIDAZOLAM HCL 2 MG/2ML IJ SOLN
INTRAMUSCULAR | Status: AC | PRN
  Administered 2014-01-13 (×2): 1 mg via INTRAVENOUS
  Administered 2014-01-13: 2 mg via INTRAVENOUS

## 2014-01-13 MED ORDER — IOHEXOL 300 MG/ML  SOLN
100.0000 mL | Freq: Once | INTRAMUSCULAR | Status: AC | PRN
  Administered 2014-01-13: 75 mL via INTRAVENOUS

## 2014-01-13 MED ORDER — LIDOCAINE-EPINEPHRINE (PF) 1 %-1:200000 IJ SOLN
INTRAMUSCULAR | Status: AC
  Filled 2014-01-13: qty 10

## 2014-01-13 MED ORDER — FENTANYL CITRATE 0.05 MG/ML IJ SOLN
INTRAMUSCULAR | Status: AC
  Filled 2014-01-13: qty 6

## 2014-01-13 MED ORDER — HEPARIN SODIUM (PORCINE) 1000 UNIT/ML DIALYSIS
4000.0000 [IU] | INTRAMUSCULAR | Status: DC | PRN
  Filled 2014-01-13: qty 4

## 2014-01-13 MED ORDER — FENTANYL CITRATE 0.05 MG/ML IJ SOLN
INTRAMUSCULAR | Status: AC | PRN
  Administered 2014-01-13 (×4): 50 ug via INTRAVENOUS

## 2014-01-13 MED ORDER — CEFAZOLIN SODIUM-DEXTROSE 2-3 GM-% IV SOLR
INTRAVENOUS | Status: AC
  Filled 2014-01-13: qty 50

## 2014-01-13 MED ORDER — HEPARIN SODIUM (PORCINE) 1000 UNIT/ML IJ SOLN
INTRAMUSCULAR | Status: AC
  Filled 2014-01-13: qty 1

## 2014-01-13 MED ORDER — ALTEPLASE 100 MG IV SOLR
4.0000 mg | INTRAVENOUS | Status: AC
  Administered 2014-01-13: 4 mg
  Filled 2014-01-13: qty 4

## 2014-01-13 MED ORDER — POLYETHYLENE GLYCOL 3350 17 G PO PACK
17.0000 g | PACK | Freq: Every day | ORAL | Status: DC
  Administered 2014-01-13 – 2014-01-14 (×2): 17 g via ORAL
  Filled 2014-01-13 (×2): qty 1

## 2014-01-13 MED ORDER — LIDOCAINE HCL 1 % IJ SOLN
INTRAMUSCULAR | Status: AC
  Filled 2014-01-13: qty 20

## 2014-01-13 MED ORDER — NEPRO/CARBSTEADY PO LIQD
237.0000 mL | Freq: Two times a day (BID) | ORAL | Status: DC
  Administered 2014-01-14: 237 mL via ORAL

## 2014-01-13 MED ORDER — CEFAZOLIN SODIUM 1-5 GM-% IV SOLN
1.0000 g | Freq: Once | INTRAVENOUS | Status: AC
  Administered 2014-01-13: 1 g via INTRAVENOUS

## 2014-01-13 MED ORDER — MIDAZOLAM HCL 2 MG/2ML IJ SOLN
INTRAMUSCULAR | Status: AC
  Filled 2014-01-13: qty 6

## 2014-01-13 NOTE — Progress Notes (Signed)
Requested TPA from pharmacy for upcoming procedure in IR

## 2014-01-13 NOTE — Progress Notes (Signed)
Jhana Giarratano Barnett RD, LDN Inpatient Clinical Dietitian Pager: [phone] After Hours Pager: [phone]  

## 2014-01-13 NOTE — Sedation Documentation (Signed)
Successful HD cath olaced

## 2014-01-13 NOTE — Procedures (Signed)
Failed attempted lysis of native left upper arm AVF. Successful removal of temporary R IJ approach HD catheter and placement of tunneled R IJ approach HD catheter. The HD catheter is ready for immediate use.

## 2014-01-13 NOTE — Evaluation (Signed)
Physical Therapy Evaluation Patient Details Name: Andrew [name] MRN: [id number] DOB: 1950/10/21 Today's Date: 01/13/2014   History of Present Illness  Andrew [name] is an 63 y.o. male with hx of ESRD. He had a (L)UE AVF placed a couple years ago and  Eventually has been able to use it. He has been admitted for SIRS and his AVF was found to be clotted. His condition is improved, the pt has been receiving HD via Trialysis catheter. IR is asked to declot AVF  Clinical Impression  Pt presents with generalized weakness and decreased activity tolerance with increased reliance on supplemental O2. Pt tolerated sitting on EOB (dizzy initially) then performing standing x 45 secs.  Note pt with very weak LEs and had difficulty achieving full stance.  Feel pt will benefit from skilled acute services to address deficits.  PT recommends returning to [place], however in portion that pt will receive therapy to return to PLOF.     Follow Up Recommendations SNF;Supervision/Assistance - 24 hour (will need to rehab to return to PLOF)    Equipment Recommendations  None recommended by PT    Recommendations for Other Services       Precautions / Restrictions Precautions Precautions: Fall Precaution Comments: monitor O2 during activity, pt very weak Restrictions Weight Bearing Restrictions: No      Mobility  Bed Mobility Overal bed mobility: Needs Assistance Bed Mobility: Supine to Sit;Sit to Supine     Supine to sit: Supervision Sit to supine: Min assist   General bed mobility comments: Pt able to get himself to EOB with HOB elevated and with use of bed rails.  Note that he uses hospital bed at [facility], therefore allowed him to use during this session.   Transfers Overall transfer level: Needs assistance Equipment used: Rolling walker (2 wheeled) Transfers: Sit to/from Stand Sit to Stand: Mod assist         General transfer comment: Pt requires assist for forward weight shift and  facilitation for increased glutes once in standing for more upright stance.  Pt with heavily reliance on UEs, despite cues for decreased WB on LUE.  Also provided assist at R knee to prevent buckle.   Ambulation/Gait Ambulation/Gait assistance:  (did not have +2 and pt leaving unit)              Stairs            Wheelchair Mobility    Modified Rankin (Stroke Patients Only)       Balance Overall balance assessment: Needs assistance Sitting-balance support: Bilateral upper extremity supported Sitting balance-Leahy Scale: Fair     Standing balance support: Bilateral upper extremity supported;During functional activity Standing balance-Leahy Scale: Poor Standing balance comment: Pt requires mod A to maintain balance with use of RW                             Pertinent Vitals/Pain Pain Assessment: 0-10 Pain Score: 3  Pain Location: L side of neck where lines are Pain Descriptors / Indicators: Aching Pain Intervention(s): Monitored during session    Home Living Family/patient expects to be discharged to:: Skilled nursing facility Living Arrangements: Other (Comment) (resides at [facility]               Additional Comments: Pt states that he was not getting therapy at [facility], was resident.      Prior Function Level of Independence: Independent with assistive device(s)  Comments: Pt transfers to w/c most of the time and self propels w/c around unit.  States that he can walk with a RW, however tends to get out of breath and can't walk the full length of the hallway     Hand Dominance        Extremity/Trunk Assessment               Lower Extremity Assessment: Generalized weakness (RLE weaker than LLE)      Cervical / Trunk Assessment: Kyphotic  Communication   Communication: No difficulties  Cognition Arousal/Alertness: Awake/alert Behavior During Therapy: WFL for tasks assessed/performed Overall Cognitive  Status: Within Functional Limits for tasks assessed                      General Comments      Exercises        Assessment/Plan    PT Assessment Patient needs continued PT services  PT Diagnosis Difficulty walking;Generalized weakness;Acute pain   PT Problem List Decreased strength;Decreased activity tolerance;Decreased balance;Decreased mobility;Decreased knowledge of use of DME;Cardiopulmonary status limiting activity;Obesity  PT Treatment Interventions DME instruction;Gait training;Functional mobility training;Therapeutic activities;Therapeutic exercise;Balance training;Patient/family education;Wheelchair mobility training   PT Goals (Current goals can be found in the Care Plan section) Acute Rehab PT Goals Patient Stated Goal: to return to [facility] for therapy PT Goal Formulation: With patient Time For Goal Achievement: 01/20/14 Potential to Achieve Goals: Good    Frequency Min 3X/week   Barriers to discharge        Co-evaluation               End of Session Equipment Utilized During Treatment: Gait belt;Oxygen Activity Tolerance: Patient limited by fatigue Patient left: in bed (leaving unit) Nurse Communication: Mobility status         Time: 1610-9604 PT Time Calculation (min): 35 min   Charges:   PT Evaluation $Initial PT Evaluation Tier I: 1 Procedure PT Treatments $Therapeutic Activity: 23-37 mins   PT G Codes:          Vista Deck 01/13/2014, 11:09 AM

## 2014-01-13 NOTE — Progress Notes (Signed)
NUTRITION FOLLOW-UP  DOCUMENTATION CODES Per approved criteria  -Morbid Obesity   INTERVENTION: Provide Nepro Shake po BID between meals, each supplement provides 425 kcal and 19 grams protein.  NUTRITION DIAGNOSIS: Inadequate oral intake related to inability to eat as evidenced by NPO status; Resolved  NEW NUTRITION Dx: Increased nutrient needs related to chronic illness, ESRD as evidenced by estimated nutrition needs.  Goal: Pt to meet >/= 90% of their estimated nutrition needs   Monitor:  PO intake, weight, labs, I/O's  63 y.o. male  Admitting Dx: bradycardia, SOB  ASSESSMENT: 63yo male with hx ESRD, CAD presented with chest pain. Presented for his usual HD but his graft was clotted. He began c/o chest pain and SOB and was found to be significantly bradycardic, hyperkalemic and volume overloaded. Was sent to ER as STEMI initially which was cancelled. He had symptomatic bradycardia with syncope x2 and required external pacing. Became increasingly dyspneic and lost capture from external pacer.  Patient s/p procedure 8/19: ULTRASOUND GUIDED PLACEMENT OF TEMPORARY TRANSVENOUS PACEMAKER   8/20-Patient  intubated on ventilator support -- OGT in place -Patient currently on CVVHD due to hypotension/pressor-requirement. -No nutrition problems identified PTA.  No muscle or subcutaneous fat depletion noticed.  8/25- Pt extubated 8/22. Spoke with pt, he reports having a good appetite with no problems. Meal completion is 25-80%. Pt reports he has no eaten today because he will be going to a procedure later in the afternoon. Pt reports he would like to have Nepro ordered for intake of extra calories and protein. Will order. Pt was encouraged to eat his food at meals and to drink his supplements.  Labs: Low sodium, chloride, and GFR. High glucose ( /dL), BUN, and creatinine  Height: Ht Readings from Last 1 Encounters:  01/07/14  (1.626 m)    Weight: Wt Readings from Last 1  Encounters:  01/13/14 255 lb 4.7 oz (115.8 kg)   BMI:  Body mass index is 43.8 kg/(m^2). Morbid obesity  Adjusted body weight:104.6 kg  Re-Estimated Nutritional Needs:  Kcal: 2150-2350 Protein: 100-110 grams Fluid: 1.2 L/day  Skin: +1 generalized edema  Diet Order: Diabetic/renal with 1200 ml fluids   Intake/Output Summary (Last 24 hours) at 01/13/14 1104 Last data filed at 01/13/14 0900  Gross per 24 hour  Intake    240 ml  Output   3000 ml  Net  -2760 ml   Last BM: 8/23  Labs:   Recent Labs Lab 01/09/14 0400  01/10/14 0404  01/11/14 0410 01/12/14 0500 01/13/14 0648  NA 134*  < > 135*  < > 138 139 134*  K 4.2  < > 3.4*  < > 3.7 4.1 4.3  CL 95*  < > 96  < > 99 100 94*  CO2 24  < > 24  < > BUN 42*  < > 35*  < > 26* 43* 42*  CREATININE 5.13*  < > 3.16*  < > 2.86* 5.21* 5.79*  CALCIUM 9.4  < > 9.5  < > 9.5 9.3 9.7  MG 2.2  --  2.3  --  2.4  --   --   PHOS 3.8  < > 3.0  < > 3.0 4.6 4.3  GLUCOSE 118*  < > 114*  < > 98 87 105*  < > = values in this interval not displayed.  CBG (last 3)   Recent Labs  01/11/14 1216 01/11/14 1613 01/12/14 0824  GLUCAP 94 92 93  Scheduled Meds: . alteplase  4 mg Intracatheter Once  . alteplase  4 mg Intracatheter to XRAY  . aspirin EC  81 mg Oral Daily  . atorvastatin  10 mg Oral q1800  . calcium acetate  2,001 mg Oral TID WC  . diphenhydrAMINE  12.5 mg Intravenous Once  . DOPamine  2-20 mcg/kg/min Intravenous Once  . heparin subcutaneous  5,000 Units Subcutaneous 3 times per day  . midodrine  15 mg Oral 3 times per day on Sun Tue Thu Sat  . midodrine  5 mg Oral 2 times per day on Mon Wed Fri  . oxybutynin  5 mg Oral BID  . pantoprazole  40 mg Oral Daily  . senna-docusate  1 tablet Oral BID    Continuous Infusions: . sodium chloride Stopped (01/09/14 1600)  . sodium chloride Stopped (01/11/14 1100)  . dialysis replacement fluid (prismasate) 300 mL/hr at 01/11/14 0853  . dialysis replacement fluid  (prismasate) 300 mL/hr at 01/11/14 1610    Past Medical History  Diagnosis Date  . End stage renal disease   . Coronary artery disease   . Obesity, morbid   . GERD (gastroesophageal reflux disease)   . Cerebral palsy   . Sarcoidosis   . Hyperlipidemia   . Hypertension secondary to other renal disorders   . Secondary hyperparathyroidism (of renal origin)   . Anemia in chronic kidney disease(285.21)   . 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 No pertinent past surgical history.  Marijean Niemann, MS, Provisional LDN Pager # [phone] After hours/ weekend pager # [phone]

## 2014-01-13 NOTE — Procedures (Signed)
L fistula declot (attempted): could not achieve antegrade flow, venous rupture post 6mm PTA. Will place tunneled HD cath. See complete dictation in [facility].

## 2014-01-13 NOTE — Sedation Documentation (Signed)
Unable to get fistula open- will change procedure to HD cath placement

## 2014-01-13 NOTE — Progress Notes (Addendum)
Subjective:  Complaints of being tired and fatigued but otherwise no specific complaints. States it is mildly dyspneic. Denies fever or chills.  Objective:  Vital Signs in the last 24 hours: Temp:  [98 F (36.7 C)-98.4 F (36.9 C)] 98.2 F (36.8 C) (08/25 1725) Pulse Rate:  [55-70] 68 (08/25 1725) Resp:  [8-22] 18 (08/25 1725) BP: (81-118)/(46-71) 101/60 mmHg (08/25 1725) SpO2:  [90 %-100 %] 96 % (08/25 1725) Weight:  [115.8 kg (255 lb 4.7 oz)-115.9 kg (255 lb 8.2 oz)] 115.8 kg (255 lb 4.7 oz) (08/25 0500)  Intake/Output from previous day: 08/24 0701 - 08/25 0700 In: 240 [P.O.:240] Out: 3000   Physical Exam:   General appearance: alert, cooperative, appears older than stated age, fatigued and morbidly obese Eyes: negative findings: lids and lashes normal Neck: no adenopathy, no carotid bruit and Short neck Neck: JVP - normal, carotids 2+= without bruits Resp: clear to auscultation bilaterally and Decreased breath sounds at the bases without any added sounds Chest wall: no tenderness there is a right subclavian central line. Appears healthy. Cardio: regular rate and rhythm, S1, S2 normal, no murmur, click, rub or gallop and Distant heart sounds. GI: soft, non-tender; bowel sounds normal; no masses,  no organomegaly and Large pannus present. Extremities: extremities normal, atraumatic, no cyanosis or edema    Lab Results: BMP  Recent Labs  01/11/14 0410 01/12/14 0500 01/13/14 0648  NA 138 139 134*  K 3.7 4.1 4.3  CL 99 100 94*  CO2 GLUCOSE 98 87 105*  BUN 26* 43* 42*  CREATININE 2.86* 5.21* 5.79*  CALCIUM 9.5 9.3 9.7  GFRNONAA 22* 11* 9*  GFRAA 26* 12* 11*    CBC  Recent Labs Lab 01/07/14 1243  01/12/14 1258  WBC 9.5  < > 5.8  RBC 3.13*  < > 2.64*  HGB 9.5*  < > 8.2*  HCT 29.1*  < > 23.8*  PLT 242  < > 175  MCV 93.0  < > 90.2  MCH 30.4  < > 31.1  MCHC 32.6  < > 34.5  RDW 14.2  < > 13.8  LYMPHSABS 3.0  --   --   MONOABS 1.0  --   --    EOSABS 0.2  --   --   BASOSABS 0.0  --   --   < > = values in this interval not displayed.  HEMOGLOBIN A1C No results found for this basename: HGBA1C, MPG    Cardiac Panel (last 3 results)  Recent Labs  01/07/14 1445 01/07/14 2045 01/08/14 0245  TROPONINI <0.30 0.33* 0.46*    BNP (last 3 results) No results found for this basename: PROBNP,  in the last 8760 hours  TSH No results found for this basename: TSH,  in the last 8760 hours  CHOLESTEROL No results found for this basename: CHOL,  in the last 8760 hours  Hepatic Function Panel  Recent Labs  01/07/14 1243  01/11/14 0410 01/12/14 0500 01/13/14 0648  PROT 8.1  --   --   --   --   ALBUMIN 3.9  < > 2.9* 2.8* 3.3*  AST 16  --   --   --   --   ALT 12  --   --   --   --   ALKPHOS 226*  --   --   --   --   BILITOT 0.4  --   --   --   --   < > =  values in this interval not displayed.  Imaging: Imaging results have been reviewed  Cardiac Studies:  EKG: Telemetry 01/13/2014: Patient appears to be in sinus rhythm with first degree AV block. Scheduled Meds: . alteplase  4 mg Intracatheter Once  . aspirin EC  81 mg Oral Daily  . atorvastatin  10 mg Oral q1800  . calcium acetate  2,001 mg Oral TID WC  . ceFAZolin      . diphenhydrAMINE  12.5 mg Intravenous Once  . DOPamine  2-20 mcg/kg/min Intravenous Once  . feeding supplement (NEPRO CARB STEADY)  237 mL Oral BID BM  . fentaNYL      . heparin subcutaneous  5,000 Units Subcutaneous 3 times per day  . lidocaine      . lidocaine-EPINEPHrine      . midazolam      . midodrine  15 mg Oral 3 times per day on Sun Tue Thu Sat  . midodrine  5 mg Oral 2 times per day on Mon Wed Fri  . oxybutynin  5 mg Oral BID  . pantoprazole  40 mg Oral Daily  . polyethylene glycol  17 g Oral Daily  . senna-docusate  1 tablet Oral BID   Continuous Infusions: . sodium chloride Stopped (01/11/14 1100)   PRN Meds:.sodium chloride, sodium chloride, acetaminophen, albuterol,  artificial tears, heparin, heparin, lidocaine (PF), lidocaine-prilocaine, pentafluoroprop-tetrafluoroeth   Assessment/Plan:  1. Atypical paroxysmal atrial flutter, now patient appears to be in sinus rhythm. Presented with marked bradycardia and went to a standstill this is taking need for temporary pacemaker implantation, patient was also hyperkalemic and in metabolic acidosis on admission to the hospital. Bradycardia arrhythmia now resolved.  2. End-stage renal disease on hemodialysis 3. Hypertension 4. Morbid obesity with alveolar hypoventilation 5. Hyperlipidemia  Recommendation: I have ordered an EKG today, if indeed he has converted to sinus rhythm, we can certainly continue to watch without long-term anticoagulation. His dysrhythmia may have been related to metabolic issues at presentation. But I also suspect he is at high risk for atrial arrhythmias due to morbid obesity.  If indeed he has persistent atrial fibrillation/atypical atrial flutter, patient will need long-term anticoagulation with Coumadin. Patient sees all his physicians in [place], hence will need to establish with one of the cardiologists in [place], [place].  His anemia is being worked, there were no obvious signs of GI bleed while in the hospital, stool occult blood has been sent. Unless he has frank bleeding, occult blood positive in stool, would recommend anticoagulation unless he has maintained sinus rhythm. Unfortunately there has been no record since I last saw him 4-5 days ago regarding his rhythm issues. I'll follow up on the EKG.  If plans are to place A-V. shunt surgically, then obviously Coumadin can be held off for now.  Pamella Pert, M.D. 01/13/2014, 6:57 PM [facility] Pager: [phone] Office: [phone] If no answer: [phone]   EKG 01/14/2014: Sinus rhythm with first-degree AV block at the rate of 85 bpm, left axis deviation, left anterior fascicular block.  Poor  R-wave progression.  LVH with repolarization abnormality, cannot exclude lateral ischemia.  Pulmonary disease pattern.  No need for anticoagulation for now. Continue ASA. If recurrence of atrial tachycardia, then needs long-term anticoagulation.

## 2014-01-13 NOTE — Progress Notes (Signed)
[facility] PROGRESS NOTE  Andrew [name] ZOX:[id number] DOB: Feb 05, 1951 DOA: 01/07/2014 PCP: No PCP Per Patient  Assessment/Plan: 63yo male with hx ESRD, CAD presented 8/19 with chest pain. Presented for his usual HD but his graft was clotted. He began c/o chest pain and SOB and was found to be significantly bradycardic, hyperkalemic and volume overloaded. Was sent to ER as STEMI initially which was cancelled. He had symptomatic bradycardia with syncope x2 and required external pacing. Became increasingly dyspneic and lost capture from external pacer. Seen by Dr. Nadara Eaton with plans for emergent cath lab for pacer placement.   Acute respiratory failure - resolved follow CXR order for 8-26.  Volume even via HD.  Severe symptomatic bradycardia - presumed r/t hyperkalemia  Patioent required pressors levophed.  Dr Jacinto Halim. place temporary transvenous pacemaker Temp pacer out 8/21  Abdominal pain: Check KUB.   History of A fib:  Will ask Cardiology recommendation for anticoagulation  I spoke with Dr Jacinto Halim, he will see patient .  He recommend start Coumadin. I will hold on starting coumadin until we determine plan for AVF and in case of further procedure.   ESRD > clotted graft declot graft vs new perm cath 8/25. Failed Attempted lysis of left AVF on 8-25.   Anemia: hb stable.  Will check iron, ferritin, B 12, Guaiac stool.   Encephalopathy: Secondary to symptomatic bradycardia, improved.    Code Status: Full Code.  Family Communication: Care discussed with patient.  Disposition Plan: To be determine.    Consultants:  Nephrologist  Vascular.   Procedures// Events.  2D echo 8/19>>> EF 55-60%, B atria moderately dilated,   8/19 admitted, complete heart block, intubated  8/19 brady cardiac arrest s/p temp transvenous pacer placement  8/19 CVVHD  8/21 pacer removed  8/22 extubated  8/24 went for declot but needed HD first     Antibiotics:  none  HPI/Subjective: Patient relates pain left arm.  Patient relates small amount of blood stool 1 week prior to admission when he was trying to have BM.  he is complaining of lower quadrant pain, for months.. No BM since admission.   Objective: Filed Vitals:   01/13/14 0520  BP: 100/59  Pulse: 65  Temp: 98 F (36.7 C)  Resp: 18    Intake/Output Summary (Last 24 hours) at 01/13/14 0909 Last data filed at 01/13/14 0521  Gross per 24 hour  Intake    240 ml  Output   3000 ml  Net  -2760 ml   Filed Weights   01/12/14 1757 01/12/14 2033 01/13/14 0500  Weight: 115.9 kg (255 lb 8.2 oz) 115.9 kg (255 lb 8.2 oz) 115.8 kg (255 lb 4.7 oz)    Exam:   General:  Alert in no distress, morbid obese.   Cardiovascular: S 1, S 2 RRR  Respiratory: CTA  Abdomen: BS present, soft, NT  Musculoskeletal: trace edema.   Data Reviewed: Basic Metabolic Panel:  Recent Labs Lab 01/07/14 1243 01/08/14 0415  01/09/14 0400  01/10/14 0404 01/10/14 1558 01/11/14 0410 01/12/14 0500 01/13/14 0648  NA 138 136*  < > 134*  < > 135* 138 138 139 134*  K 6.8* 5.5*  < > 4.2  < > 3.4* 3.9 3.7 4.1 4.3  CL 95* 96  < > 95*  < > 96 97 99 100 94*  CO2 18* 21  < > 24  < > GLUCOSE 167* 132*  < > 118*  < >  114* 102* 98 87 105*  BUN 106* 75*  < > 42*  < > 35* 33* 26* 43* 42*  CREATININE 15.14* 10.24*  < > 5.13*  < > 3.16* 3.03* 2.86* 5.21* 5.79*  CALCIUM 9.8 9.9  < > 9.4  < > 9.5 9.4 9.5 9.3 9.7  MG  --  2.0  --  2.2  --  2.3  --  2.4  --   --   PHOS  --  3.9  < > 3.8  < > 3.0 3.3 3.0 4.6 4.3  < > = values in this interval not displayed. Liver Function Tests:  Recent Labs Lab 01/07/14 1243  01/10/14 0404 01/10/14 1558 01/11/14 0410 01/12/14 0500 01/13/14 0648  AST 16  --   --   --   --   --   --   ALT 12  --   --   --   --   --   --   ALKPHOS 226*  --   --   --   --   --   --   BILITOT 0.4  --   --   --   --   --   --   PROT 8.1  --   --   --   --   --    --   ALBUMIN 3.9  < > 3.0* 3.2* 2.9* 2.8* 3.3*  < > = values in this interval not displayed. No results found for this basename: LIPASE, AMYLASE,  in the last 168 hours No results found for this basename: AMMONIA,  in the last 168 hours CBC:  Recent Labs Lab 01/07/14 1241  01/07/14 1243 01/08/14 0415 01/09/14 0400 01/10/14 0404 01/11/14 0410 01/12/14 1258  WBC  --   < > 9.5 5.8 5.0 4.5 5.5 5.8  NEUTROABS  --   --  5.2  --   --   --   --   --   HGB 10.5*  --  9.5* 9.7* 8.5* 8.2* 8.4* 8.2*  HCT 31.0*  --  29.1* 28.5* 24.7* 24.2* 24.7* 23.8*  MCV  --   < > 93.0 91.3 90.1 89.6 90.8 90.2  PLT  --   < > 242 175 158 153 162 175  < > = values in this interval not displayed. Cardiac Enzymes:  Recent Labs Lab 01/07/14 1445 01/07/14 2045 01/08/14 0245  TROPONINI <0.30 0.33* 0.46*   BNP (last 3 results) No results found for this basename: PROBNP,  in the last 8760 hours CBG:  Recent Labs Lab 01/11/14 0354 01/11/14 0819 01/11/14 1216 01/11/14 1613 01/12/14 0824  GLUCAP 94 84 94 92 93    Recent Results (from the past 240 hour(s))  MRSA PCR SCREENING     Status: None   Collection Time    01/07/14  3:03 PM      Result Value Ref Range Status   MRSA by PCR NEGATIVE  NEGATIVE Final   Comment:            The GeneXpert MRSA Assay (FDA     approved for NASAL specimens     only), is one component of a     comprehensive MRSA colonization     surveillance program. It is not     intended to diagnose MRSA     infection nor to guide or     monitor treatment for     MRSA infections.     Studies: No results found.  Scheduled  Meds: . alteplase  4 mg Intracatheter Once  . aspirin EC  81 mg Oral Daily  . atorvastatin  10 mg Oral q1800  . calcium acetate  2,001 mg Oral TID WC  . diphenhydrAMINE  12.5 mg Intravenous Once  . DOPamine  2-20 mcg/kg/min Intravenous Once  . heparin subcutaneous  5,000 Units Subcutaneous 3 times per day  . midodrine  15 mg Oral 3 times per day on  Sun Tue Thu Sat  . midodrine  5 mg Oral 2 times per day on Mon Wed Fri  . oxybutynin  5 mg Oral BID  . pantoprazole  40 mg Oral Daily  . senna-docusate  1 tablet Oral BID   Continuous Infusions: . sodium chloride Stopped (01/09/14 1600)  . sodium chloride Stopped (01/11/14 1100)  . dialysis replacement fluid (prismasate) 300 mL/hr at 01/11/14 0853  . dialysis replacement fluid (prismasate) 300 mL/hr at 01/11/14 1610    Active Problems:   Hyperkalemia   Acute respiratory failure with hypoxemia   ESRD (end stage renal disease)   Bradycardia   Cardiogenic shock    Time spent: 35 minutes/.    Cici Rodriges A  [facility] Pager [phone]. If 7PM-7AM, please contact night-coverage at [email or web address], password [facility] 01/13/2014, 9:09 AM  LOS: 6 days

## 2014-01-13 NOTE — Sedation Documentation (Signed)
MD aware of trending BP. MD access only. Fluid bolus administration not available.

## 2014-01-14 ENCOUNTER — Inpatient Hospital Stay (HOSPITAL_COMMUNITY): Payer: Medicare Other

## 2014-01-14 DIAGNOSIS — I4891 Unspecified atrial fibrillation: Secondary | ICD-10-CM | POA: Diagnosis present

## 2014-01-14 DIAGNOSIS — D649 Anemia, unspecified: Secondary | ICD-10-CM

## 2014-01-14 LAB — CBC
HCT: 24.5 % — ABNORMAL LOW (ref 39.0–52.0)
HEMOGLOBIN: 8.5 g/dL — AB (ref 13.0–17.0)
MCH: 30.6 pg (ref 26.0–34.0)
MCHC: 34.7 g/dL (ref 30.0–36.0)
MCV: 88.1 fL (ref 78.0–100.0)
PLATELETS: 200 10*3/uL (ref 150–400)
RBC: 2.78 MIL/uL — AB (ref 4.22–5.81)
RDW: 13.6 % (ref 11.5–15.5)
WBC: 7.7 10*3/uL (ref 4.0–10.5)

## 2014-01-14 LAB — IRON AND TIBC
Iron: 41 ug/dL — ABNORMAL LOW (ref 42–135)
Saturation Ratios: 19 % — ABNORMAL LOW (ref 20–55)
TIBC: 217 ug/dL (ref 215–435)
UIBC: 176 ug/dL (ref 125–400)

## 2014-01-14 LAB — RENAL FUNCTION PANEL
ALBUMIN: 3.2 g/dL — AB (ref 3.5–5.2)
ANION GAP: 20 — AB (ref 5–15)
BUN: 63 mg/dL — AB (ref 6–23)
CALCIUM: 9.3 mg/dL (ref 8.4–10.5)
CO2: 23 mEq/L (ref 19–32)
Chloride: 90 mEq/L — ABNORMAL LOW (ref 96–112)
Creatinine, Ser: 8.44 mg/dL — ABNORMAL HIGH (ref 0.50–1.35)
GFR calc Af Amer: 7 mL/min — ABNORMAL LOW (ref >90)
GFR calc non Af Amer: 6 mL/min — ABNORMAL LOW (ref >90)
Glucose, Bld: 100 mg/dL — ABNORMAL HIGH (ref 70–99)
PHOSPHORUS: 4.5 mg/dL (ref 2.3–4.6)
POTASSIUM: 4.1 meq/L (ref 3.7–5.3)
SODIUM: 133 meq/L — AB (ref 137–147)

## 2014-01-14 LAB — FERRITIN: Ferritin: 1929 ng/mL — ABNORMAL HIGH (ref 22–322)

## 2014-01-14 LAB — VITAMIN B12: Vitamin B-12: 529 pg/mL (ref 211–911)

## 2014-01-14 MED ORDER — DARBEPOETIN ALFA-POLYSORBATE 100 MCG/0.5ML IJ SOLN
100.0000 ug | INTRAMUSCULAR | Status: DC
  Administered 2014-01-14: 100 ug via INTRAVENOUS
  Filled 2014-01-14: qty 0.5

## 2014-01-14 MED ORDER — DIPHENHYDRAMINE HCL 25 MG PO CAPS
25.0000 mg | ORAL_CAPSULE | Freq: Three times a day (TID) | ORAL | Status: DC | PRN
  Administered 2014-01-14: 25 mg via ORAL
  Filled 2014-01-14: qty 1

## 2014-01-14 MED ORDER — DARBEPOETIN ALFA-POLYSORBATE 100 MCG/0.5ML IJ SOLN
INTRAMUSCULAR | Status: AC
  Filled 2014-01-14: qty 0.5

## 2014-01-14 MED ORDER — POLYETHYLENE GLYCOL 3350 17 G PO PACK
17.0000 g | PACK | Freq: Two times a day (BID) | ORAL | Status: DC
  Administered 2014-01-14: 17 g via ORAL
  Filled 2014-01-14 (×3): qty 1

## 2014-01-14 NOTE — Progress Notes (Signed)
[facility] PROGRESS NOTE  JAKAI RISSE FHL:[id number] DOB: 07/02/1950 DOA: 01/07/2014 PCP: No PCP Per Patient  Brief HPI: 63yo male with hx ESRD, CAD presented 8/19 with chest pain. Presented for his usual HD but his graft was clotted. He began c/o chest pain and SOB and was found to be significantly bradycardic, hyperkalemic and volume overloaded. Was sent to ER as STEMI initially which was cancelled. He had symptomatic bradycardia with syncope x2 and required external pacing. Became increasingly dyspneic and lost capture from external pacer. Seen by Dr. Nadyne Coombes with plans for emergent cath lab for pacer placement. This was then removed later.   Assessment/Plan: Acute respiratory failure Resolved. Repeat CXR shows atelectasis.   Severe symptomatic bradycardia Presumed due to hyperkalemia. Patient required pressors levophed. Dr Einar Gip placed temporary transvenous pacemaker. Temp pacer out 8/21. Now better.  Abdominal pain AXR was benign. Symptom resolved.   History of A fib  Currently in SR. No anticoagulation per Dr Einar Gip.   ESRD MWF with clotted graft Failed Attempted lysis of left AVF on 8-25. New catheter placed RIJ.   Anemia Hgb stable.   Encephalopathy Secondary to symptomatic bradycardia. Now improved.   DVT Prophylaxis: Heparin Code Status: Full Code.  Family Communication: Care discussed with patient.  Disposition Plan: To be determine.    Consultants:  Nephrologist  Vascular.   Procedures// Events.  2D echo 8/19>>> EF 55-60%  8/19 admitted, complete heart block, intubated  8/19 brady cardiac arrest s/p temp transvenous pacer placement  8/19 CVVHD  8/21 pacer removed  8/22 extubated  8/24 went for declot but needed HD first   Antibiotics:  none  HPI/Subjective: Patient feels well. No BM since admission. Denies pain.  Objective: Filed Vitals:   01/14/14 1647  BP: 96/49  Pulse: 76  Temp: 97.7 F (36.5 C)  Resp: 18    Intake/Output  Summary (Last 24 hours) at 01/14/14 1701 Last data filed at 01/14/14 1647  Gross per 24 hour  Intake   1077 ml  Output   2918 ml  Net  -1841 ml   Filed Weights   01/13/14 2057 01/14/14 1243 01/14/14 1647  Weight: 113 kg (249 lb 1.9 oz) 114.4 kg (252 lb 3.3 oz) 111.6 kg (246 lb 0.5 oz)    Exam:   General:  Alert in no distress, morbid obese.   Cardiovascular: S 1, S 2 RRR  Respiratory: CTA bilaterally  Abdomen: BS present, soft, NT  Musculoskeletal: trace edema.   Data Reviewed: Basic Metabolic Panel:  Recent Labs Lab 01/08/14 0415  01/09/14 0400  01/10/14 0404 01/10/14 1558 01/11/14 0410 01/12/14 0500 01/13/14 0648 01/14/14 0800  NA 136*  < > 134*  < > 135* 138 138 139 134* 133*  K 5.5*  < > 4.2  < > 3.4* 3.9 3.7 4.1 4.3 4.1  CL 96  < > 95*  < > 96 97 99 100 94* 90*  CO2 21  < > 24  < > 24 25 27 26 24 23   GLUCOSE 132*  < > 118*  < > 114* 102* 98 87 105* 100*  BUN 75*  < > 42*  < > 35* 33* 26* 43* 42* 63*  CREATININE 10.24*  < > 5.13*  < > 3.16* 3.03* 2.86* 5.21* 5.79* 8.44*  CALCIUM 9.9  < > 9.4  < > 9.5 9.4 9.5 9.3 9.7 9.3  MG 2.0  --  2.2  --  2.3  --  2.4  --   --   --  PHOS 3.9  < > 3.8  < > 3.0 3.3 3.0 4.6 4.3 4.5  < > = values in this interval not displayed. Liver Function Tests:  Recent Labs Lab 01/10/14 1558 01/11/14 0410 01/12/14 0500 01/13/14 0648 01/14/14 0800  ALBUMIN 3.2* 2.9* 2.8* 3.3* 3.2*   CBC:  Recent Labs Lab 01/09/14 0400 01/10/14 0404 01/11/14 0410 01/12/14 1258 01/14/14 0800  WBC 5.0 4.5 5.5 5.8 7.7  HGB 8.5* 8.2* 8.4* 8.2* 8.5*  HCT 24.7* 24.2* 24.7* 23.8* 24.5*  MCV 90.1 89.6 90.8 90.2 88.1  PLT 158 153 162 175 200   Cardiac Enzymes:  Recent Labs Lab 01/07/14 2045 01/08/14 0245  TROPONINI 0.33* 0.46*   CBG:  Recent Labs Lab 01/11/14 0354 01/11/14 0819 01/11/14 1216 01/11/14 1613 01/12/14 0824  GLUCAP 94 84 94 92 93    Recent Results (from the past 240 hour(s))  MRSA PCR SCREENING     Status: None    Collection Time    01/07/14  3:03 PM      Result Value Ref Range Status   MRSA by PCR NEGATIVE  NEGATIVE Final   Comment:            The GeneXpert MRSA Assay (FDA     approved for NASAL specimens     only), is one component of a     comprehensive MRSA colonization     surveillance program. It is not     intended to diagnose MRSA     infection nor to guide or     monitor treatment for     MRSA infections.     Studies: Dg Chest 2 View  01/14/2014   CLINICAL DATA:  End-stage renal disease and hypertension  EXAM: CHEST  2 VIEW  COMPARISON:  January 10, 2014  FINDINGS: Dual-lumen catheter present on the right. The more inferior tip of the dual-lumen catheter is at the cavoatrial junction. No pneumothorax.  There is patchy atelectasis in the left lower lobe. There is also mild atelectasis in the right mid lung. There is no frank airspace consolidation. Heart is upper normal in size with pulmonary vascularity within normal limits. No adenopathy. No bone lesions.  IMPRESSION: Central catheter as described without pneumothorax. Patchy atelectasis bilaterally, more on the left than on the right. No frank consolidation.   Electronically Signed   By: Lowella Grip M.D.   On: 01/14/2014 08:06   Dg Abd 1 View  01/13/2014   CLINICAL DATA:  Abdominal pain.  EXAM: ABDOMEN - 1 VIEW  COMPARISON:  None.  FINDINGS: There are no dilated loops of large or small bowel. No visible free air or free fluid on this supine radiograph. No abnormal abdominal calcifications. No acute osseous abnormality. Degenerative changes in the lower lumbar spine. Dialysis catheter tip is seen in the right atrium.  IMPRESSION: Benign appearing abdomen.   Electronically Signed   By: Rozetta Nunnery M.D.   On: 01/13/2014 19:22   Ir Pta Venous Left  01/13/2014   CLINICAL DATA:  Occluded left upper arm hemodialysis fistula. Previous interventions at [facility].  EXAM: DIALYSIS GRAFT DECLOT (attempted)  VENOUS ANGIOPLASTY  ULTRASOUND GUIDANCE  FOR VASCULAR ACCESS X2  FLUOROSCOPY TIME:  12 min 42 seconds  TECHNIQUE: The procedure, risks (including but not limited to bleeding, infection, organ damage ), benefits, and alternatives were explained to the patient. Questions regarding the procedure were encouraged and answered. The patient understands and consents to the procedure.  Intravenous Fentanyl and Versed were administered as  conscious sedation during continuous cardiorespiratory monitoring by the radiology RN, with a total moderate sedation time of 75 minutes.  The outflow vein of the fistula was accessed antegrade just central to the fistula with 21-gauge micropuncture needle under real-time ultrasonic guidance after the overlying skin prepped with Betadine, draped in usual sterile fashion, infiltrated locally with 1%lidocaine. Needle exchanged over 018guidewire for transitional dilator through which 8m t-PA was administered. Ultrasound images were stored. Through the antegrade dilator, a Bentson wire was advanced centrally with the aid of an angled 5 [place] the catheter. This was exchanged for a 6 [place] sheath. Outflow venography was performed. This showed a diminutive central cephalic arch, and patency of the outflow central venous system through the SVC . 3000 units heparin were administered IV. The Kumpe was exchanged over guide wire for the AngioJet device, used to mechanically thrombolyse the thrombosed outflow vein and remove the thrombus from the native outflow. Injection showed partial clearance of the thrombus with no good antegrade flow. No extravasation evident. This was exchanged for a 6 x 4 mm Mustang angioplasty balloon, advanced to the outflow vein. Angioplasty of the outflow vein through its length was performed. Injection showed clearance of thrombus from the graft. Extravasation at the level of the humeral head was suspected. In similar fashion, the central aspect of the outflow vein was accessed retrograde under  ultrasound with a micropuncture needle, exchanged for a transitional dilator. The retrograde dilator was exchanged in similar fashion for a 6 French vascular sheath. The Kumpe catheter was advanced over a glide wire across the arterial anastomosis. Focal outflow arteriogram performed. 5 mm x 2 cm Mustang angioplasty balloon was advanced and used to dilate the fistula and adjacent segment of the outflow vein. There was still no good antegrade flow through the outflow vein. 6 mm PTA of the outflow vein in the at the level of the lower humerus was performed. Shuntogram at this point showed extravasation centrally near the humeral head level. The 6 mm balloon was used to tamponade this segment. Follow-up injection showed return of slow antegrade flow through this segment without continued extravasation or residual/recurrent stenosis. The 5 mm balloon was used to dilate the more central aspect of the cephalic arch. However, followup shuntogram showed minimal antegrade flow through the outflow vein and some recurrence of thrombus in the more peripheral aspect of the outflow vein. This was treated again with a 6 mm balloon but no antegrade flow could be restored. Recurrence of extravasation just below the level of the humeral head was identified, controlled with proximal balloon tamponade of the outflow vein near the fistula. At this point, the procedure was terminated.  IMPRESSION: 1. Unsuccessful percutaneous declot of left upper arm hemodialysis fistula. 2. 5 and 6 mm balloon angioplasty of central outflow vein stenoses, with extravasation near the level of the humeral head and cephalic arch. 3. Due to inability to return adequate flow through the fistula, patient will proceed to tunneled hemodialysis catheter placement to allow continued access.  ACCESS: Will require surgical revision or replacement of the fistula.   Electronically Signed   By: DArne ClevelandM.D.   On: 01/13/2014 16:04   Ir Pta Venous  Left  01/13/2014   CLINICAL DATA:  [facility] upper [facility] hemodialysis fistula[facility]  EXAM: DIALYSIS GRAFT DECLOT (attempted)  VENOUS ANGIOPLASTY  ULTRASOUND GUIDANCE FOR VASCULAR ACCESS X2  FLUOROSCOPY TIME:  12 min 42 seconds  TECHNIQUE: The procedure, risks (including but not limited  to bleeding, infection, organ damage ), benefits, and alternatives were explained to the patient. Questions regarding the procedure were encouraged and answered. The patient understands and consents to the procedure.  Intravenous Fentanyl and Versed were administered as conscious sedation during continuous cardiorespiratory monitoring by the radiology RN, with a total moderate sedation time of 75 minutes.  The outflow vein of the fistula was accessed antegrade just central to the fistula with 21-gauge micropuncture needle under real-time ultrasonic guidance after the overlying skin prepped with Betadine, draped in usual sterile fashion, infiltrated locally with 1%lidocaine. Needle exchanged over 018guidewire for transitional dilator through which 72m t-PA was administered. Ultrasound images were stored. Through the antegrade dilator, a Bentson wire was advanced centrally with the aid of an angled 5 [place] the catheter. This was exchanged for a 6 [place] sheath. Outflow venography was performed. This showed a diminutive central cephalic arch, and patency of the outflow central venous system through the SVC . 3000 units heparin were administered IV. The Kumpe was exchanged over guide wire for the AngioJet device, used to mechanically thrombolyse the thrombosed outflow vein and remove the thrombus from the native outflow. Injection showed partial clearance of the thrombus with no good antegrade flow. No extravasation evident. This was exchanged for a 6 x 4 mm Mustang angioplasty balloon, advanced to the outflow vein. Angioplasty of the outflow vein through its length was performed. Injection  showed clearance of thrombus from the graft. Extravasation at the level of the humeral head was suspected. In similar fashion, the central aspect of the outflow vein was accessed retrograde under ultrasound with a micropuncture needle, exchanged for a transitional dilator. The retrograde dilator was exchanged in similar fashion for a 6 French vascular sheath. The Kumpe catheter was advanced over a glide wire across the arterial anastomosis. Focal outflow arteriogram performed. 5 mm x 2 cm Mustang angioplasty balloon was advanced and used to dilate the fistula and adjacent segment of the outflow vein. There was still no good antegrade flow through the outflow vein. 6 mm PTA of the outflow vein in the at the level of the lower humerus was performed. Shuntogram at this point showed extravasation centrally near the humeral head level. The 6 mm balloon was used to tamponade this segment. Follow-up injection showed return of slow antegrade flow through this segment without continued extravasation or residual/recurrent stenosis. The 5 mm balloon was used to dilate the more central aspect of the cephalic arch. However, followup shuntogram showed minimal antegrade flow through the outflow vein and some recurrence of thrombus in the more peripheral aspect of the outflow vein. This was treated again with a 6 mm balloon but no antegrade flow could be restored. Recurrence of extravasation just below the level of the humeral head was identified, controlled with proximal balloon tamponade of the outflow vein near the fistula. At this point, the procedure was terminated.  IMPRESSION: 1. Unsuccessful percutaneous declot of left upper arm hemodialysis fistula. 2. 5 and 6 mm balloon angioplasty of central outflow vein stenoses, with extravasation near the level of the humeral head and cephalic arch. 3. Due to inability to return adequate flow through the fistula, patient will proceed to tunneled hemodialysis catheter placement to  allow continued access.  ACCESS: Will require surgical revision or replacement of the fistula.   Electronically Signed   By: DArne ClevelandM.D.   On: 01/13/2014 16:04   Ir Fluoro Guide Cv Line Right  01/13/2014   INDICATION: Failed attempted fistulalysis of left  upper arm AV fistula. In need of venous access for continuation of hemodialysis.  EXAM: TUNNELED CENTRAL VENOUS HEMODIALYSIS CATHETER PLACEMENT WITH ULTRASOUND AND FLUOROSCOPIC GUIDANCE  MEDICATIONS: Ancef 2 gm IV; The IV antibiotic was given in an appropriate time interval prior to skin puncture.  CONTRAST:  None  ANESTHESIA/SEDATION: Versed 2 mg IV; Fentanyl 100 mcg IV  Total Moderate Sedation Time  45 minutes.  FLUOROSCOPY TIME:  18 seconds.  COMPLICATIONS: None immediate  PROCEDURE: Informed written consent for placement of a tunneled HD catheter had been obtained prior to initiation of the attempted left upper arm fistula lysis performed by my partner, Dr. Vernard Gambles.  The right neck and chest were prepped with chlorhexidine in a sterile fashion, and a sterile drape was applied covering the operative field. Maximum barrier sterile technique with sterile gowns and gloves were used for the procedure. A timeout was performed prior to the initiation of the procedure.  After creating a small venotomy incision, a micropuncture kit was utilized to access the right internal jugular vein under direct, real-time ultrasound guidance after the overlying soft tissues were anesthetized with 1% lidocaine with epinephrine. Ultrasound image documentation was performed. The microwire was kinked to measure appropriate catheter length. A stiff Glidewire was advanced to the level of the IVC and the micropuncture sheath was exchanged for a peel-away sheath. A hemosplit tunneled hemodialysis catheter measuring 23 cm from tip to cuff was tunneled in a retrograde fashion from the anterior chest wall to the venotomy incision.  The catheter was then placed through the peel-away  sheath with tips ultimately positioned within the superior aspect of the right atrium. Final catheter positioning was confirmed and documented with a spot radiographic image. The catheter aspirates and flushes normally. The catheter was flushed with appropriate volume heparin dwells.  The catheter exit site was secured with a 0-Prolene retention suture. The venotomy incision was closed with an interrupted 4-0 Vicryl, Dermabond and Steri-strips. Dressings were applied. The patient tolerated the procedure well without immediate post procedural complication.  IMPRESSION: Successful placement of 23 cm tip to cuff tunneled hemodialysis catheter via the right internal jugular vein with tips terminating within the superior aspect of the right atrium. The catheter is ready for immediate use.   Electronically Signed   By: Sandi Mariscal M.D.   On: 01/13/2014 14:44   Ir Angio Av Shunt Addl Access  01/13/2014   CLINICAL DATA:  Occluded left upper arm hemodialysis fistula. Previous interventions at [facility].  EXAM: DIALYSIS GRAFT DECLOT (attempted)  VENOUS ANGIOPLASTY  ULTRASOUND GUIDANCE FOR VASCULAR ACCESS X2  FLUOROSCOPY TIME:  12 min 42 seconds  TECHNIQUE: The procedure, risks (including but not limited to bleeding, infection, organ damage ), benefits, and alternatives were explained to the patient. Questions regarding the procedure were encouraged and answered. The patient understands and consents to the procedure.  Intravenous Fentanyl and Versed were administered as conscious sedation during continuous cardiorespiratory monitoring by the radiology RN, with a total moderate sedation time of 75 minutes.  The outflow vein of the fistula was accessed antegrade just central to the fistula with 21-gauge micropuncture needle under real-time ultrasonic guidance after the overlying skin prepped with Betadine, draped in usual sterile fashion, infiltrated locally with 1%lidocaine. Needle exchanged over 018guidewire for transitional  dilator through which 93m t-PA was administered. Ultrasound images were stored. Through the antegrade dilator, a Bentson wire was advanced centrally with the aid of an angled 5 [place] the catheter. This was exchanged for a 6 [place]  sheath. Outflow venography was performed. This showed a diminutive central cephalic arch, and patency of the outflow central venous system through the SVC . 3000 units heparin were administered IV. The Kumpe was exchanged over guide wire for the AngioJet device, used to mechanically thrombolyse the thrombosed outflow vein and remove the thrombus from the native outflow. Injection showed partial clearance of the thrombus with no good antegrade flow. No extravasation evident. This was exchanged for a 6 x 4 mm Mustang angioplasty balloon, advanced to the outflow vein. Angioplasty of the outflow vein through its length was performed. Injection showed clearance of thrombus from the graft. Extravasation at the level of the humeral head was suspected. In similar fashion, the central aspect of the outflow vein was accessed retrograde under ultrasound with a micropuncture needle, exchanged for a transitional dilator. The retrograde dilator was exchanged in similar fashion for a 6 French vascular sheath. The Kumpe catheter was advanced over a glide wire across the arterial anastomosis. Focal outflow arteriogram performed. 5 mm x 2 cm Mustang angioplasty balloon was advanced and used to dilate the fistula and adjacent segment of the outflow vein. There was still no good antegrade flow through the outflow vein. 6 mm PTA of the outflow vein in the at the level of the lower humerus was performed. Shuntogram at this point showed extravasation centrally near the humeral head level. The 6 mm balloon was used to tamponade this segment. Follow-up injection showed return of slow antegrade flow through this segment without continued extravasation or residual/recurrent stenosis. The 5 mm balloon  was used to dilate the more central aspect of the cephalic arch. However, followup shuntogram showed minimal antegrade flow through the outflow vein and some recurrence of thrombus in the more peripheral aspect of the outflow vein. This was treated again with a 6 mm balloon but no antegrade flow could be restored. Recurrence of extravasation just below the level of the humeral head was identified, controlled with proximal balloon tamponade of the outflow vein near the fistula. At this point, the procedure was terminated.  IMPRESSION: 1. Unsuccessful percutaneous declot of left upper arm hemodialysis fistula. 2. 5 and 6 mm balloon angioplasty of central outflow vein stenoses, with extravasation near the level of the humeral head and cephalic arch. 3. Due to inability to return adequate flow through the fistula, patient will proceed to tunneled hemodialysis catheter placement to allow continued access.  ACCESS: Will require surgical revision or replacement of the fistula.   Electronically Signed   By: Arne Cleveland M.D.   On: 01/13/2014 16:04   Ir US Guide Vasc Access Left  01/13/2014   INDICATION: Failed attempted fistulalysis of left upper arm AV fistula. In need of venous access for continuation of hemodialysis.  EXAM: TUNNELED CENTRAL VENOUS HEMODIALYSIS CATHETER PLACEMENT WITH ULTRASOUND AND FLUOROSCOPIC GUIDANCE  MEDICATIONS: Ancef 2 gm IV; The IV antibiotic was given in an appropriate time interval prior to skin puncture.  CONTRAST:  None  ANESTHESIA/SEDATION: Versed 2 mg IV; Fentanyl 100 mcg IV  Total Moderate Sedation Time  45 minutes.  FLUOROSCOPY TIME:  18 seconds.  COMPLICATIONS: None immediate  PROCEDURE: Informed written consent for placement of a tunneled HD catheter had been obtained prior to initiation of the attempted left upper arm fistula lysis performed by my partner, Dr. Vernard Gambles.  The right neck and chest were prepped with chlorhexidine in a sterile fashion, and a sterile drape was applied  covering the operative field. Maximum barrier sterile technique with sterile gowns and  gloves were used for the procedure. A timeout was performed prior to the initiation of the procedure.  After creating a small venotomy incision, a micropuncture kit was utilized to access the right internal jugular vein under direct, real-time ultrasound guidance after the overlying soft tissues were anesthetized with 1% lidocaine with epinephrine. Ultrasound image documentation was performed. The microwire was kinked to measure appropriate catheter length. A stiff Glidewire was advanced to the level of the IVC and the micropuncture sheath was exchanged for a peel-away sheath. A hemosplit tunneled hemodialysis catheter measuring 23 cm from tip to cuff was tunneled in a retrograde fashion from the anterior chest wall to the venotomy incision.  The catheter was then placed through the peel-away sheath with tips ultimately positioned within the superior aspect of the right atrium. Final catheter positioning was confirmed and documented with a spot radiographic image. The catheter aspirates and flushes normally. The catheter was flushed with appropriate volume heparin dwells.  The catheter exit site was secured with a 0-Prolene retention suture. The venotomy incision was closed with an interrupted 4-0 Vicryl, Dermabond and Steri-strips. Dressings were applied. The patient tolerated the procedure well without immediate post procedural complication.  IMPRESSION: Successful placement of 23 cm tip to cuff tunneled hemodialysis catheter via the right internal jugular vein with tips terminating within the superior aspect of the right atrium. The catheter is ready for immediate use.   Electronically Signed   By: Sandi Mariscal M.D.   On: 01/13/2014 14:44   Ir Declot Left Mod Sed  01/13/2014   CLINICAL DATA:  Occluded left upper arm hemodialysis fistula. Previous interventions at [facility].  EXAM: DIALYSIS GRAFT DECLOT (attempted)  VENOUS  ANGIOPLASTY  ULTRASOUND GUIDANCE FOR VASCULAR ACCESS X2  FLUOROSCOPY TIME:  12 min 42 seconds  TECHNIQUE: The procedure, risks (including but not limited to bleeding, infection, organ damage ), benefits, and alternatives were explained to the patient. Questions regarding the procedure were encouraged and answered. The patient understands and consents to the procedure.  Intravenous Fentanyl and Versed were administered as conscious sedation during continuous cardiorespiratory monitoring by the radiology RN, with a total moderate sedation time of 75 minutes.  The outflow vein of the fistula was accessed antegrade just central to the fistula with 21-gauge micropuncture needle under real-time ultrasonic guidance after the overlying skin prepped with Betadine, draped in usual sterile fashion, infiltrated locally with 1%lidocaine. Needle exchanged over 018guidewire for transitional dilator through which 80m t-PA was administered. Ultrasound images were stored. Through the antegrade dilator, a Bentson wire was advanced centrally with the aid of an angled 5 [place] the catheter. This was exchanged for a 6 [place] sheath. Outflow venography was performed. This showed a diminutive central cephalic arch, and patency of the outflow central venous system through the SVC . 3000 units heparin were administered IV. The Kumpe was exchanged over guide wire for the AngioJet device, used to mechanically thrombolyse the thrombosed outflow vein and remove the thrombus from the native outflow. Injection showed partial clearance of the thrombus with no good antegrade flow. No extravasation evident. This was exchanged for a 6 x 4 mm Mustang angioplasty balloon, advanced to the outflow vein. Angioplasty of the outflow vein through its length was performed. Injection showed clearance of thrombus from the graft. Extravasation at the level of the humeral head was suspected. In similar fashion, the central aspect of the outflow vein  was accessed retrograde under ultrasound with a micropuncture needle, exchanged for a transitional dilator. The retrograde  dilator was exchanged in similar fashion for a 6 French vascular sheath. The Kumpe catheter was advanced over a glide wire across the arterial anastomosis. Focal outflow arteriogram performed. 5 mm x 2 cm Mustang angioplasty balloon was advanced and used to dilate the fistula and adjacent segment of the outflow vein. There was still no good antegrade flow through the outflow vein. 6 mm PTA of the outflow vein in the at the level of the lower humerus was performed. Shuntogram at this point showed extravasation centrally near the humeral head level. The 6 mm balloon was used to tamponade this segment. Follow-up injection showed return of slow antegrade flow through this segment without continued extravasation or residual/recurrent stenosis. The 5 mm balloon was used to dilate the more central aspect of the cephalic arch. However, followup shuntogram showed minimal antegrade flow through the outflow vein and some recurrence of thrombus in the more peripheral aspect of the outflow vein. This was treated again with a 6 mm balloon but no antegrade flow could be restored. Recurrence of extravasation just below the level of the humeral head was identified, controlled with proximal balloon tamponade of the outflow vein near the fistula. At this point, the procedure was terminated.  IMPRESSION: 1. Unsuccessful percutaneous declot of left upper arm hemodialysis fistula. 2. 5 and 6 mm balloon angioplasty of central outflow vein stenoses, with extravasation near the level of the humeral head and cephalic arch. 3. Due to inability to return adequate flow through the fistula, patient will proceed to tunneled hemodialysis catheter placement to allow continued access.  ACCESS: Will require surgical revision or replacement of the fistula.   Electronically Signed   By: Arne Cleveland M.D.   On: 01/13/2014 16:04     Scheduled Meds: . alteplase  4 mg Intracatheter Once  . aspirin EC  81 mg Oral Daily  . atorvastatin  10 mg Oral q1800  . calcium acetate  2,001 mg Oral TID WC  . darbepoetin (ARANESP) injection - DIALYSIS  100 mcg Intravenous Q Wed-HD  . diphenhydrAMINE  12.5 mg Intravenous Once  . DOPamine  2-20 mcg/kg/min Intravenous Once  . heparin subcutaneous  5,000 Units Subcutaneous 3 times per day  . midodrine  15 mg Oral 3 times per day on Sun Tue Thu Sat  . midodrine  5 mg Oral 2 times per day on Mon Wed Fri  . oxybutynin  5 mg Oral BID  . pantoprazole  40 mg Oral Daily  . polyethylene glycol  17 g Oral BID  . senna-docusate  1 tablet Oral BID   Continuous Infusions: . sodium chloride Stopped (01/11/14 1100)    Active Problems:   Hyperkalemia   Acute respiratory failure with hypoxemia   ESRD (end stage renal disease)   Bradycardia   Cardiogenic shock   Atrial fibrillation   Normocytic anemia    Time spent: 35 minutes/.    [facility] Pager [phone].   If 7PM-7AM, please contact night-coverage at [email or web address], password [facility] 01/14/2014, 5:01 PM  LOS: 7 days

## 2014-01-14 NOTE — Progress Notes (Signed)
Placed patient on CPAP via nasal mask, 10.0 cm H20 per patient comfort with 2 lpm O2 bleed in .  Patient tolerating well at this time. RN aware.

## 2014-01-14 NOTE — Progress Notes (Signed)
  [facility] Progress Note   Assessment/ Plan:   1. Status post complete heart block-appears to be resolved and currently in sinus rhythm. Ongoing evaluation and management by cardiology and plans for transition of care to cardiology in [place]. 2.ESRD: Plan for hemodialysis today per his usual outpatient schedule. Unfortunately, LUA aVF declot unsuccessful yesterday. 3. Anemia: Low hemoglobin, restart ESA and recheck iron stores 4. CKD-MBD: Ongoing phosphorus binders, continue to monitor 5. Nutrition: Low albumin from hospitalization/recent acute illness, encouraged lean protein intake and continue ONS 6. Hypertension: Blood pressures fairly controlled, continue to monitor  Subjective:   Reports to be feeling fair and states that he wants his permanent access placed through the vascular surgeons in [place]    Objective:   BP 113/58  Pulse 67  Temp(Src) 98.6 F (37 C) (Oral)  Resp 20  Ht  (1.626 m)  Wt 113 kg (249 lb 1.9 oz)  BMI 42.74 kg/m2  SpO2 96%  Physical Exam: Gen: Comfortably resting in bed, eating breakfast CVS: Pulse regular in rate and rhythm, S1 and S2 normal Resp: Clear to auscultation bilaterally-no rales Abd: Soft, obese, nontender Ext: Trace to 1+ lower extremity edema  Labs: BMET  Recent Labs Lab 01/09/14 0400 01/09/14 1500 01/10/14 0404 01/10/14 1558 01/11/14 0410 01/12/14 0500 01/13/14 0648  NA 134* 137 135* 138 138 139 134*  K 4.2 3.9 3.4* 3.9 3.7 4.1 4.3  CL 95* 97 96 97 99 100 94*  CO2 GLUCOSE 118* 109* 114* 102* 98 87 105*  BUN 42* 35* 35* 33* 26* 43* 42*  CREATININE 5.13* 4.02* 3.16* 3.03* 2.86* 5.21* 5.79*  CALCIUM 9.4 9.3 9.5 9.4 9.5 9.3 9.7  PHOS 3.8 3.3 3.0 3.3 3.0 4.6 4.3   CBC  Recent Labs Lab 01/07/14 1241 01/07/14 1243  01/09/14 0400 01/10/14 0404 01/11/14 0410 01/12/14 1258  WBC  --  9.5  < > 5.0 4.5 5.5 5.8  NEUTROABS  --  5.2  --   --   --   --   --   HGB 10.5* 9.5*  <  > 8.5* 8.2* 8.4* 8.2*  HCT 31.0* 29.1*  < > 24.7* 24.2* 24.7* 23.8*  MCV  --  93.0  < > 90.1 89.6 90.8 90.2  PLT  --  242  < > 158 153 162 175  < > = values in this interval not displayed.  @ Medications:    . alteplase  4 mg Intracatheter Once  . aspirin EC  81 mg Oral Daily  . atorvastatin  10 mg Oral q1800  . calcium acetate  2,001 mg Oral TID WC  . diphenhydrAMINE  12.5 mg Intravenous Once  . DOPamine  2-20 mcg/kg/min Intravenous Once  . feeding supplement (NEPRO CARB STEADY)  237 mL Oral BID BM  . heparin subcutaneous  5,000 Units Subcutaneous 3 times per day  . midodrine  15 mg Oral 3 times per day on Sun Tue Thu Sat  . midodrine  5 mg Oral 2 times per day on Mon Wed Fri  . oxybutynin  5 mg Oral BID  . pantoprazole  40 mg Oral Daily  . polyethylene glycol  17 g Oral Daily  . senna-docusate  1 tablet Oral BID   Zetta Bills, MD 01/14/2014, 8:56 AM

## 2014-01-14 NOTE — Progress Notes (Signed)
PT Cancellation Note  Patient Details Name: [name] MRN: [id number] DOB: March 05, 1951   Cancelled Treatment:    Reason Eval/Treat Not Completed: Patient at procedure or test/unavailable. Pt at HD, will continue to follow.    Vista Deck 01/14/2014, 4:47 PM

## 2014-01-14 NOTE — Procedures (Signed)
Patient seen on Hemodialysis. QB 400, UF goal 3.5L Treatment adjusted as needed.  Zetta Bills MD [facility]. Office # [phone] Pager # [phone] 2:21 PM

## 2014-01-15 LAB — RENAL FUNCTION PANEL
ALBUMIN: 3.2 g/dL — AB (ref 3.5–5.2)
Anion gap: 19 — ABNORMAL HIGH (ref 5–15)
BUN: 34 mg/dL — ABNORMAL HIGH (ref 6–23)
CHLORIDE: 92 meq/L — AB (ref 96–112)
CO2: 23 mEq/L (ref 19–32)
Calcium: 9.4 mg/dL (ref 8.4–10.5)
Creatinine, Ser: 5.42 mg/dL — ABNORMAL HIGH (ref 0.50–1.35)
GFR, EST AFRICAN AMERICAN: 12 mL/min — AB (ref >90)
GFR, EST NON AFRICAN AMERICAN: 10 mL/min — AB (ref >90)
Glucose, Bld: 100 mg/dL — ABNORMAL HIGH (ref 70–99)
POTASSIUM: 4.4 meq/L (ref 3.7–5.3)
Phosphorus: 3.9 mg/dL (ref 2.3–4.6)
SODIUM: 134 meq/L — AB (ref 137–147)

## 2014-01-15 LAB — FOLATE RBC: RBC FOLATE: 1270 ng/mL — AB (ref >280)

## 2014-01-15 MED ORDER — HYDROCODONE-ACETAMINOPHEN 5-325 MG PO TABS: 1.0000 | ORAL_TABLET | ORAL | Status: DC | PRN

## 2014-01-15 MED ORDER — ALBUTEROL SULFATE (2.5 MG/3ML) 0.083% IN NEBU: 2.5000 mg | INHALATION_SOLUTION | RESPIRATORY_TRACT | Status: AC | PRN

## 2014-01-15 MED ORDER — POLYETHYLENE GLYCOL 3350 17 G PO PACK: 17.0000 g | PACK | Freq: Two times a day (BID) | ORAL | Status: AC

## 2014-01-15 NOTE — Clinical Social Work Note (Signed)
Patient medically stable for discharge and will return to [name] in [place]. Discharge paperwork transmitted to facility and CSW will facilitate transport via ambulance. Sister [name] contacted by CSW to advise when [facility] called.  Genelle Bal, MSW, LCSW [phone]

## 2014-01-15 NOTE — Discharge Summary (Signed)
[facility]  Physician Discharge Summary   Patient ID: Andrew [name] MRN: [id number] DOB/AGE: Sep 08, 1950 63 y.o.  Admit date: 01/07/2014 Discharge date: 01/15/2014  PCP: Patient lives at a SNF in [place] and is cared for by SNF physicians  DISCHARGE DIAGNOSES:  Active Problems:   Hyperkalemia   Acute respiratory failure with hypoxemia   ESRD (end stage renal disease)   Bradycardia   Cardiogenic shock   Atrial fibrillation   Normocytic anemia   RECOMMENDATIONS FOR OUTPATIENT FOLLOW UP: 1. Please note that patient is now agreeable to use CPAP 2. Please arrange for follow up with cardiology at [place] in 3-4 weeks. 3. He should go for dialysis as per usual schedule, MWF 4. 4. Oxygen for now at 2 lpm. Can be removed based on RA saturations at SNF.  DISCHARGE CONDITION: fair  Diet recommendation: Heart Healthy  Filed Weights   01/14/14 1243 01/14/14 1647 01/14/14 2125  Weight: 114.4 kg (252 lb 3.3 oz) 111.6 kg (246 lb 0.5 oz) 113 kg (249 lb 1.9 oz)    INITIAL HISTORY: 63yo male with hx ESRD, CAD presented 8/19 with chest pain. Presented for his usual HD but his graft was clotted. He began c/o chest pain and SOB and was found to be significantly bradycardic, hyperkalemic and volume overloaded. Was sent to ER as STEMI initially which was cancelled. He had symptomatic bradycardia with syncope x2 and required external pacing. Became increasingly dyspneic and lost capture from external pacer. Seen by Dr. Einar Gip and was taken emergently cath lab for temporary pacer placement. This was then removed later.   Consultants:  Nephrologist  Vascular.  Cardiology (Dr. Einar Gip)  Procedures// Events.  2D ECHO 8/19 Study Conclusions - Left ventricle: The cavity size was normal. There was mild concentric hypertrophy. Systolic function was normal. The estimated ejection fraction was in the range of 55% to 60%. Regional wall motion abnormalities cannot be excluded. The study is  not technically sufficient to allow evaluation of LV diastolic function. - Left atrium: The atrium was moderately dilated. - Right atrium: The atrium was mildly dilated. - Poor echo window. The LV appears to be normal in systolic function, probably hyperdynamic.  8/19 admitted, complete heart block, intubated  8/19 brady cardiac arrest s/p temp transvenous pacer placement  8/19 CVVHD  8/21 pacer removed  8/22 extubated  8/24 went for declot but needed HD first    HOSPITAL COURSE:   Acute respiratory failure  Patient was initially intubated and admitted to the ICU. He improved as his heart rate stabilized. He was then extubated. He has been doing well since then. He has been on CPAP for history of OSA. He states he was not compliant with this in past and agrees to be wear it consistently. Repeat CXR shows atelectasis.   Severe symptomatic bradycardia  This was presumed due to hyperkalemia but not conclusively. No other etiology found. Patient required pressors. Patient was seen by cardiology. Dr Einar Gip placed temporary transvenous pacemaker. His heart rate stabilized. As he was dialysed his electrolytes improved and his heart rate improved. Temp pacer was removed 8/21. Heart rate has been stable. He is not on any rate limiting agents.  Atypical Paroxysmal A fib  This was noted while he was in the ICU. Initially anti-coagulation with warfarin was being considered but then patient converted to SR. Cardiology felt that at this time he doesn't need anticoagulation. But if this recurs he might need in future. Continue aspirin. He is not on any rate limiting  agents.   ESRD MWF with clotted graft  Failed Attempted lysis of left AVF on 8-25. New catheter placed in RIJ. He was dialysed on his regular schedule.  Anemia  Hgb stable.   Encephalopathy  This was secondary to symptomatic bradycardia. Now improved.   Increased his Miralax dose due to constipation.  Overall patient remains stable. He  can be discharged to his SNF.   PERTINENT LABS:  The results of significant diagnostics from this hospitalization (including imaging, microbiology, ancillary and laboratory) are listed below for reference.    Microbiology: Recent Results (from the past 240 hour(s))  MRSA PCR SCREENING     Status: None   Collection Time    01/07/14  3:03 PM      Result Value Ref Range Status   MRSA by PCR NEGATIVE  NEGATIVE Final   Comment:            The GeneXpert MRSA Assay (FDA     approved for NASAL specimens     only), is one component of a     comprehensive MRSA colonization     surveillance program. It is not     intended to diagnose MRSA     infection nor to guide or     monitor treatment for     MRSA infections.     Labs: Basic Metabolic Panel:  Recent Labs Lab 01/09/14 0400  01/10/14 0404 01/10/14 1558 01/11/14 0410 01/12/14 0500 01/13/14 0648 01/14/14 0800  NA 134*  < > 135* 138 138 139 134* 133*  K 4.2  < > 3.4* 3.9 3.7 4.1 4.3 4.1  CL 95*  < > 96 97 99 100 94* 90*  CO2 24  < > $R'24 25 27 26 24 23  'FJ$ GLUCOSE 118*  < > 114* 102* 98 87 105* 100*  BUN 42*  < > 35* 33* 26* 43* 42* 63*  CREATININE 5.13*  < > 3.16* 3.03* 2.86* 5.21* 5.79* 8.44*  CALCIUM 9.4  < > 9.5 9.4 9.5 9.3 9.7 9.3  MG 2.2  --  2.3  --  2.4  --   --   --   PHOS 3.8  < > 3.0 3.3 3.0 4.6 4.3 4.5  < > = values in this interval not displayed. Liver Function Tests:  Recent Labs Lab 01/10/14 1558 01/11/14 0410 01/12/14 0500 01/13/14 0648 01/14/14 0800  ALBUMIN 3.2* 2.9* 2.8* 3.3* 3.2*   CBC:  Recent Labs Lab 01/09/14 0400 01/10/14 0404 01/11/14 0410 01/12/14 1258 01/14/14 0800  WBC 5.0 4.5 5.5 5.8 7.7  HGB 8.5* 8.2* 8.4* 8.2* 8.5*  HCT 24.7* 24.2* 24.7* 23.8* 24.5*  MCV 90.1 89.6 90.8 90.2 88.1  PLT 158 153 162 175 200   CBG:  Recent Labs Lab 01/11/14 0354 01/11/14 0819 01/11/14 1216 01/11/14 1613 01/12/14 0824  GLUCAP 94 84 94 92 93    IMAGING STUDIES Dg Chest 2 View  01/14/2014    CLINICAL DATA:  End-stage renal disease and hypertension  EXAM: CHEST  2 VIEW  COMPARISON:  January 10, 2014  FINDINGS: Dual-lumen catheter present on the right. The more inferior tip of the dual-lumen catheter is at the cavoatrial junction. No pneumothorax.  There is patchy atelectasis in the left lower lobe. There is also mild atelectasis in the right mid lung. There is no frank airspace consolidation. Heart is upper normal in size with pulmonary vascularity within normal limits. No adenopathy. No bone lesions.  IMPRESSION: Central catheter as described without pneumothorax. Patchy  atelectasis bilaterally, more on the left than on the right. No frank consolidation.   Electronically Signed   By: Lowella Grip M.D.   On: 01/14/2014 08:06   Dg Abd 1 View  01/13/2014   CLINICAL DATA:  Abdominal pain.  EXAM: ABDOMEN - 1 VIEW  COMPARISON:  None.  FINDINGS: There are no dilated loops of large or small bowel. No visible free air or free fluid on this supine radiograph. No abnormal abdominal calcifications. No acute osseous abnormality. Degenerative changes in the lower lumbar spine. Dialysis catheter tip is seen in the right atrium.  IMPRESSION: Benign appearing abdomen.   Electronically Signed   By: Rozetta Nunnery M.D.   On: 01/13/2014 19:22   Ir Pta Venous Left  01/13/2014   CLINICAL DATA:  Occluded left upper arm hemodialysis fistula. Previous interventions at [facility].  EXAM: DIALYSIS GRAFT DECLOT (attempted)  VENOUS ANGIOPLASTY  ULTRASOUND GUIDANCE FOR VASCULAR ACCESS X2  FLUOROSCOPY TIME:  12 min 42 seconds  TECHNIQUE: The procedure, risks (including but not limited to bleeding, infection, organ damage ), benefits, and alternatives were explained to the patient. Questions regarding the procedure were encouraged and answered. The patient understands and consents to the procedure.  Intravenous Fentanyl and Versed were administered as conscious sedation during continuous cardiorespiratory monitoring by the  radiology RN, with a total moderate sedation time of 75 minutes.  The outflow vein of the fistula was accessed antegrade just central to the fistula with 21-gauge micropuncture needle under real-time ultrasonic guidance after the overlying skin prepped with Betadine, draped in usual sterile fashion, infiltrated locally with 1%lidocaine. Needle exchanged over 018guidewire for transitional dilator through which $RemoveBefor'4mg'ZYqEXoGciOJO$  t-PA was administered. Ultrasound images were stored. Through the antegrade dilator, a Bentson wire was advanced centrally with the aid of an angled 5 [place] Kumpe the catheter. This was exchanged for a 6 [place] vascular sheath. Outflow venography was performed. This showed a diminutive central cephalic arch, and patency of the outflow central venous system through the SVC . 3000 units heparin were administered IV. The Kumpe was exchanged over guide wire for the AngioJet device, used to mechanically thrombolyse the thrombosed outflow vein and remove the thrombus from the native outflow. Injection showed partial clearance of the thrombus with no good antegrade flow. No extravasation evident. This was exchanged for a 6 x 4 mm Mustang angioplasty balloon, advanced to the outflow vein. Angioplasty of the outflow vein through its length was performed. Injection showed clearance of thrombus from the graft. Extravasation at the level of the humeral head was suspected. In similar fashion, the central aspect of the outflow vein was accessed retrograde under ultrasound with a micropuncture needle, exchanged for a transitional dilator. The retrograde dilator was exchanged in similar fashion for a 6 French vascular sheath. The Kumpe catheter was advanced over a glide wire across the arterial anastomosis. Focal outflow arteriogram performed. 5 mm x 2 cm Mustang angioplasty balloon was advanced and used to dilate the fistula and adjacent segment of the outflow vein. There was still no good antegrade flow through the  outflow vein. 6 mm PTA of the outflow vein in the at the level of the lower humerus was performed. Shuntogram at this point showed extravasation centrally near the humeral head level. The 6 mm balloon was used to tamponade this segment. Follow-up injection showed return of slow antegrade flow through this segment without continued extravasation or residual/recurrent stenosis. The 5 mm balloon was used to dilate the more central aspect of the cephalic  arch. However, followup shuntogram showed minimal antegrade flow through the outflow vein and some recurrence of thrombus in the more peripheral aspect of the outflow vein. This was treated again with a 6 mm balloon but no antegrade flow could be restored. Recurrence of extravasation just below the level of the humeral head was identified, controlled with proximal balloon tamponade of the outflow vein near the fistula. At this point, the procedure was terminated.  IMPRESSION: 1. Unsuccessful percutaneous declot of left upper arm hemodialysis fistula. 2. 5 and 6 mm balloon angioplasty of central outflow vein stenoses, with extravasation near the level of the humeral head and cephalic arch. 3. Due to inability to return adequate flow through the fistula, patient will proceed to tunneled hemodialysis catheter placement to allow continued access.  ACCESS: Will require surgical revision or replacement of the fistula.   Electronically Signed   By: Arne Cleveland M.D.   On: 01/13/2014 16:04   Ir Pta Venous Left  01/13/2014   CLINICAL DATA:  Occluded left upper arm hemodialysis fistula. Previous interventions at [facility].  EXAM: DIALYSIS GRAFT DECLOT (attempted)  VENOUS ANGIOPLASTY  ULTRASOUND GUIDANCE FOR VASCULAR ACCESS X2  FLUOROSCOPY TIME:  12 min 42 seconds  TECHNIQUE: The procedure, risks (including but not limited to bleeding, infection, organ damage ), benefits, and alternatives were explained to the patient. Questions regarding the procedure were encouraged and  answered. The patient understands and consents to the procedure.  Intravenous Fentanyl and Versed were administered as conscious sedation during continuous cardiorespiratory monitoring by the radiology RN, with a total moderate sedation time of 75 minutes.  The outflow vein of the fistula was accessed antegrade just central to the fistula with 21-gauge micropuncture needle under real-time ultrasonic guidance after the overlying skin prepped with Betadine, draped in usual sterile fashion, infiltrated locally with 1%lidocaine. Needle exchanged over 018guidewire for transitional dilator through which $RemoveBefor'4mg'fyEGsUDRkOvG$  t-PA was administered. Ultrasound images were stored. Through the antegrade dilator, a Bentson wire was advanced centrally with the aid of an angled 5 [place] Kumpe the catheter. This was exchanged for a 6 [place] vascular sheath. Outflow venography was performed. This showed a diminutive central cephalic arch, and patency of the outflow central venous system through the SVC . 3000 units heparin were administered IV. The Kumpe was exchanged over guide wire for the AngioJet device, used to mechanically thrombolyse the thrombosed outflow vein and remove the thrombus from the native outflow. Injection showed partial clearance of the thrombus with no good antegrade flow. No extravasation evident. This was exchanged for a 6 x 4 mm Mustang angioplasty balloon, advanced to the outflow vein. Angioplasty of the outflow vein through its length was performed. Injection showed clearance of thrombus from the graft. Extravasation at the level of the humeral head was suspected. In similar fashion, the central aspect of the outflow vein was accessed retrograde under ultrasound with a micropuncture needle, exchanged for a transitional dilator. The retrograde dilator was exchanged in similar fashion for a 6 French vascular sheath. The Kumpe catheter was advanced over a glide wire across the arterial anastomosis. Focal outflow arteriogram  performed. 5 mm x 2 cm Mustang angioplasty balloon was advanced and used to dilate the fistula and adjacent segment of the outflow vein. There was still no good antegrade flow through the outflow vein. 6 mm PTA of the outflow vein in the at the level of the lower humerus was performed. Shuntogram at this point showed extravasation centrally near the humeral head level. The 6 mm balloon  was used to tamponade this segment. Follow-up injection showed return of slow antegrade flow through this segment without continued extravasation or residual/recurrent stenosis. The 5 mm balloon was used to dilate the more central aspect of the cephalic arch. However, followup shuntogram showed minimal antegrade flow through the outflow vein and some recurrence of thrombus in the more peripheral aspect of the outflow vein. This was treated again with a 6 mm balloon but no antegrade flow could be restored. Recurrence of extravasation just below the level of the humeral head was identified, controlled with proximal balloon tamponade of the outflow vein near the fistula. At this point, the procedure was terminated.  IMPRESSION: 1. Unsuccessful percutaneous declot of left upper arm hemodialysis fistula. 2. 5 and 6 mm balloon angioplasty of central outflow vein stenoses, with extravasation near the level of the humeral head and cephalic arch. 3. Due to inability to return adequate flow through the fistula, patient will proceed to tunneled hemodialysis catheter placement to allow continued access.  ACCESS: Will require surgical revision or replacement of the fistula.   Electronically Signed   By: Arne Cleveland M.D.   On: 01/13/2014 16:04   Ir Fluoro Guide Cv Line Right  01/13/2014   INDICATION: Failed attempted fistulalysis of left upper arm AV fistula. In need of venous access for continuation of hemodialysis.  EXAM: TUNNELED CENTRAL VENOUS HEMODIALYSIS CATHETER PLACEMENT WITH ULTRASOUND AND FLUOROSCOPIC GUIDANCE  MEDICATIONS: Ancef 2  gm IV; The IV antibiotic was given in an appropriate time interval prior to skin puncture.  CONTRAST:  None  ANESTHESIA/SEDATION: Versed 2 mg IV; Fentanyl 100 mcg IV  Total Moderate Sedation Time  45 minutes.  FLUOROSCOPY TIME:  18 seconds.  COMPLICATIONS: None immediate  PROCEDURE: Informed written consent for placement of a tunneled HD catheter had been obtained prior to initiation of the attempted left upper arm fistula lysis performed by my partner, Dr. Vernard Gambles.  The right neck and chest were prepped with chlorhexidine in a sterile fashion, and a sterile drape was applied covering the operative field. Maximum barrier sterile technique with sterile gowns and gloves were used for the procedure. A timeout was performed prior to the initiation of the procedure.  After creating a small venotomy incision, a micropuncture kit was utilized to access the right internal jugular vein under direct, real-time ultrasound guidance after the overlying soft tissues were anesthetized with 1% lidocaine with epinephrine. Ultrasound image documentation was performed. The microwire was kinked to measure appropriate catheter length. A stiff Glidewire was advanced to the level of the IVC and the micropuncture sheath was exchanged for a peel-away sheath. A hemosplit tunneled hemodialysis catheter measuring 23 cm from tip to cuff was tunneled in a retrograde fashion from the anterior chest wall to the venotomy incision.  The catheter was then placed through the peel-away sheath with tips ultimately positioned within the superior aspect of the right atrium. Final catheter positioning was confirmed and documented with a spot radiographic image. The catheter aspirates and flushes normally. The catheter was flushed with appropriate volume heparin dwells.  The catheter exit site was secured with a 0-Prolene retention suture. The venotomy incision was closed with an interrupted 4-0 Vicryl, Dermabond and Steri-strips. Dressings were applied.  The patient tolerated the procedure well without immediate post procedural complication.  IMPRESSION: Successful placement of 23 cm tip to cuff tunneled hemodialysis catheter via the right internal jugular vein with tips terminating within the superior aspect of the right atrium. The catheter is ready for immediate use.  Electronically Signed   By: Sandi Mariscal M.D.   On: 01/13/2014 14:44   Ir Angio Av Shunt Addl Access  01/13/2014   CLINICAL DATA:  Occluded left upper arm hemodialysis fistula. Previous interventions at [facility].  EXAM: DIALYSIS GRAFT DECLOT (attempted)  VENOUS ANGIOPLASTY  ULTRASOUND GUIDANCE FOR VASCULAR ACCESS X2  FLUOROSCOPY TIME:  12 min 42 seconds  TECHNIQUE: The procedure, risks (including but not limited to bleeding, infection, organ damage ), benefits, and alternatives were explained to the patient. Questions regarding the procedure were encouraged and answered. The patient understands and consents to the procedure.  Intravenous Fentanyl and Versed were administered as conscious sedation during continuous cardiorespiratory monitoring by the radiology RN, with a total moderate sedation time of 75 minutes.  The outflow vein of the fistula was accessed antegrade just central to the fistula with 21-gauge micropuncture needle under real-time ultrasonic guidance after the overlying skin prepped with Betadine, draped in usual sterile fashion, infiltrated locally with 1%lidocaine. Needle exchanged over 018guidewire for transitional dilator through which $RemoveBefor'4mg'TdDpCjKLAPLG$  t-PA was administered. Ultrasound images were stored. Through the antegrade dilator, a Bentson wire was advanced centrally with the aid of an angled 5 [place] Kumpe the catheter. This was exchanged for a 6 [place] vascular sheath. Outflow venography was performed. This showed a diminutive central cephalic arch, and patency of the outflow central venous system through the SVC . 3000 units heparin were administered IV. The Kumpe was exchanged over  guide wire for the AngioJet device, used to mechanically thrombolyse the thrombosed outflow vein and remove the thrombus from the native outflow. Injection showed partial clearance of the thrombus with no good antegrade flow. No extravasation evident. This was exchanged for a 6 x 4 mm Mustang angioplasty balloon, advanced to the outflow vein. Angioplasty of the outflow vein through its length was performed. Injection showed clearance of thrombus from the graft. Extravasation at the level of the humeral head was suspected. In similar fashion, the central aspect of the outflow vein was accessed retrograde under ultrasound with a micropuncture needle, exchanged for a transitional dilator. The retrograde dilator was exchanged in similar fashion for a 6 French vascular sheath. The Kumpe catheter was advanced over a glide wire across the arterial anastomosis. Focal outflow arteriogram performed. 5 mm x 2 cm Mustang angioplasty balloon was advanced and used to dilate the fistula and adjacent segment of the outflow vein. There was still no good antegrade flow through the outflow vein. 6 mm PTA of the outflow vein in the at the level of the lower humerus was performed. Shuntogram at this point showed extravasation centrally near the humeral head level. The 6 mm balloon was used to tamponade this segment. Follow-up injection showed return of slow antegrade flow through this segment without continued extravasation or residual/recurrent stenosis. The 5 mm balloon was used to dilate the more central aspect of the cephalic arch. However, followup shuntogram showed minimal antegrade flow through the outflow vein and some recurrence of thrombus in the more peripheral aspect of the outflow vein. This was treated again with a 6 mm balloon but no antegrade flow could be restored. Recurrence of extravasation just below the level of the humeral head was identified, controlled with proximal balloon tamponade of the outflow vein near the  fistula. At this point, the procedure was terminated.  IMPRESSION: 1. Unsuccessful percutaneous declot of left upper arm hemodialysis fistula. 2. 5 and 6 mm balloon angioplasty of central outflow vein stenoses, with extravasation near the level of the humeral  head and cephalic arch. 3. Due to inability to return adequate flow through the fistula, patient will proceed to tunneled hemodialysis catheter placement to allow continued access.  ACCESS: Will require surgical revision or replacement of the fistula.   Electronically Signed   By: Oley Balm M.D.   On: 01/13/2014 16:04   Ir US Guide Vasc Access Left  01/13/2014   INDICATION: Failed attempted fistulalysis of left upper arm AV fistula. In need of venous access for continuation of hemodialysis.  EXAM: TUNNELED CENTRAL VENOUS HEMODIALYSIS CATHETER PLACEMENT WITH ULTRASOUND AND FLUOROSCOPIC GUIDANCE  MEDICATIONS: Ancef 2 gm IV; The IV antibiotic was given in an appropriate time interval prior to skin puncture.  CONTRAST:  None  ANESTHESIA/SEDATION: Versed 2 mg IV; Fentanyl 100 mcg IV  Total Moderate Sedation Time  45 minutes.  FLUOROSCOPY TIME:  18 seconds.  COMPLICATIONS: None immediate  PROCEDURE: Informed written consent for placement of a tunneled HD catheter had been obtained prior to initiation of the attempted left upper arm fistula lysis performed by my partner, Dr. Deanne Coffer.  The right neck and chest were prepped with chlorhexidine in a sterile fashion, and a sterile drape was applied covering the operative field. Maximum barrier sterile technique with sterile gowns and gloves were used for the procedure. A timeout was performed prior to the initiation of the procedure.  After creating a small venotomy incision, a micropuncture kit was utilized to access the right internal jugular vein under direct, real-time ultrasound guidance after the overlying soft tissues were anesthetized with 1% lidocaine with epinephrine. Ultrasound image documentation was  performed. The microwire was kinked to measure appropriate catheter length. A stiff Glidewire was advanced to the level of the IVC and the micropuncture sheath was exchanged for a peel-away sheath. A hemosplit tunneled hemodialysis catheter measuring 23 cm from tip to cuff was tunneled in a retrograde fashion from the anterior chest wall to the venotomy incision.  The catheter was then placed through the peel-away sheath with tips ultimately positioned within the superior aspect of the right atrium. Final catheter positioning was confirmed and documented with a spot radiographic image. The catheter aspirates and flushes normally. The catheter was flushed with appropriate volume heparin dwells.  The catheter exit site was secured with a 0-Prolene retention suture. The venotomy incision was closed with an interrupted 4-0 Vicryl, Dermabond and Steri-strips. Dressings were applied. The patient tolerated the procedure well without immediate post procedural complication.  IMPRESSION: Successful placement of 23 cm tip to cuff tunneled hemodialysis catheter via the right internal jugular vein with tips terminating within the superior aspect of the right atrium. The catheter is ready for immediate use.   Electronically Signed   By: Simonne Come M.D.   On: 01/13/2014 14:44   Dg Chest Port 1 View  01/10/2014   CLINICAL DATA:  Endotracheal tube position.  EXAM: PORTABLE CHEST - 1 VIEW  COMPARISON:  01/09/2014 and 01/08/2014.  FINDINGS: 0547 hr. The endotracheal tube tip is unchanged within the mid trachea. Right IJ central venous catheter and nasogastric tube remain in place. There is no pneumothorax. There are lower lung volumes with increased bibasilar airspace opacities. No significant pleural effusion is seen. The heart size and mediastinal contours are stable.  IMPRESSION: 1. Stable position of the support system. 2. Worsening bibasilar airspace opacities, probably reflecting atelectasis. Aspiration not excluded.    Electronically Signed   By: Roxy Horseman M.D.   On: 01/10/2014 08:44   Dg Chest Port 1 View  01/09/2014  CLINICAL DATA:  CHF.  EXAM: PORTABLE CHEST - 1 VIEW  COMPARISON:  01/08/2014.  FINDINGS: Endotracheal tube, NG tube, right IJ line in stable position. Heart size stable. Pulmonary vascularity normal on today's exam. Interim partial clearing of bilateral pulmonary infiltrates. No significant pleural effusion or pneumothorax. No acute osseous abnormality.  IMPRESSION: 1. Line and tube positions stable . 2. Interim partial clearing of bilateral pulmonary infiltrates. This suggests partial clearing of congestive heart failure.   Electronically Signed   By: Marcello Moores  Register   On: 01/09/2014 07:48   Dg Chest Port 1 View  01/08/2014   CLINICAL DATA:  Followup CHF  EXAM: PORTABLE CHEST - 1 VIEW  COMPARISON:  01/07/2014  FINDINGS: Endotracheal tube in good position. Right jugular catheter tip in the SVC. NG tube enters the stomach.  Negative for pneumothorax.  Diffuse bilateral airspace disease is unchanged. This may represent pneumonia or edema. Bibasilar atelectasis again noted. Small pleural effusions  IMPRESSION: Diffuse bilateral airspace disease unchanged. Support lines remain in good position.   Electronically Signed   By: Franchot Gallo M.D.   On: 01/08/2014 07:39   Dg Chest Port 1 View  01/07/2014   CLINICAL DATA:  HD catheter and nasogastric tube placement  EXAM: PORTABLE CHEST - 1 VIEW  COMPARISON:  Prior chest x-ray earlier today at 12:47 p.m.  FINDINGS: Interval intubation. The tip of the ET tube is 1.6 cm above the carina. A right IJ approach central venous catheter has been placed. The tip overlies the upper SVC. A nasogastric tube is present. The proximal side hole can be identified overlying the gastric fundus. No evidence of pneumothorax or large hemothorax. Stable cardiomegaly. Improving pulmonary edema. Persistent bibasilar and right mid lung atelectasis. External defibrillator pads again per  just over the left chest. Femoral approach cardiac rhythm maintenance device may represent a transvenous pacer.  IMPRESSION: 1. Interval intubation. The tip of the ET tube is 1.6 cm above the carina. 2. New right IJ temporary hemodialysis catheter with the tip in the upper SVC. No evidence of complicating pneumothorax. 3. The tip of the nasogastric tube is within the stomach. 4. Probable femoral approach transvenous pacing lead. The lead projects over the right ventricle. 5. Slightly improved pulmonary edema.   Electronically Signed   By: Jacqulynn Cadet M.D.   On: 01/07/2014 16:41   Dg Chest Portable 1 View  01/07/2014   CLINICAL DATA:  Chest pain  EXAM: PORTABLE CHEST - 1 VIEW  COMPARISON:  PA and lateral chest x-ray of July 31, 2007  FINDINGS: The lungs are adequately inflated. The interstitial markings are increased bilaterally. There is small amount of fluid in the minor fissure on the right. The cardiopericardial silhouette is enlarged. The pulmonary vascularity is engorged. The mediastinum is top normal in width. External pacemaker pads are present.  IMPRESSION: The findings are consistent with congestive heart failure and pulmonary interstitial edema. There is no focal pneumonia.   Electronically Signed   By: David  Martinique   On: 01/07/2014 12:57   Ir Declot Left Mod Sed  01/13/2014   CLINICAL DATA:  Occluded left upper arm hemodialysis fistula. Previous interventions at [facility].  EXAM: DIALYSIS GRAFT DECLOT (attempted)  VENOUS ANGIOPLASTY  ULTRASOUND GUIDANCE FOR VASCULAR ACCESS X2  FLUOROSCOPY TIME:  12 min 42 seconds  TECHNIQUE: The procedure, risks (including but not limited to bleeding, infection, organ damage ), benefits, and alternatives were explained to the patient. Questions regarding the procedure were encouraged and answered. The patient understands  and consents to the procedure.  Intravenous Fentanyl and Versed were administered as conscious sedation during continuous cardiorespiratory  monitoring by the radiology RN, with a total moderate sedation time of 75 minutes.  The outflow vein of the fistula was accessed antegrade just central to the fistula with 21-gauge micropuncture needle under real-time ultrasonic guidance after the overlying skin prepped with Betadine, draped in usual sterile fashion, infiltrated locally with 1%lidocaine. Needle exchanged over 018guidewire for transitional dilator through which $RemoveBefor'4mg'lQHMuiDtBUyw$  t-PA was administered. Ultrasound images were stored. Through the antegrade dilator, a Bentson wire was advanced centrally with the aid of an angled 5 [place] Kumpe the catheter. This was exchanged for a 6 [place] vascular sheath. Outflow venography was performed. This showed a diminutive central cephalic arch, and patency of the outflow central venous system through the SVC . 3000 units heparin were administered IV. The Kumpe was exchanged over guide wire for the AngioJet device, used to mechanically thrombolyse the thrombosed outflow vein and remove the thrombus from the native outflow. Injection showed partial clearance of the thrombus with no good antegrade flow. No extravasation evident. This was exchanged for a 6 x 4 mm Mustang angioplasty balloon, advanced to the outflow vein. Angioplasty of the outflow vein through its length was performed. Injection showed clearance of thrombus from the graft. Extravasation at the level of the humeral head was suspected. In similar fashion, the central aspect of the outflow vein was accessed retrograde under ultrasound with a micropuncture needle, exchanged for a transitional dilator. The retrograde dilator was exchanged in similar fashion for a 6 French vascular sheath. The Kumpe catheter was advanced over a glide wire across the arterial anastomosis. Focal outflow arteriogram performed. 5 mm x 2 cm Mustang angioplasty balloon was advanced and used to dilate the fistula and adjacent segment of the outflow vein. There was still no good antegrade  flow through the outflow vein. 6 mm PTA of the outflow vein in the at the level of the lower humerus was performed. Shuntogram at this point showed extravasation centrally near the humeral head level. The 6 mm balloon was used to tamponade this segment. Follow-up injection showed return of slow antegrade flow through this segment without continued extravasation or residual/recurrent stenosis. The 5 mm balloon was used to dilate the more central aspect of the cephalic arch. However, followup shuntogram showed minimal antegrade flow through the outflow vein and some recurrence of thrombus in the more peripheral aspect of the outflow vein. This was treated again with a 6 mm balloon but no antegrade flow could be restored. Recurrence of extravasation just below the level of the humeral head was identified, controlled with proximal balloon tamponade of the outflow vein near the fistula. At this point, the procedure was terminated.  IMPRESSION: 1. Unsuccessful percutaneous declot of left upper arm hemodialysis fistula. 2. 5 and 6 mm balloon angioplasty of central outflow vein stenoses, with extravasation near the level of the humeral head and cephalic arch. 3. Due to inability to return adequate flow through the fistula, patient will proceed to tunneled hemodialysis catheter placement to allow continued access.  ACCESS: Will require surgical revision or replacement of the fistula.   Electronically Signed   By: Arne Cleveland M.D.   On: 01/13/2014 16:04    DISCHARGE EXAMINATION: Filed Vitals:   01/14/14 1800 01/14/14 2125 01/14/14 2350 01/15/14 0525  BP: 92/49 95/49  110/47  Pulse: 80 83 81 65  Temp: 98 F (36.7 C) 98.2 F (36.8 C)  98.6 F (37  C)  TempSrc: Oral Oral  Oral  Resp: $Remo'18 17 18 17  'TEnAU$ Height:      Weight:  113 kg (249 lb 1.9 oz)    SpO2: 98% 94% 95% 94%   General appearance: alert, cooperative, appears stated age and no distress Resp: clear to auscultation bilaterally Cardio: regular rate and  rhythm, S1, S2 normal, no murmur, click, rub or gallop  Tele: Sinus rhythm  DISPOSITION: SNF at [facility]  Discharge Instructions   Diet - low sodium heart healthy    Complete by:  As directed      Discharge instructions    Complete by:  As directed   Use CPAP every night and while sleeping. You will need to see a cardiologist for follow up in 3-4 weeks. Go for dialysis as before. Need dialysis catheter care.     Increase activity slowly    Complete by:  As directed            ALLERGIES:  Allergies  Allergen Reactions  . Codeine Other (See Comments)    Per MAR    Current Discharge Medication List    START taking these medications   Details  albuterol (PROVENTIL) (2.5 MG/3ML) 0.083% nebulizer solution Take 3 mLs (2.5 mg total) by nebulization every 4 (four) hours as needed for wheezing or shortness of breath. Qty: 75 mL, Refills: 12      CONTINUE these medications which have CHANGED   Details  HYDROcodone-acetaminophen (NORCO/VICODIN) 5-325 MG per tablet Take 1 tablet by mouth every 4 (four) hours as needed for moderate pain. Qty: 30 tablet, Refills: 0    polyethylene glycol (MIRALAX / GLYCOLAX) packet Take 17 g by mouth 2 (two) times daily. Qty: 14 each, Refills: 0      CONTINUE these medications which have NOT CHANGED   Details  acetaminophen (TYLENOL) 325 MG tablet Take 650 mg by mouth 2 (two) times daily. scheduled    artificial tears (LACRILUBE) OINT ophthalmic ointment Place 1 application into both eyes every 4 (four) hours as needed for dry eyes. 1 application is 2 drops of gel in each eye per [facility]    aspirin EC 81 MG tablet Take 81 mg by mouth daily.    atorvastatin (LIPITOR) 10 MG tablet Take 10 mg by mouth daily at 6 PM.     calcium acetate (PHOSLO) 667 MG capsule Take 2,001 mg by mouth 3 (three) times daily with meals.     diphenhydrAMINE (BENADRYL) 25 MG tablet Take 25 mg by mouth every 8 (eight) hours as needed for itching (hives).    midodrine  (PROAMATINE) 5 MG tablet Take 5-15 mg by mouth See admin instructions. Take 3 tablets (15 mg) 3 times daily on Sunday, Tuesday, Thursday and Saturday and 1 tablet (5 mg) 2 times daily on Monday, Wednesday and Friday    omeprazole (PRILOSEC) 20 MG capsule Take 20 mg by mouth daily.    oxybutynin (DITROPAN-XL) 5 MG 24 hr tablet Take 5 mg by mouth 2 (two) times daily.    sennosides-docusate sodium (SENOKOT-S) 8.6-50 MG tablet Take 1 tablet by mouth 2 (two) times daily.        Follow-up Information   Follow up with Cardiologist at [facility]. Schedule an appointment as soon as possible for a visit in 3 weeks. (for follow up of afib, bradycardia)       TOTAL DISCHARGE TIME: 35 mins  [place] Pager [phone]  01/15/2014, 8:22 AM  Disclaimer: This note was dictated with  voice recognition software. Similar sounding words can inadvertently be transcribed and may not be corrected upon review.

## 2014-01-15 NOTE — Discharge Instructions (Signed)

## 2014-01-15 NOTE — Care Management Note (Signed)
CARE MANAGEMENT NOTE 01/15/2014  Patient:  Andrew [name], Andrew [name]   Account Number:  [id number]  Date Initiated:  01/07/2014  Documentation initiated by:  Junius Creamer  Subjective/Objective Assessment:   adm w brady     Action/Plan:   Pt is SNF resident and plans to return to [facility].   Anticipated DC Date:  01/15/2014   Anticipated DC Plan:  SKILLED NURSING FACILITY         Choice offered to / List presented to:             Status of service:  Completed, signed off Medicare Important Message given?  YES (If response is "NO", the following Medicare IM given date fields will be blank) Date Medicare IM given:  01/12/2014 Medicare IM given by:  Junius Creamer Date Additional Medicare IM given:  01/15/2014 Additional Medicare IM given by:    Discharge Disposition:  SKILLED NURSING FACILITY  Per UR Regulation:  Reviewed for med. necessity/level of care/duration of stay  If discussed at Long Length of Stay Meetings, dates discussed:   01/13/2014  01/15/2014    Comments:

## 2014-01-15 NOTE — Clinical Social Work Psychosocial (Signed)
Clinical Social Work Department BRIEF PSYCHOSOCIAL ASSESSMENT 01/15/2014  Patient:  Andrew [name], Andrew [name]     Account Number:  [id number]     Admit date:  01/07/2014  Clinical Social Worker:  Delmer Islam  Date/Time:  01/15/2014 10:22 AM  Referred by:  Physician  Date Referred:  01/14/2014 Referred for  SNF Placement   Other Referral:   Interview type:  Patient Other interview type:    PSYCHOSOCIAL DATA Living Status:  FACILITY Admitted from facility:  [facility] Level of care:  Skilled Nursing Facility Primary support name:  [name] Primary support relationship to patient:  SIBLING Degree of support available:   Good support from sister. Patient has contacted sister to inform her of his discharge back to facility today.    CURRENT CONCERNS Current Concerns  Post-Acute Placement   Other Concerns:    SOCIAL WORK ASSESSMENT / PLAN CSW talked with patient about his medical readiness for d/c per MD and confirmed his intent to return to [facility]. When asked about contacting family regarding d/c, patient reported that he had contacted his sister regarding his discharge.    Nurse case manager talked with patient to give Medicare IM letter and was informed that prior to SNF he lived at an ALF until they felt he was to medically complex for them. He then transitioned to [facility].   Assessment/plan status:  No Further Intervention Required Other assessment/ plan:   Information/referral to community resources:   None needed or requested at this time.    PATIENT'S/FAMILY'S RESPONSE TO PLAN OF CARE: Patient was sitting in the chair in his room when CSW visited. He is alert and oriented and was aware of his discharge today back to SNF.

## 2014-01-15 NOTE — Progress Notes (Signed)
Physical Therapy Treatment Patient Details Name: Andrew [name] MRN: [id number] DOB: [birth year] Today's Date: 01/15/2014    History of Present Illness Andrew [name] is an 63 y.o. male with hx of ESRD. He had a (L)UE AVF placed a couple years ago and  Eventually has been able to use it. He has been admitted for SIRS and his AVF was found to be clotted. His condition is improved, the pt has been receiving HD via Trialysis catheter. IR is asked to declot AVF.  Pt. has history of cerebral palsy    PT Comments    Pt. Progressing with his mobility.  States his baseline is that he can walk several hundred feet at a time with RW.  Sats and HR stable with 2L O2 during session  Follow Up Recommendations  SNF;Supervision/Assistance - 24 hour     Equipment Recommendations  None recommended by PT    Recommendations for Other Services       Precautions / Restrictions Precautions Precautions: Fall Precaution Comments: monitor O2 during activity, pt very weak Restrictions Weight Bearing Restrictions: No    Mobility  Bed Mobility Overal bed mobility: Needs Assistance Bed Mobility: Supine to Sit     Supine to sit: Supervision     General bed mobility comments: Pt. moves  supine to sit with HOB elevated and with use of bedrail and UE assist.  Transfers Overall transfer level: Needs assistance Equipment used: Rolling walker (2 wheeled) Transfers: Sit to/from Stand Sit to Stand: Mod assist         General transfer comment: Pt. needed mod assist for power up to stand.  Facilitation for upright stance as much as is allowed by his LE weakness from CP  Ambulation/Gait Ambulation/Gait assistance: Mod assist;+2 safety/equipment Ambulation Distance (Feet): 30 Feet (15 X 2) Assistive device: Rolling walker (2 wheeled) Gait Pattern/deviations: Step-to pattern Gait velocity: decreased Gait velocity interpretation: Below normal speed for age/gender General Gait Details: Keeps LEs  flexed and somewhat adducted (typical CP walking pattern) but able to tolerate 30' before fatigue.  Second prson helpful for bringing reclienr chair and for Music therapist Rankin (Stroke Patients Only)       Balance                                    Cognition Arousal/Alertness: Awake/alert Behavior During Therapy: WFL for tasks assessed/performed Overall Cognitive Status: Within Functional Limits for tasks assessed                      Exercises      General Comments        Pertinent Vitals/Pain Pain Assessment: No/denies pain    Home Living                      Prior Function            PT Goals (current goals can now be found in the care plan section) Progress towards PT goals: Progressing toward goals    Frequency  Min 3X/week    PT Plan Current plan remains appropriate    Co-evaluation             End of Session Equipment Utilized During Treatment: Gait belt;Oxygen Activity Tolerance: Patient limited by fatigue Patient left: in chair;with  call bell/phone within reach;with chair alarm set     Time: 0937-1000 PT Time Calculation (min): 23 min  Charges:  $Gait Training: 23-37 mins                    G Codes:      Ferman Hamming 01/15/2014, 10:42 AM Weldon Picking PT Acute Rehab Services [phone] Beeper [phone]

## 2014-01-15 NOTE — Progress Notes (Signed)
Pt prepared for d/c to SNF. IV d/c'd. Skin intact except as most recently charted. Vitals are stable. Report called to Okey Regal at [facility] (receiving facility). Pt to be transported by ambulance service.  Peri Maris, MBA, BS, RN

## 2014-04-02 ENCOUNTER — Ambulatory Visit: Payer: Self-pay | Admitting: Vascular Surgery

## 2014-04-30 ENCOUNTER — Encounter (HOSPITAL_COMMUNITY): Payer: Self-pay | Admitting: Cardiology

## 2014-05-04 ENCOUNTER — Inpatient Hospital Stay: Payer: Self-pay | Admitting: Internal Medicine

## 2014-05-04 LAB — COMPREHENSIVE METABOLIC PANEL
ALT: 24 U/L
ANION GAP: 11 (ref 7–16)
Albumin: 3.5 g/dL (ref 3.4–5.0)
Alkaline Phosphatase: 349 U/L — ABNORMAL HIGH
BILIRUBIN TOTAL: 0.4 mg/dL (ref 0.2–1.0)
BUN: 51 mg/dL — AB (ref 7–18)
CREATININE: 10.96 mg/dL — AB (ref 0.60–1.30)
Calcium, Total: 8.8 mg/dL (ref 8.5–10.1)
Chloride: 96 mmol/L — ABNORMAL LOW (ref 98–107)
Co2: 24 mmol/L (ref 21–32)
EGFR (Non-African Amer.): 5 — ABNORMAL LOW
GFR CALC AF AMER: 6 — AB
Glucose: 87 mg/dL (ref 65–99)
Osmolality: 276 (ref 275–301)
POTASSIUM: 6.4 mmol/L — AB (ref 3.5–5.1)
SGOT(AST): 20 U/L (ref 15–37)
SODIUM: 131 mmol/L — AB (ref 136–145)
Total Protein: 8.1 g/dL (ref 6.4–8.2)

## 2014-05-04 LAB — BASIC METABOLIC PANEL
ANION GAP: 9 (ref 7–16)
BUN: 54 mg/dL — ABNORMAL HIGH (ref 7–18)
CALCIUM: 9 mg/dL (ref 8.5–10.1)
CHLORIDE: 94 mmol/L — AB (ref 98–107)
CO2: 29 mmol/L (ref 21–32)
Creatinine: 11.27 mg/dL — ABNORMAL HIGH (ref 0.60–1.30)
EGFR (African American): 6 — ABNORMAL LOW
EGFR (Non-African Amer.): 5 — ABNORMAL LOW
GLUCOSE: 86 mg/dL (ref 65–99)
Osmolality: 279 (ref 275–301)
POTASSIUM: 6.4 mmol/L — AB (ref 3.5–5.1)
SODIUM: 132 mmol/L — AB (ref 136–145)

## 2014-05-04 LAB — CBC
HCT: 32.5 % — ABNORMAL LOW (ref 40.0–52.0)
HGB: 10.5 g/dL — ABNORMAL LOW (ref 13.0–18.0)
MCH: 30.2 pg (ref 26.0–34.0)
MCHC: 32.3 g/dL (ref 32.0–36.0)
MCV: 93 fL (ref 80–100)
Platelet: 289 10*3/uL (ref 150–440)
RBC: 3.48 10*6/uL — ABNORMAL LOW (ref 4.40–5.90)
RDW: 15 % — ABNORMAL HIGH (ref 11.5–14.5)
WBC: 7.2 10*3/uL (ref 3.8–10.6)

## 2014-05-04 LAB — PROTIME-INR
INR: 1
PROTHROMBIN TIME: 12.7 s (ref 11.5–14.7)

## 2014-05-04 LAB — TROPONIN I: Troponin-I: <0.02 ng/mL

## 2014-05-04 LAB — CK TOTAL AND CKMB (NOT AT ARMC)
CK, TOTAL: 91 U/L (ref 39–308)
CK-MB: 1.1 ng/mL (ref 0.5–3.6)

## 2014-05-04 LAB — APTT: ACTIVATED PTT: 37.5 s — AB (ref 23.6–35.9)

## 2014-05-04 LAB — MAGNESIUM: Magnesium: 2.1 mg/dL

## 2014-05-04 LAB — PHOSPHORUS: Phosphorus: 5.5 mg/dL — ABNORMAL HIGH (ref 2.5–4.9)

## 2014-05-05 DIAGNOSIS — I495 Sick sinus syndrome: Secondary | ICD-10-CM

## 2014-05-05 DIAGNOSIS — N186 End stage renal disease: Secondary | ICD-10-CM

## 2014-05-05 LAB — BASIC METABOLIC PANEL
Anion Gap: 8 (ref 7–16)
BUN: 25 mg/dL — ABNORMAL HIGH (ref 7–18)
CO2: 31 mmol/L (ref 21–32)
Calcium, Total: 8.8 mg/dL (ref 8.5–10.1)
Chloride: 97 mmol/L — ABNORMAL LOW (ref 98–107)
Creatinine: 7.21 mg/dL — ABNORMAL HIGH (ref 0.60–1.30)
EGFR (Non-African Amer.): 8 — ABNORMAL LOW
GFR CALC AF AMER: 10 — AB
GLUCOSE: 94 mg/dL (ref 65–99)
OSMOLALITY: 276 (ref 275–301)
POTASSIUM: 4.7 mmol/L (ref 3.5–5.1)
Sodium: 136 mmol/L (ref 136–145)

## 2014-05-05 LAB — CBC WITH DIFFERENTIAL/PLATELET
Basophil #: 0 10*3/uL (ref 0.0–0.1)
Basophil %: 0.4 %
EOS PCT: 3.2 %
Eosinophil #: 0.1 10*3/uL (ref 0.0–0.7)
HCT: 31.3 % — ABNORMAL LOW (ref 40.0–52.0)
HGB: 10.3 g/dL — ABNORMAL LOW (ref 13.0–18.0)
Lymphocyte #: 1.1 10*3/uL (ref 1.0–3.6)
Lymphocyte %: 25.2 %
MCH: 30.9 pg (ref 26.0–34.0)
MCHC: 32.9 g/dL (ref 32.0–36.0)
MCV: 94 fL (ref 80–100)
MONO ABS: 0.6 x10 3/mm (ref 0.2–1.0)
MONOS PCT: 13.7 %
Neutrophil #: 2.6 10*3/uL (ref 1.4–6.5)
Neutrophil %: 57.5 %
PLATELETS: 237 10*3/uL (ref 150–440)
RBC: 3.33 10*6/uL — AB (ref 4.40–5.90)
RDW: 15 % — AB (ref 11.5–14.5)
WBC: 4.5 10*3/uL (ref 3.8–10.6)

## 2014-05-06 LAB — BASIC METABOLIC PANEL
ANION GAP: 8 (ref 7–16)
BUN: 41 mg/dL — AB (ref 7–18)
CO2: 31 mmol/L (ref 21–32)
Calcium, Total: 8.7 mg/dL (ref 8.5–10.1)
Chloride: 95 mmol/L — ABNORMAL LOW (ref 98–107)
Creatinine: 9.91 mg/dL — ABNORMAL HIGH (ref 0.60–1.30)
GFR CALC AF AMER: 7 — AB
GFR CALC NON AF AMER: 6 — AB
GLUCOSE: 85 mg/dL (ref 65–99)
OSMOLALITY: 278 (ref 275–301)
POTASSIUM: 5.6 mmol/L — AB (ref 3.5–5.1)
Sodium: 134 mmol/L — ABNORMAL LOW (ref 136–145)

## 2014-05-06 LAB — PHOSPHORUS: Phosphorus: 5.8 mg/dL — ABNORMAL HIGH (ref 2.5–4.9)

## 2014-05-28 ENCOUNTER — Ambulatory Visit: Payer: Self-pay | Admitting: Vascular Surgery

## 2014-05-28 LAB — POTASSIUM: Potassium: 3.8 mmol/L (ref 3.5–5.1)

## 2014-06-04 ENCOUNTER — Encounter (HOSPITAL_COMMUNITY): Payer: Self-pay | Admitting: Cardiovascular Disease

## 2014-06-04 ENCOUNTER — Ambulatory Visit: Payer: Self-pay | Admitting: Vascular Surgery

## 2014-06-04 LAB — POTASSIUM: Potassium: 4.3 mmol/L (ref 3.5–5.1)

## 2014-07-16 ENCOUNTER — Ambulatory Visit: Payer: Self-pay | Admitting: Vascular Surgery

## 2014-07-23 ENCOUNTER — Ambulatory Visit: Payer: Self-pay | Admitting: Vascular Surgery

## 2014-08-20 ENCOUNTER — Ambulatory Visit
Admit: 2014-08-20 | Disposition: A | Discharge status: Home / Self Care | Payer: Self-pay | Attending: Vascular Surgery | Admitting: Vascular Surgery

## 2014-08-20 LAB — BASIC METABOLIC PANEL
Anion Gap: 9 (ref 7–16)
BUN: 26 mg/dL — AB
CHLORIDE: 100 mmol/L — AB
Calcium, Total: 9.2 mg/dL
Co2: 29 mmol/L
Creatinine: 5.99 mg/dL — ABNORMAL HIGH
GFR CALC AF AMER: 11 — AB
GFR CALC NON AF AMER: 9 — AB
GLUCOSE: 90 mg/dL
POTASSIUM: 4.5 mmol/L
SODIUM: 138 mmol/L

## 2014-08-20 LAB — CBC
HCT: 33.9 % — AB (ref 40.0–52.0)
HGB: 11.3 g/dL — ABNORMAL LOW (ref 13.0–18.0)
MCH: 29.4 pg (ref 26.0–34.0)
MCHC: 33.2 g/dL (ref 32.0–36.0)
MCV: 89 fL (ref 80–100)
Platelet: 235 10*3/uL (ref 150–440)
RBC: 3.83 10*6/uL — ABNORMAL LOW (ref 4.40–5.90)
RDW: 15.1 % — ABNORMAL HIGH (ref 11.5–14.5)
WBC: 4.8 10*3/uL (ref 3.8–10.6)

## 2014-08-20 LAB — PROTIME-INR
INR: 0.9
PROTHROMBIN TIME: 12.7 s

## 2014-09-08 NOTE — Op Note (Signed)
PATIENT NAME:  Andrew [name], [name] MR#:  [id number] DATE OF BIRTH:  May 29, 1950  DATE OF PROCEDURE:  02/29/2012  PREOPERATIVE DIAGNOSES:  1. Endstage renal disease.  2. Morbid obesity.  3. Hypertension.   POSTOPERATIVE DIAGNOSES:  1. Endstage renal disease.  2. Morbid obesity.  3. Hypertension.   PROCEDURE: Left brachiocephalic AV fistula creation.   SURGEON: Annice NeedyJason S. Toney Lizaola, M.D.   ANESTHESIA: General.   ESTIMATED BLOOD LOSS: Approximately 25 mL.   INDICATION FOR PROCEDURE: This is a 64 year old African American male with endstage renal disease. He is currently catheter -dependent. He appears to have adequate anatomy for a brachiocephalic AV fistula creation on the left. Risks and benefits were discussed. Informed consent was obtained.   DESCRIPTION OF PROCEDURE: The patient was brought to the operative suite and after an adequate level of general anesthesia was obtained, left upper extremity was sterilely prepped and draped and a sterile surgical field was created. A curvilinear incision was created along the antecubital fossa and we dissected out the cephalic vein. This was a decent size for fistula creation, right around 3 mm. Several branches were ligated and divided between silk ties. To help facilitate our exposure, particularly given his large size, a larger incision with slightly more extensive dissection was necessary to be able to free the vein to come down for fistula creation. Once this was dissected out, it was marked for orientation. The brachial artery was dissected out and vessel loops were placed proximally and distally. The patient was systemically heparinized and control was pulled up on the vessel loops. An anterior arteriotomy was created with a #11 blade and extended with Potts scissors. The vein was ligated and divided distally, cut and beveled to an appropriate length to match the anterior wall arteriotomy, and an anastomosis was created with a running 6-0 Prolene suture in  the usual fashion. A single 6-0 Prolene patch suture was used for hemostasis and hemostasis was complete. There was a nice thrill within the AV fistula and a palpable pulse in the brachial artery distal to the anastomosis. At this point I elected to terminate the procedure. Surgicel was placed. The wound was closed with a running 3-0 Vicryl and 4-0 Monocryl. Dermabond was placed as a dressing. The patient tolerated the procedure well and was taken to the recovery room in stable condition.    ____________________________ Annice NeedyJason S. Mikyla Schachter, MD jsd:bjt D: 03/01/2012 09:12:59 ET T: 03/01/2012 11:01:02 ET JOB#: [id number]  cc: Annice NeedyJason S. Yarethzy Croak, MD, <Dictator> Annice NeedyJASON S Ensley Blas MD ELECTRONICALLY SIGNED 03/04/2012 13:29

## 2014-09-08 NOTE — Op Note (Signed)
PATIENT NAME:  Andrew [name], [name] MR#:  [id number] DATE OF BIRTH:  1950/11/10  DATE OF PROCEDURE:  12/05/2011  PREOPERATIVE DIAGNOSES:  1. Sepsis secondary to infection of right arm fistula.  2. End-stage renal disease requiring hemodialysis.  3. Congestive heart failure with hypoxia.   POSTOPERATIVE DIAGNOSES:  1. Sepsis secondary to infection of right arm fistula.  2. End-stage renal disease requiring hemodialysis.  3. Congestive heart failure with hypoxia.   PROCEDURE PERFORMED: Insertion right IJ triple-lumen temporary dialysis catheter with ultrasound guidance.   SURGEON: Renford DillsGregory G. Tyshia Fenter, MD  DESCRIPTION OF PROCEDURE: Patient is in the hospital bed. He is positioned supine with slight Trendelenburg. Ultrasound is placed in a sterile sleeve. Ultrasound is utilized secondary to lack of appropriate landmarks. Jugular vein is identified. It is echolucent, homogeneous, easily compressible indicating patency. Image is recorded for the permanent record and under direct ultrasound visualization after 1% lidocaine has been infiltrated a Seldinger needle is inserted into the jugular vein. J-wire is advanced followed by a counter incision with an 11 blade scalpel, followed by dilator. Triple-lumen temporary dialysis catheter is then advanced over the wire without difficulty. All three lumens aspirate and flush easily. Catheter is secured to the skin of the neck with 2-0 silk and a sterile dressing is applied. Patient tolerated procedure well and there were no immediate complications.   ____________________________ Renford DillsGregory G. Tatyanna Cronk, MD ggs:cms D: 12/05/2011 14:22:59 ET T: 12/05/2011 14:41:14 ET JOB#: [id number] cc: Renford DillsGregory G. Imogine Carvell, MD, <Dictator> Mosetta PigeonHarmeet Singh, MD Renford DillsGREGORY G Allisen Pidgeon MD ELECTRONICALLY SIGNED 12/14/2011 12:26

## 2014-09-08 NOTE — Op Note (Signed)
PATIENT NAME:  Andrew [name], Andrew [name] MR#:  [id number] DATE OF BIRTH:  11/28/1950  DATE OF PROCEDURE:  12/07/2011  PREOPERATIVE DIAGNOSES:  1. End-stage renal disease.  2. Infected right arm arteriovenous fistula.  3. Morbid obesity.   POSTOPERATIVE DIAGNOSES:  1. End-stage renal disease.  2. Infected right arm arteriovenous fistula. 3. Morbid obesity.  PROCEDURE PERFORMED: Excision/resection of infected right brachiocephalic arteriovenous fistula.   SURGEON: Annice Needy, MD   ANESTHESIA: General.   ESTIMATED BLOOD LOSS: Approximately 500 mL.   INDICATION FOR PROCEDURE: This is a 64 year old African American male with progressive skin breakdown and clear infection of a right arm AV fistula. 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 This is enlarged despite several days of antibiotics and avoidance of use of the fistula with transition to a dialysis catheter. At this point, he is going to need to be taken to the Operating Room for debridement of this. We discussed that if fistula salvage was possible we would do our best to salvage the fistula, but this may have to be removed for safety. The patient voiced his understanding and agreed to proceed.   DESCRIPTION OF PROCEDURE: The patient was brought to the operative suite and after an adequate level of general anesthesia was obtained, the right upper extremity was sterilely prepped and draped, and a sterile surgical field was created. I made an incision proximal to the skin breakdown toward the antecubital fossa. On entering the area overlying the fistula, there was a significant amount of purulent material and bright red pulsatile blood encountered. I held pressure over the fistula. There was clearly an open communication.  On further dissection down, this did appear to be pseudoaneurysm which was grossly infected and involving a significant portion of the anterior wall of the fistula with a large opening in the AV fistula that  was clearly not going to be salvageable at this point. The AV fistula itself was inflamed and indurated as well in this direct area, and so sacrifice of the fistula was planned. I was able to gain proximal control with a clamp. After some initial difficulty, there was not much backbleeding. The fistula was then dissected out to a more normal-appearing portion of the fistula several centimeters beyond the infected pseudoaneurysm up the arm in the distal location. It was then ligated with a 2-0 silk stick tie and a 2-0 silk free tie and transected. The fistula was then dissected back to the more proximal portion of the fistula where after the induration cleared, and there was reasonably normal-appearing fistula, likely in the range of 3 to 4 cm beyond the anastomosis, it was controlled with a vascular clamp. It was oversewn with two-layer 5-0 Prolene closure and 2-0 silk ties. The wound was then irrigated. There was some necrotic tissue that was excised from this skin soft tissue area, and a large amount of infected hematoma was removed. I then closed the deeper tissues with interrupted 2-0 Vicryl sutures to ensure that there was closure over the stumps of the fistula that remained. The skin was left open, and a silver back sponge was cut to fit the wound, and Ioban was used to gain an occlusive seal. The patient was then awakened from anesthesia and taken to the recovery room in stable condition having tolerated the procedure well.   ____________________________ Annice Needy, MD jsd:cbb D: 12/07/2011 15:57:20 ET T: 12/07/2011 16:33:00 ET JOB#: [id number]  cc: Annice Needy, MD, <Dictator> Meindert A. Lacie Scotts, MD Munsoor N.  Cherylann RatelLateef, MD Mosetta PigeonHarmeet Singh, MD Annice NeedyJASON S DEW MD ELECTRONICALLY SIGNED 12/11/2011 16:20

## 2014-09-08 NOTE — Discharge Summary (Signed)
PATIENT NAME:  Andrew [name], Andrew [name] MR#:  [id number] DATE OF BIRTH:  September 10, 1950  DATE OF ADMISSION:  12/04/2011 DATE OF DISCHARGE:  12/12/2011  PRIMARY CARE PHYSICIAN:  Evelene CroonMeindert Niemeyer, MD  DISCHARGE DIAGNOSES:  1. Infected right upper extremity AV fistula.  2. Sepsis.  3. Acute respiratory failure. 4. Acute on chronic diastolic congestive heart failure.  5. Acute chronic obstructive pulmonary disease exacerbation.  6. End-stage renal disease, on hemodialysis.  7. Hyperkalemia.  8. Morbid obesity.  9. Hypoalbuminemia.  10. Anemia of chronic kidney disease. 11. Paroxysmal atrial fibrillation   PROCEDURES:  1. AV fistula excision and wound VAC placement, later wound VAC removed.  2. PermCath placed on 12/11/2011.  3. Dialysis three times a week.  4. Right IJ temporary dialysis catheter removed on 12/11/2011.   CONSULTANTS:  1. Nephrology. 2. Vascular Surgery.   ADMITTING HISTORY AND PHYSICAL: Please see detailed history and physical dictated on 12/04/2011. In brief, this is a 64 year old morbidly obese African American male patient with end stage renal disease on hemodialysis through right upper extremity AV fistula who presented to the Emergency Room complaining of pain, redness, and swelling over his right-sided AV fistula. The patient was found to be in sepsis, febrile, and was admitted for possible bacteremia and cellulitis over his AV fistula with sepsis. The patient also had acute respiratory failure secondary to fluid overload at the time of admission.   HOSPITAL COURSE:  1. Right upper extremity cellulitis, AV fistula infection. The patient was started on broad-spectrum vancomycin and Zosyn at the time of admission. Nephrology was called to see for further dialysis needs. As his AV fistula was infected, it could not be used so the patient had a temporary right-sided IJ dialysis catheter placed through which he had his dialysis during the hospital stay. Later because of the  infection the patient's AV fistula was excised and had a wound VAC placed which was later removed on 12/11/2011. The patient has been afebrile during the hospital stay, but four sets of blood cultures over the initial two days are negative to date. On the day of discharge, the patient is afebrile, doing well, and has a wet to dry dressing over his right upper extremity excised AV fistula with minimal pain.  2. Acute respiratory failure. This was secondary to acute chronic obstructive pulmonary disease exacerbation and acute on chronic diastolic congestive heart failure. This improved with the patient being on steroids, nebulizers, and dialysis which decreased his pulmonary edema. On the day of discharge, the patient is saturating well on room air with no wheezing on examination.  3. End-stage renal disease, on hemodialysis. The patient had his dialysis three times a week during the hospital stay, initially through his right IJ temporary dialysis catheter and later a PermCath was placed, on 12/11/2011. The patient will be scheduled with vascular surgery as outpatient to get another AV fistula for permanent access.  4. The patient will be continued on IV vancomycin as discussed with Dr. Thedore MinsSingh for a total of 14 days secondary to high suspicion for bacteremia. The patient will be on IV vancomycin at the dialysis center until 12/18/2011.   DISCHARGE MEDICATIONS:  1. PhosLo gel capsule 667 mg oral capsule three times a day.  2. Colace 100 mg oral two capsules once a day.  3. Flexeril 10 mg oral three times a day as needed.  4. Metamucil as needed once a day.  5. Omeprazole 20 mg oral once a day.  6. Vitamin B3 5000 international units  oral once a day.  7. Lipitor 10 mg oral once a day.  8. Tramadol 50 mg oral every six hours as needed for pain.  9. Vancomycin intravenous with dialysis until 12/18/2011.  10. Prednisone taper over 10 days. 11. DuoNeb 3 mL inhalation four times a day as needed.   DISCHARGE  INSTRUCTIONS: Continue dialysis three times per week and vancomycin with dialysis. The patient will be on a renal diet with activity as tolerated using his walker at all times to prevent any falls. Plan of discharge was discussed with the patient and has verbalized understanding and is okay with the plan.   TIME SPENT: Time spent today on this discharge dictation along with coordinating care and counseling of the patient was 40 minutes.  ____________________________ Molinda Bailiff Joua Bake, MD srs:slb D: 12/12/2011 10:19:49 ET T: 12/12/2011 16:00:22 ET JOB#: [id number]  cc: Wardell Heath R. Shawndra Clute, MD, <Dictator> Meindert A. Lacie Scotts, MD Orie Fisherman MD ELECTRONICALLY SIGNED 12/20/2011 13:49

## 2014-09-08 NOTE — Op Note (Signed)
PATIENT NAME:  Andrew [name], Hicks B MR#:  [id number] DATE OF BIRTH:  [birth year]  DATE OF PROCEDURE:  12/11/2011  PREOPERATIVE DIAGNOSES:  1. End-stage renal disease. 2. Infected AV fistula, status post resection and ligation.  3. Morbid obesity.   POSTOPERATIVE DIAGNOSES:  1. End-stage renal disease. 2. Infected AV fistula, status post resection and ligation.  3. Morbid obesity.   PROCEDURES:    1. Ultrasound guidance for vascular access, left jugular vein.  2. Fluoroscopic guidance for placement of catheter.  3. Placement of a 23 cm tip to cuff tunneled hemodialysis catheter.   SURGEON: Annice NeedyJason S. Dew, MD.  ANESTHESIA: Local with moderate conscious sedation.   ESTIMATED BLOOD LOSS: Approximately 50 mL. FLUOROSCOPY TIME: Approximately two minutes.  CONTRAST USED: None.   INDICATION FOR PROCEDURE:  This is a 64 year old African American male with end-stage renal disease. He had to have his fistula excised for infection last week and needs a PermCath for more permanent dialysis access. Risks and benefits were discussed. Informed consent was obtained.   DESCRIPTION OF PROCEDURE: The patient is brought to the vascular interventional radiology suite. Left neck and chest were sterilely prepped and draped and a sterile surgical field was created. The left jugular vein was visualized with ultrasound and found to be patent. It was then accessed under direct ultrasound guidance without difficulty with a Seldinger needle and permanent image was recorded. J-wire was placed. After measuring with fluoroscopy, we selected a 23 cm tip to cuff tunneled hemodialysis catheter. I sequentially dilated the tract and placed through the peel-away sheath. We had exchanged for an Amplatz extra stiff wire to help facilitate our placement. The peel-away sheath was then placed. The catheter was tunneled from the subclavicular incision to the access site, placed through the peel-away sheath, and the peel-away sheath was  removed. We had to place the super stiff wire to help get the catheter track and it tracked in good location parking the distal tip in the right atrium and the proximal tip in the superior vena cava. The appropriate distal connectors were placed. It was secured to the skin with two Prolene sutures. A 4-0 Monocryl pursestring suture was placed around the exit site and a 4-0 Monocryl was used to close the access site. Sterile dressing was placed. Both lumens withdrew blood well and flushed easily with sterile saline and then a concentrated heparin solution was then placed. The patient tolerated the procedure well and was taken to the recovery room in stable condition.   ____________________________ Annice NeedyJason S. Dew, MD jsd:ap D: 12/11/2011 15:09:19 ET T: 12/11/2011 16:04:20 ET JOB#: [id number]  cc: Annice NeedyJason S. Dew, MD, <Dictator> Annice NeedyJASON S DEW MD ELECTRONICALLY SIGNED 12/13/2011 11:58

## 2014-09-08 NOTE — Consult Note (Signed)
General Aspect infected AV fistula    Present Illness The patient is a 64 year old morbidly obese male with end-stage renal disease on hemodialysis via AV fistula in his right upper extremity.  He presents to the Emergency Room after he was found to have cellulitic changes to his AV fistula. The patient first noted these symptoms since 12/01/2011 and the symptoms have progressively worsened. He does have some serosanguineous discharge from his infected site. No nausea or vomiting, diarrhea or constipation or recent antibiotic use. He has had this AV fistula since 2007 although it started being used in 2011. He has been on dialysis for 18 months. Makes a minimal amount of urine.   PAST MEDICAL HISTORY:    1. Benign prostatic hypertrophy. 2. End stage renal disease on hemodialysis on Monday, Wednesday and Friday.  3. Hypertension.  4. Diastolic congestive heart failure with normal ejection fraction.  5. Cerebral palsy. He walks with a walker.  6. History of sarcoid.  7. Gastroesophageal reflux disease.   Home Medications: Medication Instructions Status  PhosLo Gelcap 667 mg oral capsule 1 cap(s) orally 3 times a day Active  Colace 100 mg oral capsule 2 cap(s) orally once a day, As Needed- for Constipation  Active  calcium acetate 667 mg tablet 2 tab(s) orally 3 times a day before meals (Monday & Wednesday: 8 am, 5 pm, 8 pm, Tuesday, Thursday, Saturday and Sunday: 8 am, 12 pm, 8 pm) Active  Flexeril 10 milligram(s) orally 3 times a day, As Needed for muscle relaxant Active  Metamucil use as directed orally once a day, As Needed- for Constipation  Active  omeprazole 20 mg delayed release capsule 1 cap(s) orally once a day (in the morning) before breakfast (decreases stomach acid) (8 am) **do not crush** Active  Vitamin D3 5000 intl units oral capsule 1 cap(s) orally once a day for supplement (8 am) Active  Lipitor 10 mg oral tablet 1 tab(s) orally once a day (at bedtime) (improves cholesterol)  (8 pm) Active    No Known Allergies:   Case History:   Family History Non-Contributory    Social History negative tobacco, negative ETOH, negative Illicit drugs   Review of Systems:   Fever/Chills Yes    Cough Yes    Sputum No    Abdominal Pain No    Diarrhea No    Constipation No    Nausea/Vomiting No    SOB/DOE Yes    Chest Pain No    Telemetry Reviewed NSR    Dysuria No    Tolerating Diet Yes   Physical Exam:   GEN well developed, well nourished, obese    HEENT pink conjunctivae, hearing intact to voice, moist oral mucosa    NECK supple  trachea midline    RESP normal resp effort  postive use of accessory muscles    CARD regular rate  LE edema present  no JVD    VASCULAR ACCESS AV fistula present  Tenderness around access site  Purulent drainage    ABD denies tenderness  soft  obese    EXTR negative cyanosis/clubbing, positive edema    SKIN normal to palpation, No rashes, positive ulcers    NEURO cranial nerves intact, follows commands, motor/sensory function intact    PSYCH alert, A+O to time, place, person   Nursing/Ancillary Notes: **Vital Signs.:   16-Jul-13 12:41   Vital Signs Type Routine   Temperature Temperature (F) 101.1   Celsius 38.3   Pulse Pulse 110   Respirations  Respirations 24   Systolic BP Systolic BP 751   Diastolic BP (mmHg) Diastolic BP (mmHg) 80   Mean BP 100   Pulse Ox % Pulse Ox % 70   Pulse Ox Activity Level  With exertion; shivering,fever   Oxygen Delivery 5L   Hepatic:  15-Jul-13 12:15    Albumin, Serum  3.2   Bilirubin, Total 0.5   Alkaline Phosphatase 91   SGPT (ALT) 20 (12-78 NOTE: NEW REFERENCE RANGE 04/14/2011)   SGOT (AST) 25   Total Protein, Serum 8.0  16-Jul-13 17:30    Albumin, Serum  3.0  Routine Micro:  15-Jul-13 12:15    Micro Text Report BLOOD CULTURE   COMMENT                   NO GROWTH IN 18-24 HOURS   ANTIBIOTIC                        Micro Text Report BLOOD CULTURE   COMMENT                    NO GROWTH IN 18-24 HOURS   ANTIBIOTIC                        Culture Comment NO GROWTH IN 18-24 HOURS  Result(s) reported on 05 Dec 2011 at 07:35AM.   Culture Comment NO GROWTH IN 18-24 HOURS  Result(s) reported on 05 Dec 2011 at 07:35AM.  Lab:  15-Jul-13 17:00    Lactic Acid, Cardiopulmonary 1.1 (Result(s) reported on 04 Dec 2011 at 05:10PM.)  Cardiology:  15-Jul-13 13:10    Ventricular Rate 90   Atrial Rate 90   P-R Interval 212   QRS Duration 132   QT 370   QTc 452   P Axis 60   R Axis -69   T Axis 43   ECG interpretation Sinus rhythm with 1st degree A-V block Right bundle branch block Left anterior fascicular block *** Bifascicular block *** Septal infarct , age undetermined Abnormal ECG When compared with ECG of 01-May-2011 10:51, Sinus rhythm has replaced Atrial fibrillation Vent. rate has increased BY  33 BPM Nonspecific T wave abnormality has replaced inverted T waves in Inferior leads T wave inversion now evident in Anterior leads ----------unconfirmed---------- Confirmed by OVERREAD, NOT ([id number]), editor PEARSON, BARBARA ([id number]) on 12/05/2011 2:41:09 PM  Routine Chem:  15-Jul-13 12:15    Glucose, Serum  105   BUN  86   Creatinine (comp)  12.94   Sodium, Serum 141   Potassium, Serum  5.4   Chloride, Serum 102   CO2, Serum 29   Calcium (Total), Serum 9.4   Phosphorus, Serum  1.2 (Result(s) reported on 04 Dec 2011 at 01:20PM.)   Anion Gap 10   Osmolality (calc) 308   eGFR (African American)  4   eGFR (Non-African American)  4 (eGFR values <46m/min/1.73 m2 may be an indication of chronic kidney disease (CKD). Calculated eGFR is useful in patients with stable renal function. The eGFR calculation will not be reliable in acutely ill patients when serum creatinine is changing rapidly. It is not useful in  patients on dialysis. The eGFR calculation may not be applicable to patients at the low and high extremes of body sizes, pregnant women, and  vegetarians.)   Magnesium, Serum 1.8 (1.8-2.4 THERAPEUTIC RANGE: 4-7 mg/dL TOXIC: > 10 mg/dL  -----------------------)  16-Jul-13 04:11    Glucose, Serum  102   BUN  98   Creatinine (comp)  15.32   Sodium, Serum 143   Potassium, Serum  5.4   Chloride, Serum 101   CO2, Serum 28   Calcium (Total), Serum 9.0   Anion Gap 14   Osmolality (calc) 316   eGFR (African American)  3   eGFR (Non-African American)  3 (eGFR values <37m/min/1.73 m2 may be an indication of chronic kidney disease (CKD). Calculated eGFR is useful in patients with stable renal function. The eGFR calculation will not be reliable in acutely ill patients when serum creatinine is changing rapidly. It is not useful in  patients on dialysis. The eGFR calculation may not be applicable to patients at the low and high extremes of body sizes, pregnant women, and vegetarians.)    17:30    Glucose, Serum  188   BUN  106   Creatinine (comp)  16.18   Sodium, Serum 138   Potassium, Serum  5.7   Chloride, Serum 98   CO2, Serum 26   Calcium (Total), Serum 9.0   Phosphorus, Serum 2.9   Anion Gap 14   Osmolality (calc) 314   eGFR (African American)  3   eGFR (Non-African American)  3 (eGFR values <677mmin/1.73 m2 may be an indication of chronic kidney disease (CKD). Calculated eGFR is useful in patients with stable renal function. The eGFR calculation will not be reliable in acutely ill patients when serum creatinine is changing rapidly. It is not useful in  patients on dialysis. The eGFR calculation may not be applicable to patients at the low and high extremes of body sizes, pregnant women, and vegetarians.)  Routine Coag:  15-Jul-13 12:15    Activated PTT (APTT)  37.4 (A HCT value >55% may artifactually increase the APTT. In one study, the increase was an average of 19%. Reference: "Effect on Routine and Special Coagulation Testing Values of Citrate Anticoagulant Adjustment in Patients with High HCT  Values." American Journal of Clinical Pathology 2006;126:400-405.)   Prothrombin 14.1   INR 1.1 (INR reference interval applies to patients on anticoagulant therapy. A single INR therapeutic range for coumarins is not optimal for all indications; however, the suggested range for most indications is 2.0 - 3.0. Exceptions to the INR Reference Range may include: Prosthetic heart valves, acute myocardial infarction, prevention of myocardial infarction, and combinations of aspirin and anticoagulant. The need for a higher or lower target INR must be assessed individually. Reference: The Pharmacology and Management of the Vitamin K  antagonists: the seventh ACCP Conference on Antithrombotic and Thrombolytic Therapy. ChGDJME.2683ept:126 (3suppl): [id number] HCT value >55% may artifactually increase the PT.  In one study,  the increase was an average of 25%. Reference:  "Effect on Routine and Special Coagulation Testing Values of Citrate Anticoagulant Adjustment in Patients with High HCT Values." American Journal of Clinical Pathology 2006;126:400-405.)  Routine Hem:  15-Jul-13 12:15    WBC (CBC) 8.4   RBC (CBC)  3.19   Hemoglobin (CBC)  10.3   Hematocrit (CBC)  30.2   Platelet Count (CBC)  123   MCV 95   MCH 32.3   MCHC 34.0   RDW 13.8   Neutrophil % 75.1   Lymphocyte % 9.9   Monocyte % 14.6   Eosinophil % 0.1   Basophil % 0.3   Neutrophil # 6.3   Lymphocyte #  0.8   Monocyte #  1.2   Eosinophil # 0.0   Basophil # 0.0 (Result(s) reported on 04 Dec 2011 at 01:11PM.)  16-Jul-13 04:11    WBC (CBC) 9.8   RBC (CBC)  3.10   Hemoglobin (CBC)  9.8   Hematocrit (CBC)  29.7   Platelet Count (CBC)  105   MCV 96   MCH 31.5   MCHC 32.9   RDW 13.9   Neutrophil % 75.9   Lymphocyte % 10.1   Monocyte % 13.6   Eosinophil % 0.2   Basophil % 0.2   Neutrophil #  7.5   Lymphocyte # 1.0   Monocyte #  1.3   Eosinophil # 0.0   Basophil # 0.0 (Result(s) reported on 05 Dec 2011 at [facility].)     17:30    WBC (CBC)  11.8   RBC (CBC)  3.22   Hemoglobin (CBC)  9.9   Hematocrit (CBC)  31.0   Platelet Count (CBC)  116   MCV 96   MCH 30.8   MCHC 32.1   RDW 14.2   Neutrophil % 92.4   Lymphocyte % 2.8   Monocyte % 4.6   Eosinophil % 0.0   Basophil % 0.2   Neutrophil #  10.9   Lymphocyte #  0.3   Monocyte # 0.5   Eosinophil # 0.0   Basophil # 0.0 (Result(s) reported on 05 Dec 2011 at 06:16PM.)     Impression 1. Right upper extremity cellulitis over the AV fistula with sepsis.                    continue Zosyn and vancomycin to                    send for blood cultures along with check lactic acid.                     the patient does seem to be fluid overloaded therefore temp cath will be placed                    Nephrology on consult  2. Hyperkalemia secondary to end stage renal disease.                      treat with dialysis.  3. Anemia of chronic kidney disease,                      stable.                     serial H/H                     epogen per Nephrology  4. End stage renal disease on hemodialysis.                      Nephrology consulted.    Plan Level 3 consult   Electronic Signatures: Hortencia Pilar (MD)  (Signed 16-Jul-13 20:52)  Authored: General Aspect/Present Illness, Home Medications, Allergies, History and Physical Exam, Vital Signs, Labs, Impression/Plan   Last Updated: 16-Jul-13 20:52 by Hortencia Pilar (MD)

## 2014-09-08 NOTE — H&P (Signed)
PATIENT NAME:  Andrew [name], Andrew [name] MR#:  [id number] DATE OF BIRTH:  1950-06-03  DATE OF ADMISSION:  12/04/2011  PRIMARY CARE PHYSICIAN:  Dr. Lacie ScottsNiemeyer.   CHIEF COMPLAINT: Chills, fatigue and AV fistula in the right arm, redness and pain.   HISTORY OF PRESENT ILLNESS: 64 year old morbidly obese African American male patient with end-stage renal disease on hemodialysis through AV fistula in his right upper extremity who presents to the Emergency Room sent in from dialysis center after he was found to have cellulitis, redness and tenderness right over his AV fistula site. The patient has had these symptoms since 12/01/2011 and has progressively worsened. He also complains of mild shortness of breath. No cough. He does have some serosanguineous discharge from his infected site. No nausea or vomiting, diarrhea or constipation or recent antibiotic use. He has had this AV fistula since 2007 although it started being used in 2011. He has been on dialysis for 18 months. Makes a minimal amount of urine. He lives at an assisted living facility.   The patient has been ordered with vancomycin and Zosyn in the Emergency Room. Dr. Thedore MinsSingh has been called from nephrology who will see the patient.   PAST MEDICAL HISTORY:    1. Benign prostatic hypertrophy. 2. End stage renal disease on hemodialysis on Monday, Wednesday and Friday.  3. Hypertension.  4. Diastolic congestive heart failure with normal ejection fraction.  5. Cerebral palsy. He walks with a walker.  6. History of sarcoid.  7. Gastroesophageal reflux disease.   SOCIAL HISTORY: He does not smoke, drink alcohol or use illicit drugs. Lives in an assisted living facility/group home.   ALLERGIES: No known drug allergies.   PAST SURGICAL HISTORY:  AV fistula placed in 2007. He supposedly had his tendon stent in his lower legs for six years prior.   HOME MEDICATIONS:  1. Calcium acetate 667 mg 3 tablets orally 3 times a day.  2. Colace 100 mg 2 capsules  oral once a day.  3. Flexeril 5 mg oral 2 times a day as needed.  4. Lipitor 10 mg oral once a day.  5. Omeprazole 1 capsule oral once a day.  6. PhosLo 667 mg 1 capsule oral 3 times a day.  7. Vitamin B3 5,000 international units 1 capsule oral once a day.   REVIEW OF SYSTEMS: Constitutional, eyes, ENT, respirator, cardiovascular, gastrointestinal, genitourinary, endocrine, hematologic, lymphatic, skin, musculoskeletal, neurological, psychiatry reviewed and negative except the ones mentioned in history of present illness.   PHYSICAL EXAMINATION:   VITAL SIGNS: Temperature 103.1, pulse of 111, respirations 22, blood pressure 144/74, saturating 86% on room air and 96% on 2 liters oxygen.   GENERAL: Morbidly obese African American male patient lying in bed in mild respiratory distress secondary to pain in his right upper extremity. He is alert, sitting up in bed.   PSYCHIATRIC: Alert and oriented x3. Mood and affect appropriate. Judgment intact.   HEENT: Atraumatic, normocephalic. Oral mucosa moist and pink. External ears and nose normal. Pupils bilaterally equal and reactive to light. Pallor positive. No icterus. Oropharynx clear.   NECK: Supple. No thyromegaly. No palpable lymph nodes. Trachea midline. No carotid bruit or JVD.   CARDIOVASCULAR: S1, S2, regular rate and rhythm without any murmurs. Peripheral pulses 2+. Has 1+ edema in the lower extremities.   RESPIRATORY: Increased work of breathing. Clear to auscultation on both sides with decreased air entry at the bases.   ABDOMEN: Soft abdomen, nontender. Bowel sounds present. No hepatosplenomegaly palpable. Distended  secondary to obesity.   GENITOURINARY: No CVA tenderness or suprapubic tenderness.   SKIN: Has tenderness, erythema and induration about 10/10 cm around the AV fistula on the right upper extremity.   EXTREMITIES: No cyanosis or clubbing,   MUSCULOSKELETAL: No joint swelling, redness, or effusion of the large joints.  Normal muscle tone.   NEUROLOGICAL: Cranial nerves II-XII grossly intact. Strength 4/5 bilaterally and symmetrical.   LABORATORY, RADIOLOGICAL AND DIAGNOSTIC DATA: Glucose of 105, BUN 86, creatinine 12.94. Sodium 141, potassium 5.4, anion gap 10, magnesium 1.8, albumin 3.2. WBC 8.4, hemoglobin 10.3, platelets 123, PT, APTT normal. Chest x-ray shows some mild fluid overload. EKG shows sinus rhythm with first degree AV block with PR of 212. Left anterior fascicular block and right bundle branch block. No changes from prior EKG.   ASSESSMENT AND PLAN:  1. Right upper extremity cellulitis over the AV fistula with sepsis. We will start the patient on Zosyn and vancomycin to broaden coverage high suspicion for bacteremia in this patient with cellulitis right over his AV graft. We will send for blood cultures along with check lactic acid. The patient does seem to be mildly fluid overloaded on his chest x-ray. We will not start him on any IV fluids. We will consult Dr. Thedore Mins, nephrology, for further input with fluid management and dialysis. The patient is febrile of 103 and tachycardic at 111. There is no fluctuant swelling. I do not see any need for any incision and drainage at this time. No abscess suspected. We will closely monitor and follow. The patient will be on tele floor.  2. Hyperkalemia secondary to end stage renal disease. Should resolve with dialysis. The patient did not get dialysis today, might need one. We will await Dr. Doristine Church input.  3. Anemia of chronic kidney disease, stable.  4. End stage renal disease on hemodialysis. Nephrology consulted.  5. Acute on chronic diastolic congestive heart failure. The patient does have shortness of breath and some fluid on his chest x-ray. This is likely secondary to missing his dialysis and the ongoing infection. The patient does not make much urine. Will await his dialysis.  6. Deep vein thrombosis prophylaxis with heparin.     TIME SPENT TODAY IN  COORDINATION OF CARE: 46 minutes.    ____________________________ Molinda Bailiff Annis Lagoy, MD srs:ap D: 12/04/2011 14:43:37 ET T: 12/04/2011 15:38:41 ET JOB#: [id number]  cc: Wardell Heath R. Shonika Kolasinski, MD, <Dictator> Meindert A. Lacie Scotts, MD Orie Fisherman MD ELECTRONICALLY SIGNED 12/08/2011 7:58

## 2014-09-11 NOTE — Op Note (Signed)
PATIENT NAME:  Andrew [name], Andrew [name] MR#:  [id number] DATE OF BIRTH:  Oct 17, 1950  DATE OF PROCEDURE:  06/06/2012  PREOPERATIVE DIAGNOSES: 1.  End-stage renal disease.  2.  Poorly functioning left brachiocephalic arteriovenous fistula.  3.  Morbid obesity.  4.  Hypertension.   POSTOPERATIVE DIAGNOSIS: 1.  End-stage renal disease.  2.  Poorly functioning left brachiocephalic arteriovenous fistula.  3.  Morbid obesity.  4.  Hypertension.   PROCEDURES: 1.  Ultrasound guidance for vascular access to left brachiocephalic AV fistula.  2.  Left upper extremity fistulogram.  3. Percutaneous transluminal angioplasty with 4 mm and 5 mm diameter angioplasty balloon to perianastomotic stenosis.   SURGEON:  Annice NeedyJason S. Dew, M.D.   ANESTHESIA:  Local with moderate conscious sedation.   ESTIMATED BLOOD LOSS:  Approximately 25 mL.   INDICATION FOR PROCEDURE:  This is a 64 year old male with end-stage renal disease. His left arm AV fistula has not matured and become a reliable dialysis access. He is still catheter dependent. A fistulogram is performed for further evaluation and potential treatment. Risks and benefits were discussed. Informed consent was obtained.   DESCRIPTION OF PROCEDURE: The patient was brought to the vascular radiology suite. The left upper extremity was sterilely prepped and draped. A sterile surgical field was created. The fistula was initially accessed about 8 cm beyond the anastomosis in an antegrade fashion with a micropuncture needle, micropuncture wire and sheath were then placed. The vein on ultrasound was quite small and did not have a typical fistula appearance, which would go along with the difficulties with access.  The vein was accessed with the micropuncture needle, and a wire was placed and permanent images were recorded. Imaging was performed through the micropuncture sheath. This showed some sluggish flow around the catheter, but no focal stenosis from the midportion of the  upper arm to the confluence to the deep venous system. With compression of the fistula, I was able to evaluate the proximal portion of the fistula near the anastomosis. This showed a critical string sign stenosis that appeared hyperplastic, likely from intimal hyperplasia within the vein. This was nearly occlusive and clearly limiting maturation flow in the fistula. At this point, we are not going to be able to treat this from our current direction. I removed that sheath, and then accessed the fistula in a retrograde fashion well up the arm. I was able to cross the lesion without difficulty with a Magic Torque wire. I treated this lesion with a 4 mm diameter angioplasty balloon, and then a 5 mm diameter angioplasty balloon. Following this, there was markedly improved flow with mild residual stenosis of less than 20% to 25%. Due to the very significant tight stenosis, I was leery to balloon any higher than 5 mm today and elected to terminate the procedure. The sheath was removed around a 4-0 Monocryl pursestring suture. Pressure was held. Sterile dressing was placed. The patient tolerated the procedure well and was taken to the recovery room in stable condition.   ____________________________ Annice NeedyJason S. Dew, MD jsd:dm D: 06/06/2012 09:14:17 ET T: 06/06/2012 09:56:05 ET JOB#: [id number]  cc: Annice NeedyJason S. Dew, MD, <Dictator> Annice NeedyJASON S DEW MD ELECTRONICALLY SIGNED 06/10/2012 8:31

## 2014-09-11 NOTE — Discharge Summary (Signed)
PATIENT NAME:  Andrew [name], Andrew [name] MR#:  [id number] DATE OF BIRTH:  [birth year]  DATE OF ADMISSION:  02/10/2013 DATE OF DISCHARGE:  02/20/2013   ADMITTING PHYSICIAN: Srikar R. Sudini, MD  DISCHARGING PHYSICIAN: Enid Baasadhika Dickie Labarre, MD  PRIMARY PHYSICIAN: Serita Shellerrnest [name]. Maryellen PileEason, MD  PRIMARY NEPHROLOGIST: Molli Barrowsaven Voora, MD, of [facility].   CONSULTATIONS IN THE HOSPITAL:  1. Nephrology consultation by Dr. Thedore MinsSingh and Dr. Cherylann RatelLateef.  2. Cardiology consultation by Dr. Julien Nordmannimothy Gollan.   DISCHARGE DIAGNOSES:  1. Sepsis.  2. Pneumonia.  3. End-stage renal disease, on hemodialysis on Monday, Wednesday, Friday schedule.  4. Obstructive sleep apnea, not on any CPAP and on nocturnal oxygen.  5. Acute respiratory failure secondary to pneumonia.  6. Hypotension secondary to sepsis and pneumonia.  7. Paroxysmal atrial fibrillation with slow ventricular rate in the hospital.  8. Mobitz type I heart block.  9. Bladder spasms.  10. Acute on chronic diastolic congestive heart failure exacerbation.  11. Sarcoidosis. 12. Morbid obesity.  13. Cerebral palsy and walks with walker at baseline.  DISCHARGE MEDICATIONS:  1. Atorvastatin 10 mg p.o. daily.  2. PhosLo 3 capsules 3 times a day 667 mg.  3. Loratadine 10 mg p.o. daily.  4. Tylenol 650 mg twice a day for pain management.  5. Omeprazole 20 mg p.o. daily.  6. Norco 5/325 mg 1 tablet q.4 hours p.r.n. for pain.  7. Aspirin 81 mg p.o. daily.  8. Sudafed 120 mg p.o. [name].i.d.  9. Senna/Colace 8.6/50 mg p.o. [name].i.d.  10. MiraLax 17 grams powder twice a day p.r.n. for constipation.  11. Midodrine 15 mg p.o. 3 times a day.  12. Oxybutynin 5 mg p.o. [name].i.d.   DISCHARGE HOME OXYGEN: 2 liters nocturnal while sleeping.   DISCHARGE DIET: Renal diet.   DISCHARGE ACTIVITY: As tolerated.  FOLLOWUP INSTRUCTIONS:  [facility]. [facility].  2. PCP followup in 1 to 2 weeks.   LABORATORY AND IMAGING STUDIES PRIOR TO DISCHARGE:   Sodium 136, potassium 4.3, chloride 99, bicarbonate 33, BUN 42, creatinine 7.9, glucose 134 and calcium of 9.0.  WBC 7.5, hemoglobin 9.2, hematocrit 26.8, platelet count is 149.  Chest x-ray showing CHF, pulmonary interstitial edema findings.  Left leg Doppler ultrasound done to rule out any DVT was negative. It was done for swelling noted, without erythema or tenderness.   BRIEF HOSPITAL COURSE: Mr. Andrew [name] is a 64 year old obese Caucasian male with past medical history significant for end-stage renal disease, on Monday, Wednesday, Friday hemodialysis, diastolic CHF, history of cerebral palsy, walks with a walker, hypertension, sarcoidosis, who comes to the hospital from [facility] secondary to hypotension and bradycardia during dialysis. He was also noted to have a potassium of 7.2 in the ER.   1. Hypotension, likely secondary to sepsis from pneumonia. Chest x-ray showing mostly pulmonary edema; however, atelectasis, pneumonia cannot be ruled out. He was on vancomycin, Zosyn and Levaquin in the hospital. Vancomycin was discontinued once blood cultures were negative, and he finished 7-day course of Levaquin and Zosyn. He was requiring up to 2 to 3 liters of oxygen continuously even at rest; however, has been weaned off to room air. His antibiotics are stopped now. He can only use nocturnal O2 for his obstructive sleep apnea since he cannot tolerate the CPAP while sleeping.  2. Bradycardia with new diagnosis of atrial fibrillation. The patient was noted to be in Mobitz type I heart block as well. This was likely secondary to hyperkalemia, potassium  was 7.2. He was dialyzed urgently and was also seen by cardiology. His heart rate has remained stable from 60 to 80 range. Since his blood pressure was low and because of this heart block and bradycardia, his metoprolol was being stopped at the time of discharge. 3. Persistent hypotension, initially thought to be from sepsis, and then per  nephrology, his blood pressure usually runs in the 90s systolic even during dialysis also. He did respond well to Sudafed and midodrine, which are being continued at the time of discharge.  4. Bladder spasms. Oxybutynin was started at low dose and can be increased as needed.  5. Acute on chronic diastolic congestive heart failure exacerbation, pulmonary edema with fluid overload. Was dialyzed appropriately, and his breathing was better. The patient makes only minimal urine.  His course has been otherwise uneventful in the hospital.   DISCHARGE CONDITION: Stable.   DISCHARGE DISPOSITION: To [facility].   TIME SPENT ON DISCHARGE: 45 minutes.   CODE STATUS: Full code.   ____________________________ Enid Baas, MD rk:OSi D: 02/20/2013 11:22:41 ET T: 02/20/2013 11:34:55 ET JOB#: [id number]  cc: Enid Baas, MD, <Dictator> Serita Sheller. Maryellen Pile, MD Enid Baas MD ELECTRONICALLY SIGNED 03/08/2013 11:55

## 2014-09-11 NOTE — Op Note (Signed)
PATIENT NAME:  [name], Mikhail B MR#:  [id number] DATE OF BIRTH:  08-25-1950  DATE OF PROCEDURE:  12/19/2012  PREOPERATIVE DIAGNOSES:  1. End-stage renal disease.  2. Poorly functioning left arm arteriovenous fistula with inability to successfully cannulate reliably.  3. Morbid obesity.  4. Chronic obstructive pulmonary disease.  5. Congestive heart failure.  6. Hyperlipidemia.  7. Sarcoidosis.   POSTOPERATIVE DIAGNOSES:  1. End-stage renal disease.  2. Poorly functioning left arm arteriovenous fistula with inability to successfully cannulate reliably.  3. Morbid obesity.  4. Chronic obstructive pulmonary disease.  5. Congestive heart failure.  6. Hyperlipidemia.  7. Sarcoidosis.   PROCEDURE: 1. Revision/superficialization of left brachiocephalic arteriovenous fistula.   SURGEON: Annice NeedyJason S. Cianna Kasparian, MD  ANESTHESIA: General.   ESTIMATED BLOOD LOSS: Minimal.   INDICATION FOR PROCEDURE: A 64 year old African-American male with end-stage renal disease. He had a left arm AV fistula. This was able to be used intermittently for his dialysis over several months, but given his large size, the deep nature of the fistula and the fistula not being a particularly large and robust fistula, reliable access has not been maintained, and we are asked to revise this. Risks and benefits were discussed. Informed consent was obtained.   DESCRIPTION OF PROCEDURE: The patient is brought to the operative suite, and after an adequate level of general anesthesia is obtained, the left upper extremity was sterilely prepped and draped, and a sterile surgical field was created. An incision was created overlying the cephalic vein portion of the fistula over about half to two-thirds of the upper arm from the antecubital fossa proximally. The fistula was dissected out. There were 3 vein branches that were ligated and divided to help facilitate exposure. One was a reasonable size branch that may have been limiting flow  somewhat. In addition, there was a dense cicatrix of connective tissue around significant portions of the vein. This was lysed surgically with tenotomy scissors, and this did help free the vein and enlarge it slightly. The vein was dissected out over an appropriate length, and I then closed the subcutaneous tissues under it with interrupted 3-0 Vicryl sutures to bring the fistula directly under the skin. The skin and immediate subcutaneous tissues were closed with a 3-0 Vicryl and a 4-0 Monocryl immediately over the fistula, and Dermabond was placed as a dressing. The patient tolerated the procedure well and was taken to the recovery room in stable condition.   ____________________________ Annice NeedyJason S. Kaida Games, MD jsd:OSi D: 12/19/2012 11:15:21 ET T: 12/19/2012 11:36:07 ET JOB#: [id number]  cc: Annice NeedyJason S. Dushaun Okey, MD, <Dictator> Laverda SorensonSarath C. Kolluru, MD Meindert A. Lacie ScottsNiemeyer, MD Annice NeedyJASON S [place] Madding MD ELECTRONICALLY SIGNED 12/26/2012 8:16

## 2014-09-11 NOTE — H&P (Signed)
PATIENT NAME:  Andrew [name], Andrew [name] MR#:  [id number] DATE OF BIRTH:  [name]  DATE OF ADMISSION:  02/10/2013  PRIMARY CARE PHYSICIAN: Andrew [name]. Andrew PileEason, MD  PRIMARY NEPHROLOGIST: Andrew Barrowsaven Voora, MD, of nephrology.   CHIEF COMPLAINT: Shortness of breath, bradycardia.   HISTORY OF PRESENT ILLNESS:  [name] with history of end-stage renal disease on hemodialysis on Monday, Wednesday, Friday; benign prostatic hypertrophy, diastolic congestive heart failure, cerebral palsy, hypertension; was at the dialysis center for his routine dialysis on Monday. He has been noticing some shortness of breath since yesterday night, nothing concerning for him so he went for his routine dialysis but was found to have low blood pressures with systolic in the 60s along with bradycardia into the 40s and sent to the Emergency Room. Here his blood pressures have been over 100 but bradycardia in the 40s with sinus pauses, atrial fibrillation on EKG. His potassium has returned high at 7.2 and the patient is being admitted to the hospitalist service. He will need stat dialysis.   The patient does mention that he does tend to feel short of breath on Mondays waiting for his dialysis, nothing unusual. He does have some orthopnea, no lower extremity swelling. His last admission was in April of 2014 with acute on chronic diastolic CHF secondary to dietary noncompliance. The patient is also on metoprolol 25 mg every day.   PAST MEDICAL HISTORY 1.  Diastolic CHF.   2.  End-stage renal disease, on hemodialysis on Monday, Wednesday, Friday.  3.  Hypertension.  4.  Cerebral palsy and walks with a walker.  5.  Sarcoidosis.  6.  GERD.   7.  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  8.  Benign prostatic hypertrophy.  9.  Hyperkalemia.   SOCIAL HISTORY: The patient does not smoke. No alcohol. No illicit drugs. Used to live in a group home but is in a nursing home at this time. He sees Dr. Maryellen [name] there.   CODE STATUS:  Full code.   FAMILY HISTORY: His mother had kidney problems and was on dialysis. His father had heart disease but he is not sure what kind.   ALLERGIES: No known drug allergies.   REVIEW OF SYSTEMS CONSTITUTIONAL: No fever. Has some fatigue.  EYES: No blurred vision, pain or redness.  ENT: No tinnitus, ear pain, hearing loss.  RESPIRATORY: No cough, wheeze, hemoptysis. Has shortness of breath.  CARDIOVASCULAR: No chest pain, has some orthopnea.  GASTROINTESTINAL: No nausea, vomiting, diarrhea, abdominal pain.  GENITOURINARY: No dysuria, hematuria, frequency. Does not make any urine. He is on dialysis.  ENDOCRINE: No polyuria, nocturia, thyroid problems.  HEMATOLOGIC AND LYMPHATIC: Has anemia of chronic disease.  INTEGUMENTARY: No acne, rash, lesions.  MUSCULOSKELETAL: Has weakness in lower extremities secondary to cerebral palsy.  NEUROLOGIC: No seizures.  PSYCHIATRIC:  No anxiety or depression.   HOME MEDICATIONS: Include 1.  Acetaminophen/hydrocodone 325/5, 1 tablet oral every 4 hours as needed.  2.  Atorvastatin 10 mg daily.  3.  Calcium acetate 667 mg, 3 capsules oral 3 times a day.  4.  Loratadine 10 mg oral every other day.  5.  Tylenol 650 mg oral 2 times a day as needed.  6.  Metoprolol succinate 25 mg extended-release orally once a day.  7.  MiraLax once a day as needed.  8.  Omeprazole 20 mg daily.   PHYSICAL EXAMINATION VITAL SIGNS: Temperature 98, pulse of 42, blood pressure 109/53, saturating 95% on 2 liters oxygen.  GENERAL: Morbidly obese African American male patient lying in bed in no acute distress.  PSYCHIATRIC: Alert and oriented x 3. Mood and affect appropriate. Judgment intact.  HEENT: Atraumatic, normocephalic. Oral mucosa moist and pink. Pallor positive. No icterus.  NECK: Supple. No thyromegaly. No palpable lymph nodes. Trachea midline. No JVD.  CARDIOVASCULAR: S1, S2, without any murmurs. Peripheral pulses 2+. No edema.  RESPIRATORY: Has bilateral basal  crackles, good air entry.  GASTROINTESTINAL: Soft abdomen, nontender. Bowel sounds present. No visceromegaly  palpable.  NEUROLOGICAL: Motor strength is about 4/5 in lower extremities, 5/5 in upper extremities. Sensation to fine touch intact all over.   LAB STUDIES: Show glucose 100, BUN 109, creatinine 13.75, sodium 132, potassium 7.2, chloride 100, bicarbonate 20. CK of 69, troponin less than 0.02. WBC 7.8, hemoglobin 11.3, platelets of 171.   EKG shows atrial fibrillation with bradycardia of 52, ST elevation.   Chest x-ray shows pulmonary edema, small bilateral pleural effusions.   ASSESSMENT AND PLAN 1.  Atrial fibrillation with bradycardia secondary to severe hyperkalemia of 7.2. There is a new diagnosis of atrial fibrillation for the patient. I suspect this is due to his hyperkalemia. I discussed with Andrew [name] of nephrology. The patient will be getting stat dialysis. Prior to this will give him insulin, D50, bicarbonate, calcium and Kayexalate in the meantime. We will start the patient on aspirin. I do not think we need to start him on any anticoagulation at this time. Will monitor for any atrial fibrillation after dialysis. No history of atrial fibrillation. Will check 2 more sets of cardiac enzymes. The patient is critically ill with high risk for cardiac arrest and death. Will be admitted to Critical Care Unit. We will hold the metoprolol he is on. He does have history of hypothyroidism, will check TSH level.  2.  Hypertension. The patient has been hypotensive, we will hold the metoprolol.  3.  Anemia of chronic disease, stable.  4.  End-stage renal disease, on hemodialysis. Consulted Andrew [name], who will see the patient.  5.  Deep venous thrombosis prophylaxis with heparin.  6.  Acute on chronic diastolic congestive heart failure, should resolve with hemodialysis. We will put him on fluid restriction.  7.  CODE STATUS: Full code.   TIME SPENT: Today on this critically ill patient was 65  minutes.   ____________________________ Molinda Bailiff Bethany Hirt, MD srs:cs D: 02/10/2013 14:49:07 ET T: 02/10/2013 15:19:03 ET JOB#: [id number]  cc: Wardell Heath R. Osman Calzadilla, MD, <Dictator> Serita Sheller. Andrew Pile, MD Andrew Barrows, MD Dr. Elson Areas MD ELECTRONICALLY SIGNED 02/10/2013 16:45

## 2014-09-11 NOTE — Op Note (Signed)
PATIENT NAME:  Andrew [name], Andrew [name] MR#:  [id number] DATE OF BIRTH:  1950-09-05  DATE OF PROCEDURE:  03/11/2013  PREOPERATIVE DIAGNOSIS: End-stage renal disease with functional permanent dialysis access and no longer needing PermCath.   POSTOPERATIVE DIAGNOSIS: End-stage renal disease with functional permanent dialysis access and no longer needing PermCath.   PROCEDURE PERFORMED: Removal of left jugular PermCath.   SURGEON: [place] Kiauna Zywicki, [address]   ANESTHESIA: Local.   ESTIMATED BLOOD LOSS: Minimal.   INDICATION FOR THE PROCEDURE: The patient is a 64 year old African American male with end-stage renal disease. His access is functional and he no longer needs PermCath, therefore this will be removed.   DESCRIPTION OF THE PROCEDURE: The patient is brought to the vascular interventional radiology area and positioned supine. The left neck and chest and existing catheter were sterilely prepped and draped and a sterile field was created. The area was locally anesthetized copiously with 1% lidocaine. Hemostats were used to help dissect out the cuff. An 11 blade was used to transect the fibrous sheath connected to the cuff. The catheter was then removed in its entirety without difficulty with gentle traction. Pressure was held at the base of the neck. Sterile dressing was placed. The patient tolerated the procedure well.    ____________________________ Hoyle Sauerhelsea N. Twisha Vanpelt, PA-C cnh:dp D: 03/11/2013 09:02:05 ET T: 03/11/2013 10:13:35 ET JOB#: [id number]  cc: Hoyle Sauerhelsea N. Shandee Jergens, PA-C, <Dictator> Charlean MerlCHELSEA N Maram Bently PA ELECTRONICALLY SIGNED 03/12/2013 16:33

## 2014-09-11 NOTE — Op Note (Signed)
PATIENT NAME:  Andrew [name], Andrew [name] MR#:  [id number] DATE OF BIRTH:  03-20-1951  DATE OF PROCEDURE:  02/10/2013  PREOPERATIVE DIAGNOSES:  1.  Hypotension.  2.  End-stage renal disease.  3.  Lack of appropriate IV access.   POSTOPERATIVE DIAGNOSES:  1.  Hypotension. 2.  End-stage renal disease.  3.  Lack of appropriate IV access.   PROCEDURE PERFORMED: Insertion of right femoral triple lumen catheter with ultrasound guidance.   SURGEON: Levora DredgeGregory Schnier, MD   PROCEDURE: The patient is in the intensive care unit and positioned supine. His right groin is prepped and draped in a sterile fashion. Ultrasound is placed in a sterile sleeve. Femoral vein is identified with ultrasound. It is echolucent and compressible, indicating patency. Image is recorded for the permanent record. Under direct ultrasound visualization after 1% lidocaine, the Seldinger needle is inserted into the vein. J-wire is advanced, a counterincision made with an 11 blade scalpel. Dilator is passed over the wire, and the triple-lumen catheter is fed. All 3 lumens aspirate and flush easily. The catheter is secured to the skin of the thigh with 2-0 silk, and a sterile dressing, including a Biopatch, is applied. There are no immediate complications.   ____________________________ Renford DillsGregory G. Schnier, MD ggs:cb D: 02/10/2013 23:02:37 ET T: 02/10/2013 23:06:54 ET JOB#: [id number]  cc: Renford DillsGregory G. Schnier, MD, <Dictator> Renford DillsGREGORY G SCHNIER MD ELECTRONICALLY SIGNED 02/21/2013 10:03

## 2014-09-11 NOTE — Consult Note (Signed)
Brief Consult Note: Diagnosis: Cystitis, Bladder spasms.   Patient was seen by consultant.   Consult note dictated.   Recommend further assessment or treatment.   Discussed with Attending MD.   Comments: 64 yo with recurrent cysitis/bladder spasms, ESRD on HD with minimal UOP, no GU intervention needed at this time.  -continue IV abx -consider urinary suppressive antibiotics for 60-90 days post current treatment (TMP daily or Cipro 250mg  3 x weekly) - consider B&O suppositories for sever bladder spasms  #[id number] -outpatient f/u with local urology.  Electronic Signatures: Charyl BiggerHouser, Kharee Lesesne Ross (MD)  (Signed [phone]-Nov-14 21:49)  Authored: Brief Consult Note   Last Updated: 01-Nov-14 21:49 by Charyl BiggerHouser, Steel Kerney Ross (MD)

## 2014-09-11 NOTE — Op Note (Signed)
PATIENT NAME:  Andrew [name], Andrew [name] MR#:  [id number] DATE OF BIRTH:  15-May-1951  DATE OF PROCEDURE:  07/25/2012  PREOPERATIVE DIAGNOSES:  1.  End-stage renal disease.  2.  Poorly functioning left arm arteriovenous fistula.  3.  Morbid obesity.  4.  Congestive heart failure.   POSTOPERATIVE DIAGNOSES:  1.  End-stage renal disease.  2.  Poorly functioning left arm arteriovenous fistula.  3.  Morbid obesity.  4.  Congestive heart failure.   PROCEDURE:   1.  Ultrasound guidance for vascular access, left brachiocephalic AV fistula.  2.  Left upper extremity fistulogram and central venogram.  3.  Percutaneous transluminal angioplasty of perianastomotic stenosis with 7 mm diameter angioplasty balloon.  4.  Percutaneous transluminal angioplasty of mid upper arm cephalic vein stenosis with 7 mm diameter angioplasty balloon.   SURGEON: Annice NeedyJason S. Dew, M.D.   ANESTHESIA: Local with moderate conscious sedation.   ESTIMATED BLOOD LOSS: 25 mL.   FLUOROSCOPY TIME: Approximately 3-1/2 minutes.   CONTRAST USED: 26 mL Visipaque.   INDICATION FOR PROCEDURE: This is a 64 year old African American male with end-stage renal disease. He is morbidly obese. His left brachiocephalic AV fistula has not been reliably usable for dialysis. A noninvasive study has shown stenosis near the anastomosis and a fistulogram is performed for further evaluation. Risks and benefits were discussed. Informed consent was obtained.   DESCRIPTION OF PROCEDURE: The patient was brought to the vascular and interventional radiology suite. The left upper extremity was sterilely prepped and draped and a sterile surgical field was created. The fistula was accessed in a retrograde direction in the mid to upper upper arm under direct ultrasound guidance with a micropuncture needle and a permanent image was recorded. A micropuncture wire and sheath were then placed and upsized to a 6-French sheath. The patient was given 3000 units of  intravenous heparin for systemic anticoagulation. Imaging did demonstrate a high-grade stenosis just about 2 cm beyond the anastomosis in the perianastomotic region. In addition, there was what appeared to be a moderate stenosis in the 60% to 70% range in the mid upper arm cephalic vein, maybe 10 to 12 cm, separate from the perianastomotic stenosis. I crossed these lesions without difficulty with a Magic Torque wire. A 7 mm diameter angioplasty balloon was inflated in both locations. However, the conventional angioplasty balloon at the perianastomotic region would not break the waist, even at burst pressure. I had to exchange for a high-pressure angioplasty balloon and inflate this up to 26 atmospheres before the waist was broken. Following holding this angioplasty for a second time, the area showed mild residual stenosis in the 20% to 30% range, which did not appear flow limiting. The mid upper arm cephalic vein stenosis also had a mild residual stenosis of less than 30% and at this point, I elected to terminate the procedure. The sheath was removed around a 4-0 Monocryl pursestring suture. Pressure was held. Sterile dressing was placed. The patient tolerated the procedure well and was taken to the recovery room in stable condition.     ____________________________ Annice NeedyJason S. Dew, MD jsd:jm D: 07/25/2012 13:10:04 ET T: 07/25/2012 13:43:15 ET JOB#: [id number]  cc: Annice NeedyJason S. Dew, MD, <Dictator> Annice NeedyJASON S DEW MD ELECTRONICALLY SIGNED 07/29/2012 9:31

## 2014-09-11 NOTE — Discharge Summary (Signed)
PATIENT NAME:  Andrew [name], Andrew [name] MR#:  [id number] DATE OF BIRTH:  04-Jun-1950  DATE OF ADMISSION:  03/19/2013 DATE OF DISCHARGE:  03/24/2013  ADDENDUM:  Please see discharge summary dictated by Dr. Elpidio AnisSudini on the 2nd of November. The patient was waiting for [facility], which is available now. All other things remain the same as Dr. Elpidio AnisSudini has dictated in the discharge summary.  The patient remains stable. His last set of vitals are as follows:  Temperature 98.1, heart rate 76 per minute, respirations 18 per minute, blood pressure 123/78 mmHg.  He was saturating 97% on 2 liters oxygen via nasal cannula.   PERTINENT PHYSICAL EXAMINATION ON THE DATE OF DISCHARGE:  CARDIOVASCULAR: S1, S2 normal. No murmurs, rubs or gallop.  LUNGS: Clear to auscultation bilaterally. No wheezing, rales, rhonchi or crepitation.  ABDOMEN: Soft, benign.  NEUROLOGIC: Nonfocal examination.   All other physical examination remained at baseline.   Total time taking care of this patient: 45 mins  ____________________________ Josias Tomerlin S. Sherryll BurgerShah, MD vss:cs D: 03/24/2013 15:02:00 ET T: 03/24/2013 15:50:17 ET JOB#: [id number]  cc: [name]. Maryellen PileEason, MD [name], MD, <Dictator>  Ellamae SiaVIPUL S [facility] MD ELECTRONICALLY SIGNED 03/26/2013 17:20

## 2014-09-11 NOTE — Discharge Summary (Signed)
PATIENT NAME:  Andrew [name], Andrew [name] MR#:  [id number] DATE OF BIRTH:  07/15/1950  DATE OF ADMISSION:  09/16/2012  DATE OF DISCHARGE:  09/18/2012  PRIMARY CARE PHYSICIAN:  Dr. Lacie ScottsNiemeyer  DISCHARGE DIAGNOSES: 1.  Acute on chronic diastolic congestive heart failure.  2.  Hyperkalemia.  3.  Pulmonary edema.  4.  End-stage renal disease, on hemodialysis.   CONSULT:  Dr. Cherylann RatelLateef of Nephrology.   IMAGING STUDIES DONE:  Include a chest x-ray, which showed pulmonary edema.   ADMITTING HISTORY AND PHYSICAL AND HOSPITAL COURSE: Please see detailed H and P dictated previously on 09/16/2012. In brief, a 64 year old male patient with end-stage renal disease, chronic diastolic heart failure, presented to the hospital complaining of worsening shortness of breath. The patient was found to be in acute pulmonary edema, hyperkalemia, was admitted for stat hemodialysis with acute respiratory failure. The patient had acute respiratory failure present on admission secondary to pulmonary edema. This was secondary to dietary indiscretion with salty, barbecued food and increased fluid intake. The patient had stat dialysis, with which he improved, but needed 3 rounds of dialysis during the hospital stay to get his pulmonary edema reversed, and on the day of discharge the patient was saturating 94% on room air, does not have any crackles on exam. The patient was given congestive heart failure education, has been set up with home health for physical therapy and nursing for congestive heart failure. The patient has been counseled regarding low salt diet, fluid restriction, being compliant with his dialysis and diet, and patient verbalized understanding.   The patient's hyperkalemia resolved with dialysis.   DISCHARGE MEDICATIONS INCLUDE:  1.  Vitamin D3, 5000 international units oral once a day.  2.  Lipitor 10 mg oral once a day.  3.  Benadryl 25 mg oral 2 times a day.  4.  Omeprazole 20 mg oral once a day.  5.  Docusate  sodium 100 mg 2 capsules oral once a day as needed.  6.  Acetaminophen 650 mg oral every 4 hours as needed.  7.  Metoprolol succinate 25 mg oral once a day.   DISCHARGE INSTRUCTIONS: The patient had an echo previously to admission, which showed ejection fraction greater than 50%. Cardiac renal diet. Activity as tolerated. Follow up with home health for PT and for heart failure. Continue dialysis as previously scheduled.    Time spent on day of discharge in discharge activity was 43 minutes.     ____________________________ Molinda BailiffSrikar R. Melondy Blanchard, MD srs:mr D: 09/19/2012 16:19:00 ET T: 09/19/2012 19:32:00 ET JOB#: [id number]  cc: Wardell HeathSrikar R. Herbie Lehrmann, MD, <Dictator> Meindert A. Lacie ScottsNiemeyer, MD  Orie FishermanSRIKAR R Sreeja Spies MD ELECTRONICALLY SIGNED 10/02/2012 13:55

## 2014-09-11 NOTE — Op Note (Signed)
PATIENT NAME:  Andrew [name], Yaman B MR#:  [id number] DATE OF BIRTH:  1950-10-04  DATE OF PROCEDURE:  01/16/2013  PREOPERATIVE DIAGNOSES: 1.  End-stage renal disease.  2.  Morbid obesity.  3.  Poorly functioning left arm arteriovenous fistula.   POSTOPERATIVE DIAGNOSES: 1.  End-stage renal disease.  2.  Morbid obesity.  3.  Poorly functioning left arm arteriovenous fistula.  PROCEDURES: 1. Ultrasound guidance for vascular access to the fistula in a retrograde fashion.  2.  Left upper extremity fistulogram.  3. Percutaneous transluminal angioplasty of mid upper arm cephalic vein stenosis with 6 mm and 7 mm diameter angioplasty balloon. 4. Percutaneous transluminal angioplasty of perianastomotic stenosis with 6 mm diameter angioplasty balloon.   SURGEON:  Annice NeedyJason S. Divine Imber, MD.   ANESTHESIA:  Local with moderate conscious sedation.   ESTIMATED BLOOD LOSS:  Approximately 25 mL.   INDICATION FOR PROCEDURE:  This is a gentleman who we treat for his dialysis access needs. He has a left brachiocephalic AV fistula. This has been surgically superficialized but remains small and does not have adequate flow for dialysis. Noninvasive study showed 2 areas of stenosis, and he is brought in for an angiogram for possible treatment. Risks and benefits are discussed. Informed consent is obtained.   DESCRIPTION OF PROCEDURE:  The patient is brought to the vascular interventional radiology suite. The left upper extremity was sterilely prepped and draped and a sterile surgical field was created. I initially accessed the fistula in an antegrade fashion about 6 cm to 8 cm beyond the anastomosis with a micropuncture needle under direct ultrasound guidance and a permanent image was recorded. Micropuncture wire and sheath were placed. A near occlusive stenosis was seen in the mid upper arm cephalic vein likely in the swing point from where it went from a superficialized vein to the deeper vein from intimal hyperplasia. In  addition, there was a near occlusive stenosis at the perianastomotic region. This would have to be accessed through a separate retrograde access later. I crossed this lesion without difficulty with a Terumo Advantage wire and treated at first with a 6 and then with a 7 mm diameter angioplasty balloon with a good angiographic completion result. I then accessed in a retrograde fashion and crossed the perianastomotic stenosis with Advantage wire. A 6 mm diameter angioplasty balloon was inflated in this location with mild residual stenosis after angioplasty. At this point, I elected to terminate the procedure. Both sheaths were removed around 4-0 Monocryl purse sutures. Pressure was held. Sterile dressing was placed. The patient tolerated the procedure well and was taken to the recovery room in stable condition.    ____________________________ Annice NeedyJason S. Liz Pinho, MD jsd:ce D: 01/16/2013 11:58:33 ET T: 01/16/2013 12:23:22 ET JOB#: [id number]  cc: Annice NeedyJason S. Verdella Laidlaw, MD, <Dictator> Annice NeedyJASON S Naheim Burgen MD ELECTRONICALLY SIGNED 01/22/2013 16:16

## 2014-09-11 NOTE — H&P (Signed)
PATIENT NAME:  Andrew [name], Andrew [name] MR#:  [id number] DATE OF BIRTH:  September 15, 1950  DATE OF ADMISSION:  02/21/2013  PRIMARY CARE PROVIDER: Dr. Toy Cookey.   NEPHROLOGIST:  Dr.  Austin Miles at [facility] nephrology.   CHIEF COMPLAINT: Chest pain.   HISTORY OF PRESENT ILLNESS: The patient is a 64 year old African American male with a history of end-stage renal disease, obstructive sleep apnea, paroxysmal atrial fibrillation with  chronic diastolic CHF, sarcoidosis, morbid obesity, who was recently hospitalized from 09/22 to 10/02.  At that time, he was admitted with acute respiratory failure, sepsis, pneumonia. The patient was discharged yesterday and he was sent to dialysis for his regular hemodialysis today when he started complaining of chest pain, which he described as a sharp type of pain. He reports that he also had pain going across from his right upper arm to his left upper arm. It persisted for about 1 hour. His dialysis had to be terminated. The patient reports that he has never had symptoms like these previously. He reports that he apparently had a stress test here 3 months ago, a nuclear stress test; however, I am not able to review the results of this. He also had echo in September which showed normal EF without any wall motion abnormality. The patient otherwise denies any fevers or chills. No abdominal pain, nausea, vomiting, diarrhea or urinary symptoms.   PAST MEDICAL HISTORY:  Is  significant for: 1.  Sepsis due to pneumonia.  2.  Acute respiratory failure.  3.  End-stage renal disease on hemodialysis.  4.  Obstructive sleep apnea, unable to tolerate CPAP.  5.  Chronic respiratory failure, now on oxygen since discharge yesterday.  6.  Paroxysmal atrial fibrillation with slow ventricular rate during hospitalization.  7.  Mobitz type I, heart block.  8.  Bladder spasms.  9.  History of diastolic CHF.  10.  Sarcoidosis.  11.  Morbid obesity.  12.  Cerebral palsy walks with a walker.  13.  History  of infection of the AV fistula in the past.  14.  BPH.  15.  Previous history of hyperkalemia.   ALLERGIES: CODEINE, PENICILLIN AND MANGO.   SOCIAL HISTORY: Does not smoke. Does not drink. No drugs.   FAMILY HISTORY: Mother had kidney problems and was on dialysis. Father had heart disease, but not sure what type.   CURRENT MEDICATIONS: He is on atorvastatin 10 daily, PhosLo 3 caps daily, loratadine 10 daily, Tylenol 650 b.i.d. p.r.n. for pain, omeprazole 20 daily, Norco 5/325 q.4 p.r.n. for pain, aspirin 81 mg 1 tab p.o. daily, Sudafed 120 p.o. b.i.d., Senna 1 tab p.o. b.i.d., MiraLax 17 grams daily, Midodrine 15 mg p.o. t.i.d., oxybutynin 5 p.o. b.i.d.   REVIEW OF SYSTEMS:   CONSTITUTIONAL: Denies any fevers. Has some fatigue.  EYES:  Complains of bilateral conjunctival erythema and no cataracts. No glaucoma.  ENT: No tinnitus. No ear pain. No hearing loss.  RESPIRATORY: Denies any cough, wheezing, or hemoptysis. Has sleep apnea.  CARDIOVASCULAR: Complains of chest pain as above and has some chronic orthopnea. Denies any syncope.  GASTROINTESTINAL: No nausea, vomiting, diarrhea, or abdominal pain.  GENITOURINARY: Denies any dysuria, hematuria, frequency.  ENDOCRINE: No polyuria, nocturia or thyroid problems.  HEMATOLOGIC AND LYMPHATIC: Has a history of anemia of chronic disease.  SKIN: No acne. No rash. No changes in mole, hair or skin.  MUSCULOSKELETAL: Has weakness in the lower extremity secondary due to cerebral palsy.  NEUROLOGIC: No seizures.  PSYCHIATRIC: No anxiety or depression.  PHYSICAL EXAMINATION: VITAL SIGNS: Temperature 97.4, pulse 71, respirations 18, blood pressure 141/67, O2 92%.  GENERAL: The patient is an obese African American male in no acute distress.  HEENT: Head atraumatic, normocephalic. Pupils equally round, reactive to light and accommodation. There is no conjunctival pallor. No scleral icterus. Nasal exam shows no drainage or ulceration. Oropharynx is clear  without any exudate.  NECK: Supple. No thyromegaly, no lymph nodes palpable. Trachea midline. No JVD.  CARDIOVASCULAR: Regular rate and rhythm. No murmurs, rubs, clicks or gallops.  He has no edema.  RESPIRATORY: Clear to auscultation bilaterally without any rales, rhonchi, wheezing.  ABDOMEN: Soft, nontender, nondistended. Positive bowel sounds x 4.   SKIN: No rash.  LYMPHATICS: No lymph nodes palpable.  VASCULAR: Good DP, PT pulses.  PSYCHIATRIC: Not anxious or depressed.  NEUROLOGIC: Motor strength is diminished in both lower extremities.   DIAGNOSTIC EVALUATION:  EKG shows sinus rhythm with marked sinus arrhythmia with first-degree AV block, left axis deviation, LVH, inferior infarct, nonspecific ST-T wave changes. Glucose 102. BUN 32, creatinine 7.57, sodium 138, potassium is 4.4, chloride 100, CO2 is 30. LFTs showed albumin of 3.1. CPK is 64. CK-MB is 1.1. Troponin less the 0.02. WBC 9.5, hemoglobin 9.2, platelet count 254.   ASSESSMENT AND PLAN: The patient is a 64 year old African American male with recent discharge from the hospital, who presents with chest pain, sounds atypical in nature.  1.  Chest pain. Has multiple risk factors. At this time, we will placement patient under observation. Have Cardiology evaluate. 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 His echo did not show any significant wall motion abnormality.  2.  End-stage renal disease. Will get Nephrology to do hemodialysis.  3.  Bilateral conjunctivitis. Will start him on eye drops.   4.  Sleep apnea, noncompliant with CPAP.  5.  Paroxysmal atrial fibrillation. Will monitor him on telemetry.  6.  Patient with morbid obesity.  7.  Miscellaneous. We will use heparin for deep vein thrombosis prophylaxis.   TIME SPENT:  45 minutes.     ____________________________ Lacie ScottsShreyang H. Allena KatzPatel, MD shp:dp D: 02/21/2013 16:32:53 ET T: 02/21/2013 17:10:55  ET JOB#: [id number]  cc: Shayde Gervacio H. Allena KatzPatel, MD, <Dictator>

## 2014-09-11 NOTE — Consult Note (Signed)
General Aspect PCP: Dr Brynda Greathouse   Present Illness 64 yo AAM without prior cardiac history but with ESRD on HD who presents with chest pressure.  He reports being in his usual health state until yesterday around 1pm when he developed severe chest pressure while on dialysis.  He has had difficulty with hypotension chronically but is not certain that his BP was low at the time.  He reports associated pain in his arms.  He denies associated SOB, N/V, dizziness, presyncope, syncope, palpitations, or any other symptoms.  His symptoms gradually resolved upon arrival to [facility].  He has been pain free since that time.  Presently, he is resting without complaint.  He reports having a stress test at [facility] 3 months ago, though results are presently unavailable.  PMH: ESRD on HD OSA, unable to tolerate CPAP HTN chronic respiratory failure on home O2 obesity parxoysmal atrial fibrillation preserved EF sarcoidosis cerebral palsy, walks with a walker prior bacteremia/ AV fistual infection  Allergies- reviewed Medicines- reviewed  SH- denies tobacco, ETOH, drug use  FH- mother with renal failure, father with heart disease  ROS- all systems reviewed and negative except as per HPI   Physical Exam:  GEN obese, critically ill appearing   HEENT Oropharynx clear   NECK supple   RESP no use of accessory muscles  decreased BS throughout   CARD Regular rate and rhythm  No murmur   ABD denies tenderness  normal BS   LYMPH negative neck   EXTR negative cyanosis/clubbing, + dependant edema   SKIN normal to palpation   NEURO motor/sensory function intact   Lab Results: Hepatic:  03-Oct-14 14:51   Bilirubin, Total 0.4  Alkaline Phosphatase 115  SGPT (ALT) 12  SGOT (AST) 17  Total Protein, Serum 7.5  Albumin, Serum  3.1  Routine Chem:  03-Oct-14 14:51   Glucose, Serum  102  BUN  32  Creatinine (comp)  7.57  Sodium, Serum 138  Potassium, Serum 4.4  Chloride, Serum 100  CO2,  Serum 30  Calcium (Total), Serum 8.9  Osmolality (calc) 283  eGFR (African American)  8  eGFR (Non-African American)  7 (eGFR values <30m/min/1.73 m2 may be an indication of chronic kidney disease (CKD). Calculated eGFR is useful in patients with stable renal function. The eGFR calculation will not be reliable in acutely ill patients when serum creatinine is changing rapidly. It is not useful in  patients on dialysis. The eGFR calculation may not be applicable to patients at the low and high extremes of body sizes, pregnant women, and vegetarians.)  Anion Gap 8  Cardiac:  03-Oct-14 14:51   CK, Total 64  CPK-MB, Serum 1.1 (Result(s) reported on 21 Feb 2013 at 03:21PM.)  Troponin I < 0.02 (0.00-0.05 0.05 ng/mL or less: NEGATIVE  Repeat testing in 3-6 hrs  if clinically indicated. >0.05 ng/mL: POTENTIAL  MYOCARDIAL INJURY. Repeat  testing in 3-6 hrs if  clinically indicated. NOTE: An increase or decrease  of 30% or more on serial  testing suggests a  clinically important change)    22:40   CK, Total 53  CPK-MB, Serum 1.3 (Result(s) reported on 21 Feb 2013 at 11:12PM.)  Troponin I < 0.02 (0.00-0.05 0.05 ng/mL or less: NEGATIVE  Repeat testing in 3-6 hrs  if clinically indicated. >0.05 ng/mL: POTENTIAL  MYOCARDIAL INJURY. Repeat  testing in 3-6 hrs if  clinically indicated. NOTE: An increase or decrease  of 30% or more on serial  testing suggests a  clinically  important change)  Routine Hem:  03-Oct-14 14:51   WBC (CBC) 9.5  RBC (CBC)  2.78  Hemoglobin (CBC)  9.2  Hematocrit (CBC)  26.5  Platelet Count (CBC) 254 (Result(s) reported on 21 Feb 2013 at 03:03PM.)  MCV 95  MCH 32.9  MCHC 34.5  RDW  15.2   EKG:  EKG Interp. by me  sinus rhythm 83 bpm, mobitz I second degree AV Block, RBBB, LAD, nonspecific ST/T changes   Radiology Results: XRay:    03-Oct-14 14:14, Chest Portable Single View  Chest Portable Single View   REASON FOR EXAM:    Chest pain  COMMENTS:        PROCEDURE: DXR - DXR PORTABLE CHEST SINGLE VIEW  - Feb 21 2013  2:14PM     RESULT: Comparison is made to a study of February 18, 2013.    The lungs are slightly better inflated today. The pulmonary vascularity   is engorged and the pulmonary interstitial markings are increased. The   cardiac silhouette is mildly enlarged. The dual-lumen dialysis type   catheter is unchanged in appearance and position. There is fluid in the   minor fissure on the right.    IMPRESSION:  The findings are consistent with congestive heart failure.   The appearance of the lungs and pulmonary vascularity has deteriorated     since the previous study.     Dictation Site: 2        Verified By: DAVID A. Martinique, M.D., MD    Tylenol with Codeine #3: Anaphylaxis  Vital Signs/Nurse's Notes: **Vital Signs.:   04-Oct-14 08:09  Vital Signs Type Routine  Temperature Temperature (F) 99.6  Celsius 37.5  Temperature Source oral  Pulse Pulse 92  Respirations Respirations 22  Systolic BP Systolic BP 588  Diastolic BP (mmHg) Diastolic BP (mmHg) 72  Mean BP 89  Pulse Ox % Pulse Ox % 87  Pulse Ox Activity Level  At rest  Oxygen Delivery 2L  Pulse Ox After Adjustment (RN or RCP Only) % 93  Oxygen Delivery Adjusted To (RN or RCP Only)  4L    Impression 64 yo AAM with multiple comorbidities including ESRD on HD.  He has had difficulty with hypotension recently, though he is not certain as to whether he had hypotension during his chest pain yesterday.  Here, CMs are negative and ekg does not reveal ischemic changes.  Per report, he had a low risk myoview at [facility] 3 months ago, though I do not have records of this presently.  He has never had prior chest pain and has been chest pain free since arrival to [facility] yesterday.   Plan 1. Chest pain unclear etiology, though worrisome for angina, possibly related to hemodialysis and strains related to this.  I am not sure if he was hypotensive at the time. We  need to verify that he had a myoview 3 months ago at [facility].  If this was low risk, then I would recommend medical therapy.  If he has not had a myoview then he will need one arranged.  I would reserve cath for recurrent symptoms. stop sudafed.  I think that the combination of sudafed and midodrine could cause coronary spasm and potential problems for this patient.  2. mobitz I second degree AV block asymptomatic, no indication for intervention presently would not start AV nodal agents at this time  3. OSA- compliance with CPAP and weight loss is essential  Given his overall health state, I  am concerned that his prognosis long term is poor. No inpatient cardiac intervention is necessary at this time.  OK to discharge from my standpoint.  I will arrange follow-up with Dr Fletcher Anon in the next 1-2 weeks. Please call with questions.  I will see as needed while here.   Electronic Signatures: Coralyn Mark (MD)  (Signed 04-Oct-14 16:17)  Authored: General Aspect/Present Illness, History and Physical Exam, Labs, EKG , Radiology, Allergies, Vital Signs/Nurse's Notes, Impression/Plan   Last Updated: 04-Oct-14 16:17 by Coralyn Mark (MD)

## 2014-09-11 NOTE — H&P (Signed)
PATIENT NAME:  Andrew [name], HOEFFNER MR#:  [id number] DATE OF BIRTH:  10-11-1950  DATE OF ADMISSION:  03/19/2013  PRIMARY CARE PHYSICIAN: Alden Server B. Maryellen Pile, MD  CHIEF COMPLAINT: Chills and bladder spasms.   HISTORY OF PRESENT ILLNESS: This is a 64 year old male who presents to the hospital with chills and having some belly pain and also bladder spasms. The patient apparently has end-stage renal disease and is on hemodialysis, was at dialysis today, finished his dialysis and was doing fine but was noted to be febrile and he was also noted to have bladder spasms. The patient had blood cultures drawn at his dialysis and he was also given IV vancomycin and IV ceftazidime there. He was sent over to the ER for further evaluation. In the Emergency Room, the patient still continued to spike fevers of 102 and 103 here and was noted to be hypotensive with blood pressures in the 80s. The patient was noted to also have mild lactic acidosis and noted to have severe sepsis and septic shock. Hospitalist services were contacted for further treatment and evaluation.   REVIEW OF SYSTEMS: CONSTITUTIONAL: Positive documented fever of 102 to 103. No weight gain or weight loss.  EYES: No blurred or double vision.  ENT: No tinnitus. No postnasal drip. No redness of the oropharynx.  RESPIRATORY: No cough, no wheeze, no hemoptysis, no dyspnea.  CARDIOVASCULAR: No chest pain. No orthopnea, no palpitations, no syncope.  GASTROINTESTINAL: No nausea, no vomiting, no diarrhea. Positive abdominal pain. No melena or hematochezia.  GENITOURINARY: No dysuria. No hematuria. No frequency. Positive bladder spasms.  ENDOCRINE: No polyuria or nocturia. No heat or courthouse intolerance.  HEMATOLOGIC: No anemia, no bruising, no bleeding.  INTEGUMENTARY: No rashes. No lesions.  MUSCULOSKELETAL: No arthritis, no swelling, no gout.  NEUROLOGIC: No numbness or tingling. No ataxia. No seizure-type activity.  PSYCHIATRIC: No anxiety, no  insomnia. No ADD.   PAST MEDICAL HISTORY: Consistent with end-stage renal disease on hemodialysis, obstructive sleep apnea, history of recent sepsis due to pneumonia, paroxysmal A. fib, GERD, history of bladder spasms.   ALLERGIES: CODEINE WHICH CAUSES NAUSEA AND VOMITING.   SOCIAL HISTORY: No smoking. No alcohol abuse. No illicit drug abuse. Resides at [facility] skilled nursing facility.   FAMILY HISTORY: Both mother and father are deceased. Both mother and father had heart disease. Mother had kidney problems and also was on dialysis.   CURRENT MEDICATIONS: As follows: Aspirin 81 mg daily, atorvastatin 10 mg daily, Colace with Senokot 50/8.6 one tab b.i.d., loratadine 10 mg daily, Tylenol 650 b.i.d. as needed, Midodrine 5 mg 3 tabs t.i.d. on the days of dialysis, MiraLax daily as needed, Norco 5/325 one tab q.4 hours as needed, omeprazole 20 mg daily, oxybutynin 5 mg b.i.d. and PhosLo 667 mg 3 caps t.i.d. with meals.   PHYSICAL EXAMINATION: Presently is as follows:  VITAL SIGNS:  Temperature is 100.4, pulse 110, respirations 18, blood pressure 104/54, sats 97% on room air.  GENERAL: He is a pleasant-appearing male in no apparent distress.  HEAD, EYES, EARS, NOSE AND THROAT EXAM: He is atraumatic, normocephalic. Extraocular muscles are intact. Pupils are equal and reactive to light. Sclerae anicteric. No conjunctival injection. No pharyngeal erythema.  NECK: Supple. There is no jugular venous distention. No bruits, no lymphadenopathy or thyromegaly.  HEART: Regular rate and rhythm. No murmurs, no rubs, no clicks.  LUNGS: Clear to auscultation bilaterally. No rales, no rhonchi, no wheezes.  ABDOMEN: Soft, flat, nontender, nondistended. Has good bowel sounds. No hepatosplenomegaly appreciated.  EXTREMITIES: No evidence of any cyanosis, clubbing or peripheral edema. He has a left upper extremity AV fistula which has a good bruit and good thrill with no evidence of any drainage or  infection.  SKIN: Moist and warm with no rashes appreciated.  LYMPHATIC: There is no cervical or axillary lymphadenopathy.  NEUROLOGICAL: The patient is alert, awake and oriented x 3 with no focal motor or sensory deficits appreciated bilaterally.   LABORATORY AND RADIOLOGICAL DATA: Serum glucose of 105, BUN 14, creatinine 4.5, sodium 134, potassium 3.7, chloride 99, bicarb 29. The patient's LFTs are within normal limits. Troponin less than 0.02. White cell count 9.2, hemoglobin 11.2, hematocrit 33.1, platelet count 174. INR is 1.0. Lactic acid 2.1. The patient did have a CT scan of the chest and abdomen and pelvis done without contrast which showed atherosclerotic changes, marked atrophy of the kidneys, calcified gallstones without evidence of cholecystitis, no other acute abnormality.   ASSESSMENT AND PLAN: This is a 64 year old male with a history of end-stage renal disease on hemodialysis, obstructive sleep apnea, history of recent sepsis due to pneumonia, paroxysmal atrial fibrillation, gastroesophageal reflux disease, history of bladder spasm, who presents to the hospital with fever, chills and abdominal pain, and noted to be hypotensive.  1.  Sepsis. This is likely the diagnosis given the patient's significantly elevated fevers, hypotension and also lactic acidosis. The exact source of the sepsis is unclear. The patient's CT  chest, abdomen and pelvis does not show any evidence of any pneumonia or any acute intra-abdominal pathology. There is some concern about cystitis but the patient does not make much urine. Questionable if this is related to bacteremia from his arteriovenous fistula being infected but it appears to be not infected on exam. I will empirically treat the patient with vancomycin and Zosyn, follow blood cultures, follow his fever curve. He will be admitted to the stepdown unit for now.  2.  Hypotension. This is likely related to the septic shock. I will give him gentle IV fluids as he  is a dialysis patient. Will consider starting him on low-dose pressors if needed.  3.  End-stage renal disease on hemodialysis. Will get a nephrology consult. The patient gets dialysis on Monday, Wednesday, Friday.  4.  Bladder spasms. I will continue his oxybutynin.  5.  Gastroesophageal reflux disease.  I will continue his omeprazole.  6.  CODE STATUS: The patient is a full code critical care.   CRITICAL CARE TIME SPENT: 50 minutes.   ____________________________ Rolly PancakeVivek J. Cherlynn KaiserSainani, MD vjs:cs D: 03/19/2013 19:35:46 ET T: 03/19/2013 19:55:08 ET JOB#: [id number]  cc: Rolly PancakeVivek J. Cherlynn KaiserSainani, MD, <Dictator> Houston SirenVIVEK J Gillermo Poch MD ELECTRONICALLY SIGNED 03/21/2013 14:35

## 2014-09-11 NOTE — Consult Note (Signed)
General Aspect [name] is a 64yo male w/ PMHx s/f ESRD (HD MWF), cerebral palsy, sarcoidosis, HFpEF, OSA (not on CPAP), morbid obesity, HTN, BPH and GERD who was admitted to [facility] after he was sent to the ED from dialysis for hypotension and bradycardia.   The patient is a resident of an ALF. He was admitted 08/2012 for A/C diastolic CHF. He reported experiencing increase dyspnea and orthopnea the night prior to HD. The following morning, he reports feeling "woozy" particularly on standing. He is unsure of the salt intake in his diet (prepared at the ALF). He does drink significant fluid intake. He has a h/o OSA, but does not wear CPAP. He denies chest pain, palpitations or syncope. No PND, LE edema, or weight gain. Dry weight 124 kg.   Present Illness In the ED, EKG revealed atrial fibrillation w/ slow VR (52 bpm). K+ was 7.2. BP trend 98-117/43-62. CE WNL x 1. CXR revealed diffuse interstitial edema with some alveolar infiltrate or edema in  the lower lung zones. He was given kayexelate, bicarb, Ca gluconate, insulin, albuterol. He was admitted by the medicine team. Renal was consulted and he underwent urgent dialysis (> 2L off). Two subsequent sets of CEs returned WNL. HR continued to be low in 40s. He was hypotensive overnight and started on dopamine. K+ 4.3 today. Bld cx x 2 NGTD. Echo pending. Telemetry review indicates atrial fibrillation with slow VR, intermittent junctional bradycardia, pauses> 2 sec. HR overnight 40s, currently 60-70s on dopamine. Abd/pelvic CT revealed changes c/w bilateral lower lobe PNA, renal atrophy, atherosclerosis, cholelithiasis.   PAST MEDICAL HISTORY 1.  Diastolic CHF.   2.  End-stage renal disease, on hemodialysis on Monday, Wednesday, Friday.  3.  Hypertension.  4.  Cerebral palsy and walks with a walker.  5.  Sarcoidosis.  6.  GERD.   7.  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  8.  Benign prostatic hypertrophy.  9.  Hyperkalemia.   SOCIAL HISTORY: The  patient does not smoke. No alcohol. No illicit drugs. Used to live in a group home but is in a nursing home at this time. He sees Dr. Maryellen Pile there.   CODE STATUS: Full code.   FAMILY HISTORY: His mother had kidney problems and was on dialysis. His father had heart disease but he is not sure what kind.   ALLERGIES: No known drug allergies.   Physical Exam:  GEN no acute distress, obese   HEENT pink conjunctivae, PERRL, hearing intact to voice   NECK supple  No masses  trachea midline  redundant neck tissue   RESP normal resp effort  clear BS  no use of accessory muscles  ventilation assisted by BiPAP; no wheezes, rales or rhonchi   CARD Irregular rate and rhythm  Normal, S1, S2  No murmur   ABD soft   EXTR negative cyanosis/clubbing, negative edema   SKIN tight to palpation   NEURO follows commands, motor/sensory function intact   PSYCH alert, A+O to time, place, person   Review of Systems:  Subjective/Chief Complaint dyspnea, lightheadedness   Cardiovascular: Dyspnea   Neurologic: Dizzness   Review of Systems: All other systems were reviewed and found to be negative   Home Medications: Medication Instructions Status  atorvastatin 10 mg oral tablet 1 tab(s) orally once a day Active  calcium acetate 667 mg oral capsule 3 cap(s) orally 3 times a day Active  acetaminophen-HYDROcodone 325 mg-5 mg oral tablet 1 tab(s) orally every 4 hours, As  Needed - for Pain Active  loratadine 10 mg oral tablet 1 tab(s) orally every other day for allergies/pruritis Active  Mapap 325 mg oral tablet 2 tab(s) (650 mg) orally 2 times a day for pain management Active  Metoprolol Succinate ER 25 mg oral tablet, extended release 1 tab(s) orally once a day Active  MiraLax - oral powder for reconstitution 17 gram(s) (1 capful) mixed in fluid (water) orally 2 times a day for constipation Active  omeprazole 20 mg oral delayed release capsule 1 cap(s) orally once a day Active   Lab Results:   Routine Chem:  22-Sep-14 12:45   Potassium, Serum  7.2    17:23   Potassium, Serum  6.1 (Result(s) reported on 10 Feb 2013 at 05:53PM.)    18:07   Potassium, Serum  5.6    23:49   Potassium, Serum  5.5  23-Sep-14 04:40   Potassium, Serum 4.3  Cardiac:  22-Sep-14 12:45   CK, Total 69  CPK-MB, Serum 1.1 (Result(s) reported on 10 Feb 2013 at 01:36PM.)  Troponin I < 0.02 (0.00-0.05 0.05 ng/mL or less: NEGATIVE  Repeat testing in 3-6 hrs  if clinically indicated. >0.05 ng/mL: POTENTIAL  MYOCARDIAL INJURY. Repeat  testing in 3-6 hrs if  clinically indicated. NOTE: An increase or decrease  of 30% or more on serial  testing suggests a  clinically important change)    23:49   CK, Total 86  CPK-MB, Serum 2.2 (Result(s) reported on 11 Feb 2013 at 01:39AM.)  Troponin I < 0.02 (0.00-0.05 0.05 ng/mL or less: NEGATIVE  Repeat testing in 3-6 hrs  if clinically indicated. >0.05 ng/mL: POTENTIAL  MYOCARDIAL INJURY. Repeat  testing in 3-6 hrs if  clinically indicated. NOTE: An increase or decrease  of 30% or more on serial  testing suggests a  clinically important change)  23-Sep-14 04:40   CK, Total 77  CPK-MB, Serum 2.1 (Result(s) reported on 11 Feb 2013 at 05:32AM.)  Troponin I < 0.02 (0.00-0.05 0.05 ng/mL or less: NEGATIVE  Repeat testing in 3-6 hrs  if clinically indicated. >0.05 ng/mL: POTENTIAL  MYOCARDIAL INJURY. Repeat  testing in 3-6 hrs if  clinically indicated. NOTE: An increase or decrease  of 30% or more on serial  testing suggests a  clinically important change)   EKG:  Interpretation EKG shows sinus arrhythmia, junctional escape beats   Rate 52   Radiology Results:  Cardiology:    23-Sep-14 13:54, Echo Doppler  Echo Doppler   REASON FOR EXAM:      COMMENTS:       PROCEDURE: ECH - ECHO DOPPLER COMPLETE(TRANSTHOR)  - Feb 11 2013  1:54PM     RESULT: Echocardiogram Report    Patient Name:   Andrew [name] Date of Exam: 02/11/2013  Medical Rec  #:  [id number]            Custom1:  Date of Birth:  [birth year]        Height:       61.0 in  Patient Age:    61 years          Weight:       282.0 lb  Patient Gender: M                 BSA:          2.19 m??    Indications: SOB  Sonographer:    Cristela Blue RDCS  Referring Phys: Milagros Loll, R    Sonographer Comments: Technically difficult study  due to poor echo   windows.    Summary:   1. Essentially a normal study.   2. Left ventricular ejection fraction, by visual estimation, is 60 to   65%.   3. Normal global left ventricular systolic function.   4. Normal right ventricular size and systolic function.   5. Normal RVSP  2D AND M-MODE MEASUREMENTS (normal ranges within parentheses):  Left Ventricle:          Normal  IVSd (2D):      0.86 cm (0.7-1.1)  LVPWd (2D):     1.23 cm (0.7-1.1) Aorta/LA:                  Normal  LVIDd (2D):     3.65 cm (3.4-5.7) Aortic Root (2D): 3.40 cm (2.4-3.7)  LVIDs (2D):     2.28 cm           Left Atrium (2D): 3.70 cm (1.9-4.0)  LV FS (2D):     37.5 %   (>25%)  LV EF (2D):     68.5 %   (>50%)                                    Right Ventricle:                                    RVd (2D):        2.65 cm  LV DIASTOLIC FUNCTION:  MV Peak E: 0.78 m/s E/e' Ratio: 6.30  MV Peak A: 0.67 m/s Decel Time: 320 msec  E/A Ratio: 1.17  SPECTRAL DOPPLER ANALYSIS (where applicable):  Mitral Valve:  MV P1/2 Time: 92.80 msec  MV Area, PHT: 2.37 cm??  Aortic Valve: AoV Max Vel: 1.45 m/s AoV Peak PG: 8.4 mmHg AoV Mean PG:  LVOT Vmax: 1.20 m/s LVOT VTI:  LVOT Diameter: 2.10 cm  AoV Area, Vmax: 2.87 cm?? AoV Area, VTI:  AoV Area, Vmn:  Tricuspid Valve and PA/RV Systolic Pressure: TR Max Velocity: 1.79 m/s RA   Pressure: 5 mmHg RVSP/PASP: 17.8 mmHg  Pulmonic Valve:  PV Max Velocity: 1.08 m/s PV Max PG: 4.7 mmHg PV Mean PG:    PHYSICIAN INTERPRETATION:  Left Ventricle: The left ventricular internal cavity size was normal. LV   posterior wall thickness was normal. No  left ventricular hypertrophy.   Global LV systolic function was normal. Left ventricular ejection   fraction, by visual estimation, is 60 to 65%. Spectral Doppler shows   normal pattern of LV diastolic filling.  Right Ventricle: Normal right ventricular size, wall thickness, and     systolic function. The right ventricular size is normal. Global RV   systolic function is normal.  Left Atrium: The left atrium is normal in size.  Right Atrium: The right atrium is normal in size.  Pericardium: There is no evidence of pericardial effusion.  Mitral Valve: The mitral valve is normal in structure. Trace mitral valve   regurgitation is seen.  Tricuspid Valve: The tricuspid valve is normal. Trivial tricuspid   regurgitation is visualized. The tricuspid regurgitant velocity is 1.79   m/s, and with an assumed right atrial pressure of 5 mmHg, the estimated   right ventricular systolic pressure is normal at 17.8 mmHg.  Aortic Valve: The aortic valve is normal. The aortic valve is   structurally normal, with no  evidence of sclerosis or stenosis.  Pulmonic Valve: The pulmonic valve is normal.  Aorta: The aortic root is normal in size and structure.  [id number] Julien Nordmann MD  Electronically signed by [id number] Julien Nordmann MD  Signature Date/Time: 02/11/2013/2:47:46 PM    *** Final ***    IMPRESSION: .        Verified By: Antonieta Iba, M.D., MD  CT:    23-Sep-14 11:40, CT Abdomen and Pelvis With Contrast  CT Abdomen and Pelvis With Contrast   REASON FOR EXAM:    (1) abdominal pain / pt has esrd and is on crt ( ok   to use contrast ); (2) abd p  COMMENTS:       PROCEDURE: CT  - CT ABDOMEN / PELVIS  W  - Feb 11 2013 11:40AM     RESULT: CT of the abdomen and pelvis is performed with 100 mL of   Isovue-370 iodinated intravenous contrast and oral contrast. Images are   reconstructed in the axial plane at 3 mm slice thickness. The patient has   no previous similar study for  comparison.    There is a right common femoral line present with the tip terminating in   the proximal external iliac vein near the common iliac bifurcation. The   appendix appears normal. There is a small fat filled umbilical hernia.   There is no significant fluid in the urinary bladder. There is no     abnormal gastric, enteric or colonic distention. The pancreas appears   unremarkable. Cholelithiasis is present. The liver and spleen are   unremarkable. The adrenal glands appear normal. The kidneys are severely   atrophic. Presumably the patient receives dialysis. The lung bases show   atelectasis versus infiltrate in both lower lobes. Is the patient risk   respiration? No significant pleural effusion or pericardial effusion is   evident. The bony structures show normal alignment in the spine. There is   no spine fracture, lower rib fracture or pelvic fracture.    IMPRESSION:   1. Bilateral lower lobe pneumonia is a concern.  2. Cholelithiasis.  3. Bilateral renal atrophy.  4. Atherosclerotic disease.  Dictation Site: 1        Verified By: Elveria Royals, M.D., MD    No Known Allergies:   Vital Signs/Nurse's Notes: **Vital Signs.:   23-Sep-14 11:00  Vital Signs Type Routine  Temperature Temperature (F) 97.8  Celsius 36.5  Pulse Pulse 62  Respirations Respirations 21  Pulse Ox % Pulse Ox % 88  Pulse Ox Activity Level  At rest  Oxygen Delivery 3L  Pulse Ox Heart Rate 64  *Intake and Output.:   Daily 23-Sep-14 07:00  Grand Totals Intake:  504.2 Output:  2658    Net:  -2153.8 24 Hr.:  -2153.8  Dopamine      In:  454.2  IV (Primary)      In:  50  Urine ml     Out:  20  Ultrafiltration ml     Out:  2638  Length of Stay Totals Intake:  504.2 Output:  2658    Net:  -2153.8    Impression 64yo male w/ PMHx s/f ESRD (HD MWF), cerebral palsy, sarcoidosis, HFpEF, HTN, BPH and GERD who was admitted to [facility] after he was sent to the ED from dialysis for hypotension  and bradycardia.  1. Bradycardia/arrhythmia From [place] abnormality Appeared to be sinus arrhythmia, junctional escape rhythm.  Unable to definatively exclude atrial fib,  now NSR Improved as potassium has corrected, on low dose dopamine with rates in the high 60s to 70s at rest.  --Would continue dopamine overnight, wean as heart rate tolerates Suspect we will be able to wean now through tomorrow  2. Acute respiratory failure Multifactorial - acute on chronic diastolic CHF, HCAP PNA on CT scan  3. Hypotension Improved with improved heart rate and dopamine OK to wsean dopamine as BP tolerates May improve as PNA improves on abx  4. Hyperkalemia Improving with CRRT  5. ESRD on HD  6. OSA/OHS Currently on CPAP   Electronic Signatures: Marielle Mantione A (PA-C)  (Signed 23-Sep-14 12:54)  Authored: General Aspect/Present Illness, History and Physical Exam, Review of System, Home Medications, Labs, EKG , Allergies, Vital Signs/Nurse's Notes, Impression/Plan Julien NordmannGollan, Timothy (MD)  (Signed 23-Sep-14 16:10)  Authored: EKG , Radiology, Impression/Plan  Co-Signer: General Aspect/Present Illness, Home Medications, Labs, EKG , Allergies, Vital Signs/Nurse's Notes, Impression/Plan   Last Updated: 23-Sep-14 16:10 by Julien NordmannGollan, Timothy (MD)

## 2014-09-11 NOTE — Discharge Summary (Signed)
PATIENT NAME:  Andrew [name], Andrew [name] MR#:  [id number] DATE OF BIRTH:  Nov 26, 1950  DATE OF ADMISSION:  02/21/2013 DATE OF DISCHARGE:   02/23/2013  ADMISSION DIAGNOSIS:  Chest pressure.   DISCHARGE DIAGNOSES: 1.  Chest pressure.  2.  End-stage renal disease, on hemodialysis.  3.  History of hyperlipidemia.   CONSULTATIONS:  1.  Cardiology [place] 2.  Dr. Cherylann RatelLateef.   LABORATORIES AT DISCHARGE: Sodium 138, potassium 4.4, chloride 100, bicarbonate 30, BUN 32, creatinine 7.57, glucose 102. Troponins were negative. White blood cells 9.5, hemoglobin 9.2, hematocrit 27, platelets 254.   HOSPITAL COURSE: A 64 year old male who was recently discharged from the hospital who presented with chest pressure while at dialysis.  For further details, please refer to Dr. Darnelle BosSheyang Patel's H and P.   1.  Chest pressure. The patient was admitted to telemetry. He had no acute events on telemetry. His troponins were all negative. He did have consultation with cardiology. The patient will need stress testing, which will be performed as an outpatient if his stress test that he says he had three months ago cannot be found.  2.  End-stage renal disease, on hemodialysis. The patient did have dialysis during this hospitalization.  3.  Conjunctivitis. The patient will continue his eyedrops.  4.  Obesity. The patient walks with a walker. He does not exercising. He is encouraged to lose weight with diet and exercise. This should be addressed at his PCP.  5.  Hyperlipidemia. The patient will continue on his atorvastatin.   MEDICATIONS:  1.  Darvocet 10 mg daily.  2.  Tylenol 325, 2 tablets [name].i.d. for pain.  3.  Omeprazole 20 mg daily.  4.  Aspirin 81 mg daily.  5.  Docusate and senna 1 tablet q. 12.  6.  MiraLax 17 grams [name].i.d. p.r.n.  7.  Midodrine 5 mg 3 tablets orally 3 times a day.  8.  Oxybutynin 5 mg [name].i.d.  9.  PhosLo 667 mg 3 tablets t.i.d.  10.  Loratadine 1 tablet daily.  11.  Norco 5/325,  one tablet q. 4  hours p.r.n. pain.    12.  Ciprofloxacin 2 drops to each affected eye 6 times a day or every 2 hours while awake.   DISCHARGE DIET:  Renal diet.   DISCHARGE ACTIVITY: As tolerated.   DISCHARGE FOLLOWUP: The patient will need to follow up with Dr. Maryellen PileEason 1 week.   TIME SPENT: 35 minutes. The patient medically stable for discharge.    ____________________________ Roya Gieselman P. Juliene PinaMody, MD spm:dp D: 02/22/2013 12:51:58 ET T: 02/22/2013 13:13:12 ET JOB#: [id number]  cc: Tavari Loadholt P. Juliene PinaMody, MD, <Dictator> [name]. Maryellen PileEason, MD  Janyth ContesSITAL P Yair Dusza MD ELECTRONICALLY SIGNED 02/22/2013 21:49

## 2014-09-11 NOTE — Discharge Summary (Signed)
PATIENT NAME:  LYNELL, KUSSMAN MR#:  [id number] DATE OF BIRTH:  1951/02/22  DATE OF ADMISSION:  03/19/2013 DATE OF DISCHARGE:  03/23/2013  DISCHARGE DIAGNOSES: 1.  Cystitis with severe sepsis.  2.  End-stage renal disease, on hemodialysis.  3.  Bladder spasms.  4.  Gastroesophageal reflux disease.    5.  History of atrial fibrillation.   IMAGING STUDIES DONE: Include:  1.  A CT scan of the chest, abdomen and pelvis which showed cystitis, calcified gallstones without cholecystitis.  2.  Chest x-ray showed shallow inspiration, mild edema.  3.  Ultrasound of left upper extremity showed some subcutaneous edema. No abscess or fluid correction around the arteriovenous graft fistula.   CONSULTS:  1.  Dr. Cherylann Ratel with nephrology.  2.  Dr. Richardson Landry of urology.   ADMITTING HISTORY AND PHYSICAL: Please see detailed H and P dictated previously by Dr. Cherlynn Kaiser. In brief, this is a 64 year old, morbidly obese African American male patient with history of end-stage renal disease, prior cystitis, admitted to the hospitalist service after the patient had chills, fever and hypotension while getting his dialysis. The patient had recurrent fevers in the Emergency Room, was hypotensive into the systolic of 80s.   HOSPITAL COURSE:  Severe sepsis with cystitis. The patient was started on broad-spectrum antibiotics with Zosyn and vancomycin. Blood cultures were drawn. The patient did spike fevers for 48 hours, after which he been afebrile for over 48 hours now. Blood cultures have been negative. The patient does not make any urine and urine cultures could not be obtained but a CT scan of the abdomen was done which showed cystitis. Urology was consulted who suggested treating the acute episode and then putting the patient on suppressive therapy for at least 60 days. The patient will be on ciprofloxacin for 6 more days at therapeutic dose, after which we will dose him at 250 mg on dialysis days for 8 more weeks.   The  patient's hemodialysis was continued in the hospital with nephrology following him. His bladder spasms have resolved.   Today on examination, the patient does not have any abdominal tenderness. Cardiac examination shows S1, S2, without any murmurs. No edema. The patient feels well. He will be discharged back home.   DISCHARGE MEDICATIONS: Include:  1.  Atorvastatin 10 mg oral once a day.  2.  Tylenol 650 mg oral 2 times a day as needed for fever or pain.  3.  Omeprazole 20 mg oral once a day.  4.  Aspirin 81 mg daily.  5.  Docusate senna 1 tablet oral 2 times a day.  6.  MiraLax 17 grams oral 2 times a day as needed for constipation.  7.  Midodrine 15 mg oral 3 times a day.  8.  Oxybutynin 5 mg oral 2 times a day.  9.  PhosLo 667 mg 3 capsules oral 3 times a day.  10.  Loratadine 10 mg oral once a day.  11.  Norco 325/5, one tablet oral every 4 hours as needed for pain.  12.  Ocular lubricant 2 drops to each affected eye every 4 hours as needed for 5 days.  13.  Ciprofloxacin 250 mg once a day for 6 days then 3 times a week, 1 tablet after dialysis for 8 weeks.   DISCHARGE INSTRUCTIONS: Follow up with primary care physician in 1 to 2 weeks. Renal diet of regular consistency. Activity as tolerated. Two liters oxygen continuous.   TIME SPENT: On day of discharge in discharge activity  was 42 minutes.    ____________________________ Molinda BailiffSrikar R. Rishik Tubby, MD srs:cs D: 03/23/2013 12:29:29 ET T: 03/23/2013 18:55:10 ET JOB#: [id number]  cc: Wardell HeathSrikar R. Maleeyah Mccaughey, MD, <Dictator> Orie FishermanSRIKAR R Elisa Sorlie MD ELECTRONICALLY SIGNED 03/31/2013 15:01

## 2014-09-11 NOTE — H&P (Signed)
PATIENT NAME:  Andrew [name], Andrew [name] MR#:  [id number] DATE OF BIRTH:  [birth year]  DATE OF ADMISSION:  09/16/2012  PRIMARY CARE PHYSICIAN: Evelene Croon, MD   CHIEF COMPLAINT: Shortness of breath.   HISTORY OF PRESENT ILLNESS: The patient is a 64 year old male patient with history of end-stage renal disease on hemodialysis, benign prostatic hypertrophy, diastolic congestive heart failure and cerebral palsy, presents to the Emergency Room complaining of acute onset of shortness of breath and weakness. The patient had a large salty barbecue diet multiple times over the weekend along with increased fluid intake. In the Emergency Room, he was found to have pulmonary edema and severe hypokalemia and is being admitted to the hospitalist service for further work-up and treatment. The patient complains of some nausea, no vomiting, no abdominal pain. He receives his dialysis on Monday, Wednesday and Friday. I discussed the case with Dr. Cherylann Ratel, and he will need urgent dialysis.   PAST MEDICAL HISTORY: 1.  Benign prostatic hypertrophy.  2.  End-stage renal disease on hemodialysis on Monday, Wednesday, Friday.  3.  Hypertension.  4.  Diastolic congestive heart failure with normal ejection fraction.  5.  Cerebral palsy, walks with a walker.  6.  History of sarcoidosis.  7.  GERD.  8.  Infection of AV fistula in the past.   SOCIAL HISTORY: The patient does not smoke, drink alcohol or use illicit drugs. He lives at a group home.   ALLERGIES: No known drug allergies.   PAST SURGICAL HISTORY:  1.  AV fistula.  2.  PermCath placement in 2013.   REVIEW OF SYSTEMS:  CONSTITUTIONAL: Complains of fatigue, weight gain. No weight loss.  EYES: No blurred vision, pain, redness.  EARS, NOSE AND THROAT: No tinnitus, ear pain, hearing loss.  RESPIRATORY: Shortness of breath, conversational dyspnea and dry cough.  CARDIOVASCULAR: No chest pain, has orthopnea, edema.  GASTROINTESTINAL: Had nausea, no vomiting, no   diarrhea or abdominal pain.  GENITOURINARY: No dysuria, hematuria, frequency.  ENDOCRINE: No polyuria, polydipsia, thyroid problems.  HEMATOLOGIC/LYMPHATIC: Has anemia of chronic disease. No easy bruising, bleeding.  INTEGUMENTARY: No acne, rash, lesions.  NEUROLOGICAL: Has cerebral palsy.  PSYCHIATRIC: No anxiety or depression.   HOME MEDICATIONS: 1.  Benadryl 25 mg oral 2 times a day.  2.  Docusate sodium 100 mg 2 capsules oral once a day as needed.  3.  Lipitor 10 mg oral once a day.  4.  Omeprazole 20 mg oral once a day.  5.  Vitamin D3 5000 international units oral once a day.   PHYSICAL EXAMINATION: VITAL SIGNS: Temperature 98.2, pulse of 62, respirations 19, blood pressure 148/73, saturating 92% on 3 liters oxygen.  GENERAL: Morbidly obese African American male patient lying in bed in respiratory distress.  PSYCHIATRIC: Alert and oriented x 3. Mood and affect are appropriate. Judgment is intact.  HEENT: Atraumatic, normocephalic. Oral mucosa moist and pink. External ears and nose normal. Pallor positive. No icterus. Pupils reacting to light.  NECK: Supple. No thyromegaly. No palpable lymph nodes. Trachea midline. No carotid bruit or JVD.  CARDIOVASCULAR: S1 and S2 with any murmurs, 1+ edema.  RESPIRATORY: Increased work of breathing, using accessory muscles. Bilateral basal crackles.  GASTROINTESTINAL: Soft abdomen, nontender. Bowel sounds present. No hepatosplenomegaly palpable.  GENITOURINARY: No CVA tenderness or bladder distention.  SKIN: Warm and dry. No petechiae, rash or ulcers.  MUSCULOSKELETAL: No joint swelling, redness, effusion of the large joints. Normal muscle tone.  NEUROLOGICAL: Follows commands. No tremor or rigidity. Cranial nerves II  through XII are intact.   LABORATORY AND RADIOLOGICAL DATA:  Glucose of 109, BUN 88, creatinine 15.3, sodium 138, potassium of 6.6, chloride 101, magnesium 1.6, albumin 3.5. Troponin less than 0.02. CK of 114. WBC 6.9, hemoglobin  10.5, platelets 168. INR 1.0.  Chest x-ray shows pulmonary edema.   ASSESSMENT AND PLAN: 1.  Acute pulmonary edema secondary to dietary indiscretion with a salty diet and extra fluids: The patient will need urgent hemodialysis with his associated acute respiratory failure. He has history of diastolic CHF. I discussed the case with Dr. Cherylann RatelLateef.  The patient will get urgent dialysis.  2.  Hyperkalemia secondary to end-stage renal disease, severe: High risk for cardiac arrest.  The patient will be going for urgent hemodialysis. Low potassium diet.  3.  Anemia of chronic disease with hemoglobin 10.5: Stable.  4.  Diastolic congestive heart failure, acute on chronic, secondary to increased salt intake: Should improve with dialysis.  5.  Deep vein thrombosis prophylaxis with heparin.   CODE STATUS: FULL CODE.    TIME SPENT: Today on this critically ill case with acute pulmonary edema, hyperkalemia, going to urgent dialysis is 42 minutes.   ____________________________ Molinda BailiffSrikar R. Faatimah Spielberg, MD srs:cb D: 09/16/2012 14:46:49 ET T: 09/16/2012 14:56:59 ET JOB#: [id number]  cc: Wardell HeathSrikar R. Shaquandra Galano, MD, <Dictator> Meindert A. Lacie ScottsNiemeyer, MD Orie FishermanSRIKAR R Onofrio Klemp MD ELECTRONICALLY SIGNED 10/02/2012 13:53

## 2014-09-11 NOTE — Consult Note (Signed)
PATIENT NAME:  Andrew [name], Andrew [name], Andrew [name]  DATE OF CONSULTATION:  03/22/2013  REFERRING PHYSICIAN:  Dr. Elpidio AnisSudini CONSULTING PHYSICIAN:  Tawni MillersEdward R. Weldon InchesHouser, II, MD  REASON FOR CONSULTATION:  Cystitis and bladder spasms.   HISTORY OF PRESENT ILLNESS:  Mr. Andrew [name] is admitted 03/19/2013 to the internal medicine service.  He is a 64 year old African American male with end-stage renal disease who is hemodialysis dependent and makes very little urine and "never voids."  He is admitted with chills, belly pain and bladder spasms after finishing dialysis.  On the day of admission he had blood cultures drawn at his dialysis center where he was given vancomycin and ceftazidime there, was sent to the ER where he subsequently spiked to 102 and 103, was found to be hypotensive with a blood pressure in the 80s.  He subsequently admitted for evaluation and treatment.  He has persistent although decreasing bladder spasms since admission.  The blood pressure has normalized.  He was never started on pressor support.  Blood cultures have been negative to date.  The patient has no other relevant HPI on review.   REVIEW OF SYSTEMS:  The patient's balance of 12 systems is negative other than outlined in the HPI.   ALLERGIES:  TYLENOL WITH CODEINE.   PAST MEDICAL HISTORY:  End-stage renal disease, hemodialysis, obstructive sleep apnea, pneumonia, sepsis and paroxysmal A-Fib.  He has gastroesophageal reflux disease and a history of bladder spasms.   SOCIAL HISTORY:  Nonsmoking, nondrinking, lives in [facility].  He is a Education officer, environmentalpastor as well.    FAMILY HISTORY:  Noncontributory.   OUTPATIENT MEDICATIONS:  Up-to-date and reviewed in detail in the chart.   PHYSICAL EXAMINATION: VITAL SIGNS:  Recent vitals are as follows:  Temperature 98.1, pulse 86, respiratory rate 20, blood pressure 120/76, sating 89% on nasal cannula.  GENERAL:  Alert and oriented x 3,  conversational, interactive, obese, African American male.  HEENT:  Normocephalic, atraumatic.  NECK:  Supple without any lymphadenopathy.  CHEST:  Has normal respiratory effort.  Clear anterior.  CARDIOVASCULAR:  Regular rate and rhythm.   ABDOMEN:  Soft, nontender, nondistended.  He has no palpable bladder.  GENITOURINARY:  He has normal penis and bilateral descended testes.  EXTREMITIES:  No cyanosis, clubbing or edema.  SKIN:  Warm, dry.  No rash.  NEUROLOGIC:  Grossly intact.  MUSCULOSKELETAL:  Five out of five strength.  PSYCHIATRIC:  Appropriate.   LABORATORY VALUES:  Microbiology, blood cultures no growth at 24 hours.  His creatinine is 8.2 currently.   IMAGING STUDIES:  CT scan up to date and reviewed by me shows no GU abnormalities and no fluid within the bladder.   ASSESSMENT:  A 64 year old African American male who is status post history of end-stage renal disease on hemodialysis who makes very little urine.  He has recurrence of cystitis and bladder spasms.  Urology was asked to evaluate in terms of potential treatment to prevent cystitis going forward and regarding his bladder spasms.  The patient does not have a significant amount of fluid within the bladder.  He has had catheters placed at his skilled nursing facility with no output.  I do not think intermittent catheterization or indwelling Foley would be appropriate or would help in this case.  It may be in his best interest to consider suppressive antibiotics since this is his second bout of cystitis recently.  Additionally, could consider increasing his oxybutynin dosage at baseline as  well as consider belladonna and opioid suppositories for severe bladder spasms.   RECOMMENDATIONS ARE AS FOLLOWS:  1.  Continue IV antibiotics per primary team.  2.  After antibiotic course is over and the patient returns to the skilled nursing facility could consider suppressive therapy for cystitis with trimethoprim 100 mg daily or  ciprofloxacin 250 mg on his dialysis days or 3 times a week.  The patient also consider probiotic use as well.  3.  In regards to his bladder spasms, increase in his oxybutynin at baseline or treating him as needed for severe spasms with belladonna and opioid suppositories would be warranted.  4.  The patient also should have outpatient follow-up with local urologist.   This plan was communicated with Dr. Elpidio Anis.     ____________________________ Tawni Millers. Weldon Inches, MD erh:ea D: 03/22/2013 22:48:49 ET T: 03/23/2013 00:18:46 ET JOB#: [id number]  cc: Tawni Millers. Weldon Inches, MD, <Dictator> Beaulah Corin MD ELECTRONICALLY SIGNED 03/23/2013 17:18

## 2014-09-12 NOTE — Op Note (Signed)
PATIENT NAME:  Andrew [name], Luman B MR#:  [id number] DATE OF BIRTH:  Jun 25, 1950  DATE OF PROCEDURE:  09/11/2013  DATE OF DICTATION: 09/11/2013   PREOPERATIVE DIAGNOSES:  1. End-stage renal disease.  2. Poorly functioning left arm arteriovenous fistula.  3. Morbid obesity.   PROCEDURES:  1. Ultrasound guidance for vascular access to left arm arteriovenous fistula.  2. Left upper extremity fistulogram and central venogram.  3. Percutaneous transluminal angioplasty of peri-anastomotic stenosis with 6 and 7 mm high-pressure angioplasty balloon and 8 mm diameter conventional angioplasty balloon.  4. Percutaneous transluminal angioplasty of cephalic vein near subclavian vein confluence with 6 and 7 mm diameter high-pressure angioplasty balloons.   SURGEON: Annice NeedyJason S. Aldan Camey, MD  ANESTHESIA: Local with moderate conscious sedation.   ESTIMATED BLOOD LOSS: 25 mL.   FLUOROSCOPY TIME: 5 minutes.   CONTRAST USED: 65 mL contrast.   INDICATION FOR PROCEDURE: A 64 year old African-American male with end-stage renal disease, well known to our service. His dialysis access flows are markedly diminished, and we are asked to assess this. Fistulogram is to be performed.   DESCRIPTION OF PROCEDURE: The patient was brought to the vascular interventional radiology suite. Left upper extremity was sterilely prepped and draped, and a sterile surgical field was created. The fistula was accessed in the mid upper arm under direct ultrasound guidance, initially in a retrograde direction under direct ultrasound guidance with a micropuncture needle, and a permanent image was recorded. A micropuncture wire and sheath were then placed and upsized to a 6 [place] sheath. Imaging showed severe intimal hyperplasia with near-occlusive stenosis over a several centimeter span in the peri-anastomotic cephalic vein. Flow was quite sluggish, and initially the central venous portions could not be identified due to the sluggish flow. The  lesion was crossed with a Magic Torque wire. I treated this initially with a 6 mm diameter high-pressure angioplasty balloon with improvement. A 7 mm diameter high-pressure and then an 8 mm diameter conventional angioplasty balloon were used for this peri-anastomotic stenosis. Following this, there was a significant improvement with much more brisk flow. There was about a 30% residual stenosis, but at this point, I did not want to go with any larger balloon. Imaging was then performed to evaluate the rest of the upper arm and the central venous circulation. There was another near-occlusive stenosis in a tortuous cephalic vein prior to its draining into the subclavian vein. This stenosis was in the 85% to 90% range. I was able to flip the sheath and advance a Magic Torque wire now in an antegrade direction without having to re-access across the lesion with a Kumpe catheter and Magic Torque wire without difficulty. I then treated this lesion. A 6 mm diameter high-pressure angioplasty balloon was initially inflated, with improvement. I then upsized to a 7 mm diameter angioplasty balloon, and at this point, there was significantly improved flow, and it was much more brisk. The residual stenosis at this point appeared to be in the 20% to 25% range, and I elected to terminate the procedure. The sheath was removed. A 4-0 pursestring suture was placed, and pressure was held. Sterile dressing was placed. The patient tolerated the procedure well and was taken to the recovery room in stable condition.     ____________________________ Annice NeedyJason S. Lumina Gitto, MD jsd:lb D: 09/11/2013 11:11:39 ET T: 09/11/2013 13:10:10 ET JOB#: [id number]  cc: Annice NeedyJason S. Braylon Lemmons, MD, <Dictator> Annice NeedyJASON S Rashard Ryle MD ELECTRONICALLY SIGNED 09/15/2013 9:37

## 2014-09-12 NOTE — H&P (Signed)
PATIENT NAME:  Andrew [name], [name] MR#:  [id number] DATE OF BIRTH:  1951/03/04  DATE OF ADMISSION:  05/04/2014  PRIMARY CARE PHYSICIAN:  Dr. Maryellen PileEason.    NEPHROLOGIST:  [facility] [address].   CHIEF COMPLAINT: Sent in for low heart rate from the dialysis center.   HISTORY OF PRESENT ILLNESS: This is a 64 year old man who has been on dialysis for 4  years. He felt a little woozy this morning, went to the dialysis center, they saw his pulse was low and sent him to the ER for further evaluation. He developed some tightness in his chest about 4-5 out of 10 in intensity with some shortness of breath. No nausea. In the ER he was found to have hyperkalemia with a potassium of 6.4. Hospitalist services were contacted for further evaluation when his heart rate was in the 30s on the EKG with bifascicular block, sinus bradycardia.   PAST MEDICAL HISTORY: Sleep apnea on oxygen at night, end-stage renal disease on dialysis for 4 years, COPD, congestive heart failure, atrial fibrillation.   PAST SURGICAL HISTORY: Numerous vascular procedures for dialysis access.   ALLERGIES: CODEINE.    MEDICATION LIST: Still incomplete at this point, we are waiting for medication list to come over from the dialysis center.    SOCIAL HISTORY: No smoking. No alcohol. No drug use. Is on disability.   FAMILY HISTORY: Father with heart disease. Mother with heart disease, end-stage renal disease.   REVIEW OF SYSTEMS:   CONSTITUTIONAL: Positive for sweats. No fever or chills. Positive for weakness.  EYES: Does have reading glasses and also astigmatism.  EARS, NOSE, MOUTH, AND THROAT: No hearing loss. No sore throat. No difficulty swallowing.  CARDIOVASCULAR: Positive for chest tightness.  RESPIRATORY: Positive for shortness of breath. No cough. No sputum. No hemoptysis.  GASTROINTESTINAL: Positive for constipation, occasional blood with straining. No nausea. No vomiting. No abdominal pain.   GENITOURINARY: Urinates very little. No burning on urination.  INTEGUMENT: No rashes or eruptions.  MUSCULOSKELETAL: No joint pain.  NEUROLOGIC: No fainting or blackouts.  PSYCHIATRIC: No anxiety or depression.  ENDOCRINE: No thyroid problems.  HEMATOLOGIC AND LYMPHATIC: No anemia.   PHYSICAL EXAMINATION:  VITAL SIGNS: Temperature 97.9, pulse 44, respirations 18, blood pressure 93/75, pulse oximetry 94% on oxygen.  GENERAL: No respiratory distress.  EYES: Conjunctivae and lids normal. Pupils equal, round, and reactive to light. Extraocular muscles intact. No nystagmus.  EARS, NOSE, MOUTH, AND THROAT: Tympanic membranes, no erythema. Nasal mucosa, no erythema. Throat, no erythema. No exudate seen. Lips and gums, no lesions.  NECK: No JVD. No bruits. No lymphadenopathy. No thyromegaly. No thyroid nodules palpated.  LUNGS: Clear to auscultation. No use of accessory muscles to breathe. No rhonchi, rales, or wheeze heard.  CARDIOVASCULAR: S1, S2 normal. No gallops, rubs, or murmurs heard. Carotid upstroke 2 + bilaterally. No bruits. Dorsalis pedis pulses 2 + bilaterally. The patient is bradycardic. Trace edema of the lower extremity.  ABDOMEN: Soft, nontender. No organomegaly/splenomegaly. Normoactive bowel sounds. No masses felt.  LYMPHATIC: No lymph nodes in the neck.  MUSCULOSKELETAL: No clubbing. Trace edema. No cyanosis.  SKIN: No ulcers or lesions seen.  NEUROLOGIC: Cranial nerves II through XII grossly intact. Deep tendon reflexes 2 + bilateral lower extremities.  PSYCHIATRIC: The patient is oriented to person, place, and time.   LABORATORY AND RADIOLOGICAL DATA:  Chest x-ray, mild vascular congestion unchanged, atelectasis unchanged. Glucose 86, BUN 54, creatinine 11.27, sodium 132, potassium 6.4, chloride 94, CO2  of 29, calcium 9.0. White blood cell count 7.2, H and H 10.5 and 32.5, platelet count of 289,000. Magnesium 2.1. INR 1.0.  Troponin negative.    ASSESSMENT AND PLAN:  1.   Symptomatic bradycardia with wooziness, chest pain, and shortness of breath. We will admit to CCU stepdown with his blood pressure on the lower side. Still waiting for medication list to make sure he is not on any rate controlling medications at this point. Continue to monitor. I will get a cardiology consult. Serial cardiac enzymes. Given aspirin, p.r.n., nitroglycerin. Will hold medications that can lower blood pressure at this point too.  2.  Hypokalemia with end-stage renal disease. We will speak with Dr. Thedore Mins, nephrology for dialysis today.  3.  Sleep apnea, oxygen at night.  4.  History of congestive heart failure. No clinical signs at this point.  5.  History of atrial fibrillation. Still waiting for medication list.    TIME SPENT ON ADMISSION: 55 minutes.   The patient will be admitted to the CCU stepdown.    ____________________________ Herschell Dimes. Renae Gloss, MD rjw:bu D: 05/04/2014 14:54:07 ET T: 05/04/2014 15:18:12 ET JOB#: [id number]  cc: Herschell Dimes. Renae Gloss, MD, <Dictator> Serita Sheller. Maryellen Pile, MD [facility] on [address] Salley Scarlet MD ELECTRONICALLY SIGNED 05/13/2014 15:44

## 2014-09-12 NOTE — Discharge Summary (Signed)
PATIENT NAME:  Andrew [name], STOCK MR#:  [id number] DATE OF BIRTH:  [birth year]  DATE OF ADMISSION:  05/04/2014 DATE OF DISCHARGE:    DISCHARGE DIAGNOSES:  1.  Bradycardia due to hyperkalemia.  2.  Hyperkalemia.  3.  End-stage renal disease on hemodialysis.  4.  Obstructive sleep apnea on oxygen at night.  5.  Atrial fibrillation.  6.  Chronic obstructive pulmonary disease.  7.  Congestive heart failure.   CONSULTATIONS:  1.  Dr. Mosetta Pigeon, nephrology.  2.  Dr. Julien Nordmann, cardiology.   PROCEDURES:  1.  Emergent hemodialysis performed 05/04/2014.  2.  Chest x-ray December 14 shows mild vascular congestion unchanged from prior. Mild bibasilar atelectasis also unchanged from prior.   HISTORY OF PRESENT ILLNESS: This 64 year old man on dialysis for the past 4 years presents from dialysis center. He felt slightly woozy this morning during dialysis, his pulse was checked and it was found to be low. He was sent to the Emergency Room for further evaluation. On presentation he is found to be hypokalemic high with a potassium of 6.4 and a heart rate in the 30s. EKG showed a bifascicular block with sinus bradycardia.   HOSPITAL COURSE BY PROBLEM:  1.  Bradycardia: The patient was followed by cardiology throughout this admission. Bradycardia is most likely due to hyperkalemia. After emergent hemodialysis potassium decreased and the patient has been in normal sinus rhythm with heart rate in the 50s-60s after this. He is not on any rate limiting medications. He will follow up with his primary care physician in the next week to further evaluate his bradycardia, but at this time he is not symptomatic even when heart rates are in the mid 50s.  2.  End-stage renal disease on hemodialysis Monday, Wednesday, and Friday: He will continue with his previous schedule.  3.  Possible history of atrial fibrillation or atrial flutter:  The patient is currently in normal sinus rhythm. He will schedule an  outpatient monitor to evaluate this history. This appointment will be made before discharge. If he does have atrial fibrillation or flutter he will require long-term anticoagulation. He is not anticoagulated at this time.  4.  Obstructive sleep apnea: He is on oxygen at night and will continue with this.  5.  COPD: His respiratory status is stable.  6.  Lower extremity weakness: This is chronic and unchanged. The patient is usually wheelchair-bound.  7.  Hyperkalemia: This is due to end-stage renal disease. Presenting potassium was 6.4. On discharge potassium is 4.7. He will continue to be monitored for this issue at hemodialysis sessions.   CONDITION ON DISCHARGE: Stable.   DISCHARGE PHYSICAL EXAMINATION:  VITAL SIGNS: Temperature 98.5, pulse 57, respirations 16, blood pressure 125/66, oxygenation 96% on 4 liters which is his baseline.  GENERAL: No acute distress.  CARDIOVASCULAR:  Regular, bradycardic, no murmurs, rubs, or gallops, trace pedal edema bilaterally, peripheral pulses 1+.  RESPIRATORY: Lungs clear to auscultation bilaterally with good air movement, no respiratory distress.  ABDOMEN: Soft, nontender, nondistended, obese, bowel sounds are normal.  PSYCHIATRIC: The patient is alert and oriented x 4 with good insight into his clinical condition.   LABORATORY DATA: Sodium 136, potassium 4.7, chloride 97, BUN 25, creatinine 7.21, glucose 94. LFTs are normal. White blood cell count 4.5, hemoglobin 10.3, platelets 237,000, MCV is 94. Troponins have been negative x 3.   DISPOSITION: The patient is being discharged back to his prior living situation. He lives at [facility].   DISCHARGE MEDICATIONS:  1. Omeprazole 20 mg 1 tablet once a day.  2. Aspirin 81 mg 1 tablet once a day.  3. Norco 325-5 mg 1 tablet every 4 hours as needed for pain.  4. Cinacalcet 30 mg 1 tablet once a day.  5. MiraLax 17 grams every 6 hours as needed for constipation.  6. Oxybutynin extended  release 5 mg 1 tablet 2 times a day.   7. Acetaminophen 325 mg 2 tablets twice a day.  8. Albuterol 2.5 mg/3 mL inhaled solution inhaled every 4 hours as needed for shortness of breath or wheezing.  9. Hydroxyzine hydrochloride 25 mg 1 tablet 3 times a day.  10. Lopid 600 mg 1 tablet 2 times a day.  11. Midodrine 5 mg 1 tablet 2 times a day on Monday, Wednesday, and Friday and 3 tablets 3 times a day on Sunday, Tuesday, Thursday, and Saturday.  12. Renvela carbonate 800 mg 2 tablets 3 times a day with meals.  13. Senna S 50 mg-8.6 mg 1 tablet twice a day.  14. Gemfibrozil 600 mg 1 tablet 2 times a day before meals.   DISCHARGE INSTRUCTIONS:   DIET: Renal diet.   ACTIVITY: No restrictions, activity as tolerated.   TIMEFRAME FOR FOLLOWUP: Please follow up with Dr. Maryellen PileEason within 1 week. Continue dialysis as per prior schedule.   TIME SPENT ON DISCHARGE: 40 minutes.    ____________________________ Ena Dawleyatherine P. Clent RidgesWalsh, MD cpw:bu D: 05/05/2014 14:04:16 ET T: 05/05/2014 14:30:28 ET JOB#: [id number]  cc: Santina Evansatherine P. Clent RidgesWalsh, MD, <Dictator> Gale JourneyATHERINE P WALSH MD ELECTRONICALLY SIGNED 05/13/2014 9:11

## 2014-09-12 NOTE — Op Note (Signed)
PATIENT NAME:  Andrew [name], Andrew [name] MR#:  [id number] DATE OF BIRTH:  07-18-1950  DATE OF PROCEDURE:  11/27/2013  PREOPERATIVE DIAGNOSES:  1.  End-stage renal disease.  2.  Poorly functioning left arm arteriovenous fistula.  3.  Obesity.  4.  Chronic obstructive pulmonary disease.  5.  Congestive heart failure.   POSTOPERATIVE DIAGNOSES: 1.  End-stage renal disease.  2.  Poorly functioning left arm arteriovenous fistula.  3.  Obesity.  4.  Chronic obstructive pulmonary disease.  5.  Congestive heart failure.   PROCEDURES:  1.  Ultrasound guidance for vascular access to left brachiocephalic arteriovenous fistula.  2.  Left upper extremity fistulogram and central venogram.  3.  Percutaneous transluminal angioplasty of what was in the location of the cephalic vein and subclavian vein confluence, with 5 mm diameter drug-coated angioplasty balloon and 6 mm diameter high-pressure angioplasty balloon.   SURGEON: Annice NeedyJason S. Dew, M.D.   ANESTHESIA: Local with moderate conscious sedation.   ESTIMATED BLOOD LOSS: Minimal.   FLUOROSCOPY TIME:  Two minutes.   CONTRAST USED:  30 mL.   INDICATION FOR PROCEDURE: A 64 year old African American male with end-stage renal disease. His flows are down in his left arm arteriovenous fistula and noninvasive study showed stenosis near the shoulder, as well as some questionable stenosis in the peri-anastomotic region. He is brought in for a fistulogram for further evaluation. Risks and benefits were discussed. Informed consent was obtained.   DESCRIPTION OF PROCEDURE: The patient is brought to the vascular suite. The left upper extremity was sterilely prepped and draped, and a sterile surgical field was created. The fistula was accessed in the mid upper arm under direct ultrasound guidance without difficulty with a micropuncture needle.  Micropuncture wire and sheath were then placed and we upsized to a 6 JamaicaFrench sheath. The patient was given 3000 units of  intravenous heparin. Imaging performed  showed high-grade stenosis in what appeared to be the cephalic vein joining a jugular collateral to the subclavian vein. This had the appearance of an external jugular vein. This may have just been a tortuous cephalic vein that drained more centrally than typical, but either way, the outflow was significantly stenosed. The remainder of the fistula had reasonably good patency and there was about a 50% stenosis in the peri-anastomotic region when compression on the fistula was performed. I crossed the lesion without difficulty with a Magic torque wire and a Kumpe catheter. I treated the middle lesion initially with a 5 mm diameter x 10 cm weak Lutonix drug-coated angioplasty balloon and then upsized to a 6 mm in diameter x 8 cm length high-pressure angioplasty balloon. Following this, there was markedly improved flow with only about a 20% residual stenosis and I elected to terminate the procedure. The sheath was removed, 4-0 Monocryl pursestring suture was placed. Pressure was held. Sterile dressing was placed. The patient tolerated the procedure well and was taken to the recovery room in stable condition.     ____________________________ Annice NeedyJason S. Dew, MD jsd:ts D: 11/27/2013 11:33:54 ET T: 11/27/2013 11:44:03 ET JOB#: [id number]  cc: Annice NeedyJason S. Dew, MD, <Dictator> Laverda SorensonSarath C. Kolluru, MD [name]. Maryellen PileEason, MD Annice NeedyJASON S DEW MD ELECTRONICALLY SIGNED 12/25/2013 12:39

## 2014-09-12 NOTE — Op Note (Signed)
PATIENT NAME:  Andrew [name], Andrew [name] MR#:  [id number] DATE OF BIRTH:  [birth year]  DATE OF PROCEDURE:  07/15/2013  PREOPERATIVE DIAGNOSES: 1.  End-stage renal disease requiring hemodialysis.  2.  Poorly functioning dialysis access with increased bleeding times and increasing recirculation.  POSTOPERATIVE DIAGNOSES: 1.  End-stage renal disease requiring hemodialysis.  2.  Poorly functioning dialysis access with increased bleeding times and increasing recirculation.  PROCEDURES PERFORMED: 1.  Contrast injection, left brachiocephalic fistula.  2.  Percutaneous transluminal angioplasty to 8 mm, left cephalic vein.  3.  Percutaneous transluminal angioplasty to 14 mm, left subclavian vein.   SURGEON: Renford Dills, M.D.   SEDATION:  Versed 4 mg plus fentanyl 100 mcg administered IV. Continuous ECG, pulse oximetry and cardiopulmonary monitoring is performed throughout the entire procedure by the interventional radiology nurse.  TOTAL SEDATION TIME:  50 minutes.   ACCESS: A 7-French sheath, left arm brachial brachiocephalic fistula, antegrade direction.   CONTRAST USED:  Isovue 30 mL.   FLUOROSCOPY TIME:  2.6 minutes.   INDICATIONS FOR PROCEDURE: Andrew [name] is a 64 year old gentleman who was referred by the dialysis center for increasing difficulties with his access. Risks and benefits for angiography and intervention were reviewed. All questions answered. The patient agrees to proceed.   DESCRIPTION OF PROCEDURE: The patient is taken to special procedures and placed in the supine position. After adequate sedation is achieved, he is positioned supine with his left arm extended palm upward. The left arm is then prepped and draped in a sterile fashion. Appropriate timeout is called.   Lidocaine 1% is infiltrated in the soft tissues overlying the fistula near the arterial anastomosis. In an antegrade direction, a micropuncture needle was inserted, microwire followed by micro sheath, J-wire  followed by a 6-French sheath. Hand injection of contrast is used to demonstrate the fistula, as well as the central veins. High-grade stricture stenosis is noted of the more proximal cephalic vein at the level of the deltoid. Also reflux of contrast up the jugular is also identified, which is usual. The innominate and superior vena cava appears patent. 3000 units of heparin is given and a Magic torque wire is then negotiated into the central venous system.   Initially, a 7 x 4 Rival balloon is advanced across the cephalic lesion. This does not meet profile at the rate of burst pressure is subsequently removed and an 8 x 4 Dorado balloon is advanced across this lesion and inflated to 20 atmospheres for 3 minutes. Followup imaging demonstrates there is resolution of the cephalic stricture at this level. The balloon is then advanced over the wire and used to check the subclavian and innominate at the confluence with the jugular given the abnormal reflux. Upon inflation of the 8 mm, there is a very distinct waist. Subsequently, a 10 x 4 balloon and ultimately a 14 x 2 balloon were required to treat the subclavian innominate lesion. This appears to be located right at the confluence with the jugular. After a 3-minute inflation with the 14 mm balloon to 20 atmospheres, there is now rapid flow of contrast and substantially less reflux in the jugular.   Pursestring suture is placed around the sheath. The sheath is removed, light pressure is held and there are no immediate complications.   INTERPRETATION: As noted the fistula itself appears widely patent. There is a focal napkin-like ring lesion associated with the cephalic vein toward the confluence but not at the confluence. There is also a central venous stenosis as described. After  inflation to 8 mm diameter in the cephalic and 14 in the subclavian, there is resolution of these lesions with a significant improvement in  flow.   ____________________________ Renford DillsGregory G. Royalti Schauf, MD ggs:ce D: 07/16/2013 16:44:59 ET T: 07/16/2013 18:09:35 ET JOB#: [id number]  cc: Renford DillsGregory G. Calypso Hagarty, MD, <Dictator> Renford DillsGREGORY G Xiara Knisley MD ELECTRONICALLY SIGNED 07/23/2013 11:02

## 2014-09-12 NOTE — Op Note (Signed)
PATIENT NAME:  Andrew [name], Andrew [name] MR#:  [id number] DATE OF BIRTH:  Jun 08, 1950  DATE OF PROCEDURE:  04/02/2014  PREOPERATIVE DIAGNOSES:  1.  End-stage renal disease.  2.  Nonfunctional jugular PermCath.   POSTOPERATIVE DIAGNOSES:  1.  End-stage renal disease.  2.  Nonfunctional jugular PermCath.   PROCEDURES:   1.  Removal and replacement through same venous access right jugular PermCath.  2.  Fluoroscopic guidance placement of catheter.   SURGEON: Annice NeedyJason S. Randi Poullard, MD.   ANESTHESIA: Local with moderate conscious sedation.   ESTIMATED BLOOD LOSS: Minimal.   INDICATION FOR PROCEDURE: A 64 year old gentleman well-known to us for his dialysis access needs. He has an AV fistula which occluded, [place] was in the hospital last month, but this could not be salvaged. He had a PermCath placed.  Now the PermCath is not working, we need to exchange this to get him a durable venous access for dialysis and then we will begin the process of vein mapping for new access following this. Risks and benefits were discussed. Informed consent was obtained.   DESCRIPTION OF PROCEDURE: The patient was brought to the vascular suite. Existing catheter, neck, and chest were sterilely prepped and draped and a sterile surgical field was created. I then placed a Amplatz wire through the existing catheter to hold the venous access. The area was anesthetized and this catheter was removed without difficulty with gentle traction and dissection around the cuff. Using fluoroscopic guidance I selected a 23 cm tip to cuff tunneled hemodialysis catheter, placed this over the wire, and parked the catheter tip just into the right atrium. It withdrew blood well and flushed easily with heparinized saline and a concentrated heparin solution was placed.   A 4-0 Monocryl pursestring suture was placed at the catheter exit site at the skin and it was secured to the chest wall with 2 Prolene  sutures. Sterile dressing was placed. The patient tolerated the procedure well.    ____________________________ Annice NeedyJason S. Agnieszka Newhouse, MD jsd:bu D: 04/02/2014 11:26:04 ET T: 04/02/2014 15:34:14 ET JOB#: [id number]  cc: Annice NeedyJason S. Franky Reier, MD, <Dictator> Annice NeedyJASON S Sherill Mangen MD ELECTRONICALLY SIGNED 04/06/2014 10:12

## 2014-09-12 NOTE — Consult Note (Signed)
General Aspect Primary Cardiologist: [facility]/ Dr. Rockey Situ, MD ____________________ 64 year old male with history of ESRD on HD (MWF), PAF/flutter, ventricular standstill with complete heart block in the setting of hyperkalemia and metabolic acidosis, OSA on O2 at night, and COPD who was admitted at [facility] 12/2013 with ventricular standstill with complete heart block requiring temporary venous pacer wire in the setting of hyperkalemia and metabolic acidosis presented to [facility] on 05/04/2014 with dizziness and chest pain while at the dialysis center. He was found to be bradycardic and set to [facility] for further evaluation.  __________________  PMH: 1. ESRD on HD 2. PAF/flutter 3. Ventricular standstill with complete heartblock in the setting of hyperkalemia and metabolic acidosis 4. OSA on O2 at night 5. COPD __________________   Present Illness 64 year old male with the above problem list who was admitted to [facility] on 05/04/2014 with with dizziness and chest pain while at the dialysis center.  He was found to be bradycardic and set to [facility] for further evaluation. He was recently admitted to [facility] in 12/2013 with ventricular standstill and complete heartblock in the setting of hyperkalemia and metabolic acidosis requiring temporary venous pacer wire. His K+ and metabolic acidosis were corrected resolving his cardiac symptoms. Echo on 01/07/14 showed EF 55-60%, unable to exclude RWMA, unable to evaluate diastolic function, mildly dilated LA. He was advised to follow up in [place], however he did not.   He comes in today with onset of dizziness and chest pain that began while at the dialysis center. He was sent to [facility] for further evaluation. Upon his arrival he was found to be bradycardic at 39. K+ was found to be 6.4. He reports that he does not miss any HD treatments but he does not always eat the healthiest diet. Weight has been stable. No increazsed dyspnea. Troponin negative x 3. He underwent emergent HD with  improvement of K+ to 4.7. He feels well currently. Heart rate has been in the 50s-60s.   Physical Exam:  GEN well developed, well nourished, no acute distress, obese   HEENT hearing intact to voice, moist oral mucosa   NECK supple   RESP normal resp effort  clear BS   CARD Regular rate and rhythm  Normal, S1, S2  No murmur   ABD denies tenderness  soft  obese   EXTR negative edema   SKIN normal to palpation   NEURO cranial nerves intact   PSYCH alert, A+O to time, place, person, good insight   Review of Systems:  Subjective/Chief Complaint Chest tightness and SOB, resolved   General: No Complaints   Skin: No Complaints   ENT: No Complaints   Eyes: No Complaints   Neck: No Complaints   Respiratory: No Complaints   Cardiovascular: Chest pain or discomfort   Gastrointestinal: No Complaints   Genitourinary: No Complaints   Vascular: No Complaints   Musculoskeletal: No Complaints   Neurologic: Dizzness   Hematologic: No Complaints   Endocrine: No Complaints   Psychiatric: No Complaints   Review of Systems: All other systems were reviewed and found to be negative   Medications/Allergies Reviewed Medications/Allergies reviewed   Family & Social History:  Family and Social History:  Family History Negative  Hypertension  Diabetes Mellitus   Social History positive ETOH, negative Illicit drugs   + Tobacco Prior (greater than 1 year)   Place of Living Nursing Home     Sleep Apnea:    COPD:    dialysis: monday, wednesday, friday  CHF:    chronic kidney disease:    gerd:    hyper lipidemia:    cerebral palsy:    sarcoidosis:    severe DJD:    hypertension:    obesity hypoventilation:    sleep apnea:          Admit Diagnosis:   SYMPTOMATIC BRADYCARDIA: Onset Date: 05-May-2014, Status: Active, Description: SYMPTOMATIC BRADYCARDIA  Home Medications: Medication Instructions Status  omeprazole 20 mg oral delayed release  capsule 1 cap(s) orally once a day Active  aspirin 81 mg oral delayed release tablet 1 tab(s) orally once a day Active  Norco 325 mg-5 mg oral tablet 1 tab(s) orally every 4 hours, As Needed - for Pain Active  cinacalcet 30 mg oral tablet 1 tab(s) orally once a day Active  MiraLax - oral powder for reconstitution 17 gram(s) orally every 6 hours, As Needed - for Constipation Active  oxybutynin extended release 5 mg/24 hours oral tablet, extended release 1 tab(s) orally 2 times a day Active  acetaminophen 325 mg oral tablet 2 tab(s) orally 2 times a day Active  albuterol 2.5 mg/3 mL (0.083%) inhalation solution 3 milliliter(s) inhaled every 4 hours, As Needed - for Shortness of Breath, for Wheezing  Active  hydrOXYzine hydrochloride 25 mg oral tablet 1 tab(s) orally 3 times a day Active  Lopid 600 mg oral tablet 1 tab(s) orally 2 times a day Active  midodrine 5 mg oral tablet 3 tab(s) orally 3 times a day on Sunday, Tuesday, Thursday, Saturday Active  midodrine 5 mg oral tablet 1 tab(s) orally 2 times a day on Monday, Wednesday, Friday Active  Renvela carbonate 800 mg oral tablet 2 tab(s) orally 3 times a day (with meals) Active  Senna S 50 mg-8.6 mg oral tablet 1 tab(s) orally 2 times a day Active   Lab Results:  Routine Chem:  14-Dec-15 17:28   Phosphorus, Serum  5.5 (Result(s) reported on 04 May 2014 at 06:06PM.)  15-Dec-15 04:09   Glucose, Serum 94  BUN  25  Creatinine (comp)  7.21  Sodium, Serum 136  Potassium, Serum 4.7  Chloride, Serum  97  CO2, Serum 31  Calcium (Total), Serum 8.8  Anion Gap 8  Osmolality (calc) 276  eGFR (African American)  10  eGFR (Non-African American)  8 (eGFR values <75m/min/1.73 m2 may be an indication of chronic kidney disease (CKD). Calculated eGFR, using the MRDR Study equation, is useful in  patients with stable renal function. The eGFR calculation will not be reliable in acutely ill patients when serum creatinine is changing rapidly. It is not  useful in patients on dialysis. The eGFR calculation may not be applicable to patients at the low and high extremes of body sizes, pregnant women, and vegetarians.)  Cardiac:  14-Dec-15 11:36   Troponin I < 0.02 (0.00-0.05 0.05 ng/mL or less: NEGATIVE  Repeat testing in 3-6 hrs  if clinically indicated. >0.05 ng/mL: POTENTIAL  MYOCARDIAL INJURY. Repeat  testing in 3-6 hrs if  clinically indicated. NOTE: An increase or decrease  of 30% or more on serial  testing suggests a  clinically important change)    17:28   Troponin I < 0.02 (0.00-0.05 0.05 ng/mL or less: NEGATIVE  Repeat testing in 3-6 hrs  if clinically indicated. >0.05 ng/mL: POTENTIAL  MYOCARDIAL INJURY. Repeat  testing in 3-6 hrs if  clinically indicated. NOTE: An increase or decrease  of 30% or more on serial  testing suggests a  clinically important change)  21:04   Troponin I < 0.02 (0.00-0.05 0.05 ng/mL or less: NEGATIVE  Repeat testing in 3-6 hrs  if clinically indicated. >0.05 ng/mL: POTENTIAL  MYOCARDIAL INJURY. Repeat  testing in 3-6 hrs if  clinically indicated. NOTE: An increase or decrease  of 30% or more on serial  testing suggests a  clinically important change)  Routine Hem:  15-Dec-15 04:09   WBC (CBC) 4.5  RBC (CBC)  3.33  Hemoglobin (CBC)  10.3  Hematocrit (CBC)  31.3  Platelet Count (CBC) 237  MCV 94  MCH 30.9  MCHC 32.9  RDW  15.0  Neutrophil % 57.5  Lymphocyte % 25.2  Monocyte % 13.7  Eosinophil % 3.2  Basophil % 0.4  Neutrophil # 2.6  Lymphocyte # 1.1  Monocyte # 0.6  Eosinophil # 0.1  Basophil # 0.0 (Result(s) reported on 05 May 2014 at 05:03AM.)   EKG:  EKG Interp. by me   Interpretation EKG shows sinus bradycardia, 39 bpm, RBBB, left anterior fascicular block, bifascicular block, LVH, no acute st/t changes   Radiology Results: XRay:    14-Dec-15 12:27, Chest Portable Single View  Chest Portable Single View   REASON FOR EXAM:    Chest pain  COMMENTS:        PROCEDURE: DXR - DXR PORTABLE CHEST SINGLE VIEW  - May 04 2014 12:27PM     CLINICAL DATA:  Chest pain.  Dialysis patient    EXAM:  PORTABLE CHEST - 1 VIEW    COMPARISON:  03/19/2013    FINDINGS:  Right jugular dialysis catheter tip in the lower SVC/RA junction.  Pulmonary vascularity is mildly congested. This is unchanged and  appears chronic and similar to multiple prior studies.    Mild bibasilar atelectasis unchanged. Negative for edema or  effusion.     IMPRESSION:  Mild vascular congestion is unchanged.    Mild bibasilar atelectasis also unchanged.      Electronically Signed    By: Franchot Gallo M.D.    On: 05/04/2014 12:59     Verified By: Truett Perna, M.D.,    Tylenol with Codeine #3: Anaphylaxis  Vital Signs/Nurse's Notes:  **Vital Signs.:   15-Dec-15 08:00  Vital Signs Type Routine    08:00  Temperature Temperature (F) 97.4  Celsius 36.3  Temperature Source oral    08:00  Pulse Pulse 61  Respirations Respirations 21  Systolic BP Systolic BP 174  Diastolic BP (mmHg) Diastolic BP (mmHg) 59  Mean BP 76  Pulse Ox % Pulse Ox % 95  Pulse Ox Activity Level  At rest  Oxygen Delivery 4L; Nasal Cannula    Impression 65 year old male with history of ESRD on HD (MWF), PAF/flutter, ventricular standstill with complete heart block in the setting of hyperkalemia and metabolic acidosis, OSA on O2 at night, and COPD who was admitted at [facility] 12/2013 with ventricular standstill with complete heart block requiring temporary venous pacer wire in the setting of hyperkalemia and metabolic acidosis presented to [facility] on 05/04/2014 with dizziness and chest pain while at the dialysis center. He was found to be bradycardic and set to [facility] for further evaluation.   1. Sinus bradycardia: Secondary to hyperkalemia -Resolved s/p emergent HD, currently in NSR with HR in 60s -In the setting of hyperkalemia -Not on any rate limiting medications -Avoid rate limiting medications  in the future if possible Would recommend change to potassium bath at dialysis (?), run potassium a little lower  2. ESRD on HD (MWF): -  Consider decreasing K+ in HD bath to allow for more K+ fluctuation -Monitor K+ closely  3. Question history of a-fib/flutter: -Currently in NSR -Plan to schedule outpatient monitor to evaluate this -If he indeed does have this he will require long term anticoagulation -He was not discharged on anticoagulation from his hospitalization in August  4:OSA on oxygen at night  5: COPD: Stable respiratory status   Electronic Signatures: Rise Mu (PA-C)  (Signed 15-Dec-15 08:46)  Authored: General Aspect/Present Illness, History and Physical Exam, Review of System, Family & Social History, Past Medical History, Home Medications, Labs, EKG , Radiology, Allergies, Vital Signs/Nurse's Notes, Impression/Plan Ida Rogue (MD)  (Signed 15-Dec-15 09:09)  Authored: General Aspect/Present Illness, History and Physical Exam, Review of System, Family & Social History, Health Issues, Labs, EKG , Vital Signs/Nurse's Notes, Impression/Plan  Co-Signer: General Aspect/Present Illness, History and Physical Exam, Review of System, Family & Social History, Past Medical History, Home Medications, Labs, EKG , Radiology, Allergies, Vital Signs/Nurse's Notes, Impression/Plan   Last Updated: 15-Dec-15 09:09 by Ida Rogue (MD)

## 2014-09-16 NOTE — Discharge Summary (Signed)
PATIENT NAME:  [name], Alazar B MR#:  [id number] DATE OF BIRTH:  1950/09/17  DATE OF ADMISSION:  05/04/2014 DATE OF DISCHARGE:  05/06/2014   ADDENDUM:    ADMITTING PHYSICIAN: Herschell Dimesichard J. Renae GlossWieting, MD  DISCHARGING PHYSICIAN: Enid Baasadhika Allyssa Abruzzese, MD  PRIMARY CARE PHYSICIAN: Serita Shellerrnest B. Maryellen PileEason, MD  [facility].  CONSULTATIONS IN THE HOSPITAL:  1.  Nephrology consultation by Mosetta PigeonHarmeet Singh, MD. 2.  Cardiology consultation by Antonieta Ibaimothy J. Gollan, MD  HOSPITAL COURSE: For more details, on the discharge summary, please look at the discharge summary dictated by Dr. Elby Showersatherine Walsh on 05/05/2014. In brief, Mr. Andrew [name] is a 64 year old African American male with known history of end-stage renal disease on hemodialysis, hypertension, atrial fibrillation paroxysmal, and congestive heart failure, who presented to the hospital secondary to hyperkalemia. He was also noted to be bradycardic.  1.  Bradycardia was asymptomatic. Bradycardia likely related to hyperkalemia and has had this in the past before. Potassium was adjusted. Heart rate remained in the 50s. The patient has been completely asymptomatic. He was supposed to be discharged on the 05/05/2014 back to [facility]; however, there was concern if his rhythm was AFib versus brady with possible heart block. However, after evaluating the rhythm, Dr. Mariah MillingGollan felt that it was sinus bradycardia and, since the patient is completely asymptomatic, he can be followed up as an outpatient. His potassium needs to be well control by him being compliant with his diet and also his dialysis. Also, per cardiology, the patient has sleep apnea and he does not use a CPAP machine because of a non-fitting mask,  and that needs to be addressed as it might help his bradycardia, too. He is being discharged in a stable condition.  2.  End-stage renal disease on hemodialysis. He is on Monday/Wednesday/Friday hemodialysis. He has been dialyzed  here in the hospital per his regular schedule.  3.  Possible atrial fibrillation history, in normal sinus rhythm. Not any anticoagulation. Will need further will followup with cardiology and see if he needs anticoagulation as an outpatient.  4.  Chronic obstructive pulmonary disease and sleep apnea. Oxygen at night time. Might need CPAP.  5.  Lower extremity weakness, chronic, unchanged, and the patient is wheelchair bound.   DISCHARGE MEDICATIONS: The same as on the discharge from 05/05/2014. Nothing has been changed.   DISCHARGE DIAGNOSES:  The same as on the discharge from 05/05/2014. Nothing has been changed.   His course has been otherwise uneventful in the hospital.   DISCHARGE CONDITION: Stable.   [facility]  TIME SPENT ON DISCHARGE: 40 minutes.    ____________________________ Enid Baasadhika Zitlaly Malson, MD rk:MT D: 05/06/2014 14:10:09 ET T: 05/06/2014 14:26:48 ET JOB#: [id number]  cc: Enid Baasadhika Turhan Chill, MD, <Dictator> Serita ShellerErnest B. Maryellen PileEason, MD Antonieta Ibaimothy J. Gollan, MD  Enid BaasADHIKA Ximenna Fonseca MD ELECTRONICALLY SIGNED 05/26/2014 13:22

## 2014-09-20 NOTE — Op Note (Signed)
PATIENT NAME:  Andrew [name], Andrew [name] MR#:  [id number] DATE OF BIRTH:  Jun 07, 1950  DATE OF PROCEDURE:  08/20/2014  PREOPERATIVE DIAGNOSES:  1. End-stage renal disease.  2. Congestive heart failure.  3. Morbid obesity.  4. Multiple failed previous dialysis access.   PREOPERATIVE DIAGNOSES:  1. End-stage renal disease.  2. Congestive heart failure.  3. Morbid obesity.  4. Multiple failed previous dialysis access.  PROCEDURE:  Right loop forearm brachial artery brachial vein AV graft.   SURGEON: Festus BarrenJason Dew, MD.  ASSISTANT: Raul DelKim Stegmayer, PA-C   ANESTHESIA: General.   BLOOD LOSS: 50 mL.   INDICATION FOR PROCEDURE: This is a gentleman, who is well-known to us for his dialysis access needs. His previous fistula has failed, despite attempts at salvage. An additional fistula was created with a small vein that is not mature for use. He now needs permanent dialysis access. Risks and benefits were discussed. Informed consent was obtained.   DESCRIPTION OF PROCEDURE: The patient is brought to the operative suite and right upper extremity was sterilely prepped and draped and a sterile surgical field was created. An incision was created over the antecubital fossa, dissecting down to the brachial artery and the brachial vein. Both were adequate for graft creation. We used the most curved tunneler with a small counter incision in the forearm and selected a 6 mm diameter propatent standard wall PTFE graft. This was tunneled, keeping the orientation with the dots. The patient was then given 4000 units of intravenous heparin. Control was obtained on the artery with vessel loops and an anterior wall arteriotomy was created with an 11 blade and extended with Potts scissors. A CV-6 suture was used to create anastomosis in the typical fashion. A single CV-6 patch suture was used for hemostasis. We flushed through the graft with good pulsatile inflow. The graft was then clamped and locally heparinized. The vein was  controlled with bulldog clamps. An anterior wall venotomy was created with an 11 blade and extended with Potts scissors. The graft was then cut and beveled to an appropriate length to match the venotomy and anastomosis created with the running CV-6 suture. Again, we flushed and de-aired prior to release and on release, 2 CV-6 patch sutures were used for hemostasis. There was an excellent thrill within the graft. There was some tunnel ooze that was controlled with pressure and some needle hole bleeding was also controlled with pressure, as well as the addition of Surgicel and Evicel topical hemostatic agents. The wound was then closed with 3-0 Vicryl and 4-0 Monocryl. Dermabond was placed as a dressing. The patient was awakened from anesthesia and taken to the recovery room in stable condition, having tolerated the procedure well.   ____________________________ Annice NeedyJason S. Dew, MD jsd:AT D: 08/20/2014 14:44:06 ET T: 08/20/2014 15:52:32 ET JOB#: [id number]  cc: Annice NeedyJason S. Dew, MD, <Dictator> Laverda SorensonSarath C. Kolluru, MD Annice NeedyJASON S DEW MD ELECTRONICALLY SIGNED 08/24/2014 15:52

## 2014-09-20 NOTE — Op Note (Signed)
PATIENT NAME:  Andrew [name], Andrew [name] MR#:  [id number] DATE OF BIRTH:  1951/03/20  DATE OF PROCEDURE:  06/04/2014  PREOPERATIVE DIAGNOSES: 1.  End-stage renal disease.  2.  Morbid obesity.  3.  Hypertension.  4.  Multiple failed previous dialysis access.   POSTOPERATIVE DIAGNOSES: 1.  End-stage renal disease.  2.  Morbid obesity.  3.  Hypertension.  4.  Multiple failed previous dialysis access.   PROCEDURE:  Right radiocephalic arteriovenous fistula creation.   SURGEON:  Annice NeedyJason S. Raygen Linquist, MD   ANESTHESIA:  General.   ESTIMATED BLOOD LOSS:  25 mL.   INDICATION FOR PROCEDURE:  This is a 64 year old gentleman with end-stage renal disease and multiple previous failed dialysis access. We are attempting a new dialysis access on him. He had vein mapping showing marginal cephalic vein in the forearm on the right and no other usable superficial vein. He has exhausted many of his options, particularly given the size of his upper arm, trying to stay in the forearm sounds reasonable, and we decided to give him an attempt at this marginal vein on the right. Risks and benefits were discussed. Informed consent was obtained.   DESCRIPTION OF PROCEDURE:  The patient is brought to the operative suite after an adequate level of general anesthesia was obtained. The right upper extremity was sterilely prepped and draped, and a sterile surgical field was created. We dissected out in the forearm in the typical fashion after creating an incision between the palpable radial artery and the cephalic vein. The cephalic vein was small. I was able to pass up to a 2.5 mm dilator. There was a bifurcation confluence of the vein several centimeters more proximal in the arm, and I felt that it was worth trying to place this fistula. He had a nice radial artery, and although we were not able to pass up to a 3 dilator, he had no other superficial vein options that were  identifiable, and I elected to try to place this fistula. The  patient was given 3000 units of intravenous heparin. Control was pulled up on the artery. An anterior wall arteriotomy was created with an 11 blade and extended with Potts scissors. The vein was then ligated distally, marked for orientation, cut and beveled to an appropriate length to match the arteriotomy, and anastomosis was created with a running 6-0 Prolene suture. A couple of 6-0 Prolene sutures were used as patch sutures for hemostasis, which was achieved. The vein and artery had some spasm and were treated with topical papaverine. There was not really a good thrill within the vein, but a soft pulse was palpable. I tried to free the adventitia of the vein extensively with tenotomy scissors. To improve flow, and there was some blood flow through the fistula and I felt we would just terminate the procedure and see if this heals and matures. If does not, we would consider subsequent upper arm AV graft. The wound was closed with 3-0 Vicryl and 4-0 Monocryl. Dermabond was placed as a dressing. The patient was awakened from anesthesia and taken to the recovery room in stable condition having tolerated the procedure well.    ____________________________ Annice NeedyJason S. Marjory Meints, MD jsd:nb D: 06/04/2014 16:56:01 ET T: 06/04/2014 23:15:38 ET JOB#: [id number]  cc: Annice NeedyJason S. Maridee Slape, MD, <Dictator> Annice NeedyJASON S Kervens Roper MD ELECTRONICALLY SIGNED 06/11/2014 10:38

## 2014-09-24 ENCOUNTER — Ambulatory Visit
Admission: RE | Admit: 2014-09-24 | Discharge: 2014-09-24 | Disposition: A | Discharge status: Skilled Nursing Facility | Payer: Medicare Other | Source: Ambulatory Visit | Attending: Vascular Surgery | Admitting: Vascular Surgery

## 2014-09-24 ENCOUNTER — Encounter
Admission: RE | Disposition: A | Discharge status: Skilled Nursing Facility | Payer: Medicare Other | Source: Ambulatory Visit | Attending: Vascular Surgery

## 2014-09-24 ENCOUNTER — Encounter: Payer: Self-pay | Admitting: *Deleted

## 2014-09-24 DIAGNOSIS — I120 Hypertensive chronic kidney disease with stage 5 chronic kidney disease or end stage renal disease: Secondary | ICD-10-CM | POA: Insufficient documentation

## 2014-09-24 DIAGNOSIS — Z4901 Encounter for fitting and adjustment of extracorporeal dialysis catheter: Secondary | ICD-10-CM | POA: Insufficient documentation

## 2014-09-24 DIAGNOSIS — N186 End stage renal disease: Secondary | ICD-10-CM | POA: Diagnosis not present

## 2014-09-24 DIAGNOSIS — Z885 Allergy status to narcotic agent status: Secondary | ICD-10-CM | POA: Insufficient documentation

## 2014-09-24 DIAGNOSIS — Z992 Dependence on renal dialysis: Secondary | ICD-10-CM | POA: Diagnosis not present

## 2014-09-24 DIAGNOSIS — E669 Obesity, unspecified: Secondary | ICD-10-CM | POA: Diagnosis not present

## 2014-09-24 HISTORY — DX: Sleep apnea, unspecified: G47.30

## 2014-09-24 HISTORY — PX: PERIPHERAL VASCULAR CATHETERIZATION: SHX172C

## 2014-09-24 HISTORY — DX: Heart failure, unspecified: I50.9

## 2014-09-24 SURGERY — DIALYSIS/PERMA CATHETER REMOVAL

## 2014-09-24 MED ORDER — LIDOCAINE-EPINEPHRINE (PF) 1 %-1:200000 IJ SOLN
INTRAMUSCULAR | Status: AC
  Filled 2014-09-24: qty 30

## 2014-09-24 MED ORDER — BACITRACIN-NEOMYCIN-POLYMYXIN 400-5-5000 EX OINT
TOPICAL_OINTMENT | CUTANEOUS | Status: AC
  Filled 2014-09-24: qty 1

## 2014-09-24 NOTE — H&P (Signed)
[facility] History & Physical Update  The patient was interviewed and re-examined.  The patient's previous History and Physical has been reviewed and is unchanged.  There is no change in the plan of care.  DEW,JASON, MD  09/24/2014, 8:32 AM

## 2014-09-24 NOTE — Op Note (Signed)
      Preoperative diagnosis:   1. ESRD with functional permanent access  Postoperative diagnosis:  1. ESRD with functional permanent access  Procedure:  Removal of right internal jugular Permcath  Surgeon:  Festus BarrenJason Dew, MD  Assistant: Raul DelKim Stegmayer, PA-C  Anesthesia:  Local  EBL:  Minimal  Indication for the Procedure:  The patient has a functional permanent dialysis access and no longer needs their permcath.  This can be removed.  Risks and benefits are discussed and informed consent is obtained.  Description of the Procedure:  The patient's right neck, chest and existing catheter were sterilely prepped and draped. The area around the catheter was anesthetized copiously with 1% lidocaine. The catheter was dissected out with curved hemostats until the cuff was freed from the surrounding fibrous sheath. The fiber sheath was transected, and the catheter was then removed in its entirety using gentle traction. Pressure was held and sterile dressings were placed. The patient tolerated the procedure well and was taken to the recovery room in stable condition.     DEW,JASON  09/24/2014, 8:39 AM

## 2014-10-07 ENCOUNTER — Encounter: Payer: Self-pay | Admitting: Vascular Surgery

## 2014-12-13 ENCOUNTER — Emergency Department: Payer: Medicare Other

## 2014-12-13 ENCOUNTER — Encounter: Payer: Self-pay | Admitting: *Deleted

## 2014-12-13 ENCOUNTER — Inpatient Hospital Stay
Admission: EM | Admit: 2014-12-13 | Discharge: 2014-12-15 | DRG: 189 | Disposition: A | Discharge status: Skilled Nursing Facility | Payer: Medicare Other | Attending: Specialist | Admitting: Specialist

## 2014-12-13 ENCOUNTER — Other Ambulatory Visit: Payer: Self-pay

## 2014-12-13 DIAGNOSIS — K219 Gastro-esophageal reflux disease without esophagitis: Secondary | ICD-10-CM | POA: Diagnosis present

## 2014-12-13 DIAGNOSIS — Z9981 Dependence on supplemental oxygen: Secondary | ICD-10-CM | POA: Diagnosis not present

## 2014-12-13 DIAGNOSIS — I959 Hypotension, unspecified: Secondary | ICD-10-CM | POA: Diagnosis present

## 2014-12-13 DIAGNOSIS — Z9889 Other specified postprocedural states: Secondary | ICD-10-CM

## 2014-12-13 DIAGNOSIS — L89151 Pressure ulcer of sacral region, stage 1: Secondary | ICD-10-CM | POA: Diagnosis present

## 2014-12-13 DIAGNOSIS — E785 Hyperlipidemia, unspecified: Secondary | ICD-10-CM | POA: Diagnosis present

## 2014-12-13 DIAGNOSIS — I120 Hypertensive chronic kidney disease with stage 5 chronic kidney disease or end stage renal disease: Secondary | ICD-10-CM | POA: Diagnosis present

## 2014-12-13 DIAGNOSIS — Z87891 Personal history of nicotine dependence: Secondary | ICD-10-CM | POA: Diagnosis not present

## 2014-12-13 DIAGNOSIS — I5033 Acute on chronic diastolic (congestive) heart failure: Secondary | ICD-10-CM | POA: Diagnosis present

## 2014-12-13 DIAGNOSIS — N400 Enlarged prostate without lower urinary tract symptoms: Secondary | ICD-10-CM | POA: Diagnosis present

## 2014-12-13 DIAGNOSIS — E039 Hypothyroidism, unspecified: Secondary | ICD-10-CM | POA: Diagnosis present

## 2014-12-13 DIAGNOSIS — D631 Anemia in chronic kidney disease: Secondary | ICD-10-CM | POA: Diagnosis present

## 2014-12-13 DIAGNOSIS — J189 Pneumonia, unspecified organism: Secondary | ICD-10-CM | POA: Diagnosis not present

## 2014-12-13 DIAGNOSIS — G4733 Obstructive sleep apnea (adult) (pediatric): Secondary | ICD-10-CM | POA: Diagnosis present

## 2014-12-13 DIAGNOSIS — E875 Hyperkalemia: Secondary | ICD-10-CM | POA: Diagnosis present

## 2014-12-13 DIAGNOSIS — R0902 Hypoxemia: Secondary | ICD-10-CM

## 2014-12-13 DIAGNOSIS — J9621 Acute and chronic respiratory failure with hypoxia: Secondary | ICD-10-CM | POA: Diagnosis not present

## 2014-12-13 DIAGNOSIS — N2581 Secondary hyperparathyroidism of renal origin: Secondary | ICD-10-CM | POA: Diagnosis present

## 2014-12-13 DIAGNOSIS — G809 Cerebral palsy, unspecified: Secondary | ICD-10-CM | POA: Diagnosis present

## 2014-12-13 DIAGNOSIS — N186 End stage renal disease: Secondary | ICD-10-CM | POA: Diagnosis present

## 2014-12-13 DIAGNOSIS — D869 Sarcoidosis, unspecified: Secondary | ICD-10-CM | POA: Diagnosis present

## 2014-12-13 DIAGNOSIS — Z992 Dependence on renal dialysis: Secondary | ICD-10-CM

## 2014-12-13 DIAGNOSIS — Z886 Allergy status to analgesic agent status: Secondary | ICD-10-CM | POA: Diagnosis not present

## 2014-12-13 DIAGNOSIS — J9620 Acute and chronic respiratory failure, unspecified whether with hypoxia or hypercapnia: Secondary | ICD-10-CM | POA: Diagnosis present

## 2014-12-13 HISTORY — DX: Disorder of kidney and ureter, unspecified: N28.9

## 2014-12-13 HISTORY — DX: Essential (primary) hypertension: I10

## 2014-12-13 LAB — CBC WITH DIFFERENTIAL/PLATELET
BASOS ABS: 0 10*3/uL (ref 0–0.1)
BASOS PCT: 0 %
EOS ABS: 0.2 10*3/uL (ref 0–0.7)
Eosinophils Relative: 2 %
HCT: 28 % — ABNORMAL LOW (ref 40.0–52.0)
Hemoglobin: 9 g/dL — ABNORMAL LOW (ref 13.0–18.0)
Lymphocytes Relative: 12 %
Lymphs Abs: 0.9 10*3/uL — ABNORMAL LOW (ref 1.0–3.6)
MCH: 27.7 pg (ref 26.0–34.0)
MCHC: 32.2 g/dL (ref 32.0–36.0)
MCV: 86.1 fL (ref 80.0–100.0)
Monocytes Absolute: 0.9 10*3/uL (ref 0.2–1.0)
Monocytes Relative: 13 %
NEUTROS PCT: 73 %
Neutro Abs: 5 10*3/uL (ref 1.4–6.5)
Platelets: 282 10*3/uL (ref 150–440)
RBC: 3.25 MIL/uL — ABNORMAL LOW (ref 4.40–5.90)
RDW: 15.8 % — AB (ref 11.5–14.5)
WBC: 7 10*3/uL (ref 3.8–10.6)

## 2014-12-13 LAB — COMPREHENSIVE METABOLIC PANEL
ALT: 7 U/L — AB (ref 17–63)
AST: 10 U/L — AB (ref 15–41)
Albumin: 2.8 g/dL — ABNORMAL LOW (ref 3.5–5.0)
Alkaline Phosphatase: 179 U/L — ABNORMAL HIGH (ref 38–126)
Anion gap: 17 — ABNORMAL HIGH (ref 5–15)
BUN: 64 mg/dL — AB (ref 6–20)
CO2: 26 mmol/L (ref 22–32)
Calcium: 8.1 mg/dL — ABNORMAL LOW (ref 8.9–10.3)
Chloride: 95 mmol/L — ABNORMAL LOW (ref 101–111)
Creatinine, Ser: 9.41 mg/dL — ABNORMAL HIGH (ref 0.61–1.24)
GFR calc Af Amer: 6 mL/min — ABNORMAL LOW (ref >60)
GFR calc non Af Amer: 5 mL/min — ABNORMAL LOW (ref >60)
GLUCOSE: 100 mg/dL — AB (ref 65–99)
Potassium: 4.2 mmol/L (ref 3.5–5.1)
Sodium: 138 mmol/L (ref 135–145)
TOTAL PROTEIN: 8.5 g/dL — AB (ref 6.5–8.1)
Total Bilirubin: 0.5 mg/dL (ref 0.3–1.2)

## 2014-12-13 LAB — TROPONIN I: Troponin I: 0.05 ng/mL — ABNORMAL HIGH (ref <0.031)

## 2014-12-13 LAB — BRAIN NATRIURETIC PEPTIDE: B Natriuretic Peptide: 686 pg/mL — ABNORMAL HIGH (ref 0.0–100.0)

## 2014-12-13 LAB — LACTIC ACID, PLASMA: Lactic Acid, Venous: 1 mmol/L (ref 0.5–2.0)

## 2014-12-13 MED ORDER — OXYBUTYNIN CHLORIDE ER 5 MG PO TB24
5.0000 mg | ORAL_TABLET | Freq: Every day | ORAL | Status: DC
  Administered 2014-12-14 – 2014-12-15 (×2): 5 mg via ORAL
  Filled 2014-12-13 (×2): qty 1

## 2014-12-13 MED ORDER — LORATADINE 10 MG PO TABS
10.0000 mg | ORAL_TABLET | Freq: Every day | ORAL | Status: DC
  Administered 2014-12-14 – 2014-12-15 (×2): 10 mg via ORAL
  Filled 2014-12-13 (×2): qty 1

## 2014-12-13 MED ORDER — CINACALCET HCL 30 MG PO TABS
60.0000 mg | ORAL_TABLET | Freq: Every day | ORAL | Status: DC
  Administered 2014-12-14 – 2014-12-15 (×2): 60 mg via ORAL
  Filled 2014-12-13 (×2): qty 2

## 2014-12-13 MED ORDER — NAPHAZOLINE HCL 0.1 % OP SOLN
1.0000 [drp] | Freq: Four times a day (QID) | OPHTHALMIC | Status: DC | PRN
  Administered 2014-12-13 – 2014-12-14 (×3): 1 [drp] via OPHTHALMIC
  Filled 2014-12-13: qty 15

## 2014-12-13 MED ORDER — ALUM & MAG HYDROXIDE-SIMETH 200-200-20 MG/5ML PO SUSP: 30.0000 mL | Freq: Four times a day (QID) | ORAL | Status: DC | PRN

## 2014-12-13 MED ORDER — ALBUTEROL SULFATE (2.5 MG/3ML) 0.083% IN NEBU
2.5000 mg | INHALATION_SOLUTION | RESPIRATORY_TRACT | Status: DC | PRN
  Administered 2014-12-14: 2.5 mg via RESPIRATORY_TRACT
  Filled 2014-12-13: qty 3

## 2014-12-13 MED ORDER — DEXTROSE 5 % IV SOLN
1.0000 g | Freq: Once | INTRAVENOUS | Status: AC
  Administered 2014-12-13: 1 g via INTRAVENOUS
  Filled 2014-12-13: qty 10

## 2014-12-13 MED ORDER — DEXTROSE 5 % IV SOLN
1.0000 g | INTRAVENOUS | Status: DC
  Filled 2014-12-13 (×2): qty 10

## 2014-12-13 MED ORDER — OMEPRAZOLE MAGNESIUM 20 MG PO TBEC: 20.0000 mg | DELAYED_RELEASE_TABLET | Freq: Every day | ORAL | Status: DC

## 2014-12-13 MED ORDER — HEPARIN SODIUM (PORCINE) 5000 UNIT/ML IJ SOLN
5000.0000 [IU] | Freq: Three times a day (TID) | INTRAMUSCULAR | Status: DC
  Administered 2014-12-14 – 2014-12-15 (×4): 5000 [IU] via SUBCUTANEOUS
  Filled 2014-12-13 (×4): qty 1

## 2014-12-13 MED ORDER — GEMFIBROZIL 600 MG PO TABS
600.0000 mg | ORAL_TABLET | Freq: Two times a day (BID) | ORAL | Status: DC
  Administered 2014-12-14 – 2014-12-15 (×3): 600 mg via ORAL
  Filled 2014-12-13 (×5): qty 1

## 2014-12-13 MED ORDER — POLYETHYLENE GLYCOL 3350 17 G PO PACK
17.0000 g | PACK | Freq: Two times a day (BID) | ORAL | Status: DC
  Administered 2014-12-15: 17 g via ORAL
  Filled 2014-12-13 (×3): qty 1

## 2014-12-13 MED ORDER — DEXTROSE 5 % IV SOLN
500.0000 mg | Freq: Once | INTRAVENOUS | Status: AC
  Administered 2014-12-13: 500 mg via INTRAVENOUS
  Filled 2014-12-13: qty 500

## 2014-12-13 MED ORDER — SODIUM CHLORIDE 0.9 % IV SOLN: 250.0000 mL | INTRAVENOUS | Status: DC | PRN

## 2014-12-13 MED ORDER — SENNOSIDES-DOCUSATE SODIUM 8.6-50 MG PO TABS
1.0000 | ORAL_TABLET | Freq: Two times a day (BID) | ORAL | Status: DC
  Administered 2014-12-14 – 2014-12-15 (×4): 1 via ORAL
  Filled 2014-12-13 (×4): qty 1

## 2014-12-13 MED ORDER — ACETAMINOPHEN 325 MG PO TABS
650.0000 mg | ORAL_TABLET | Freq: Two times a day (BID) | ORAL | Status: DC
  Administered 2014-12-14 – 2014-12-15 (×4): 650 mg via ORAL
  Filled 2014-12-13 (×4): qty 2

## 2014-12-13 MED ORDER — SEVELAMER CARBONATE 800 MG PO TABS
1600.0000 mg | ORAL_TABLET | Freq: Three times a day (TID) | ORAL | Status: DC
  Administered 2014-12-14 – 2014-12-15 (×4): 1600 mg via ORAL
  Filled 2014-12-13 (×4): qty 2

## 2014-12-13 MED ORDER — SODIUM CHLORIDE 0.9 % IJ SOLN
3.0000 mL | Freq: Two times a day (BID) | INTRAMUSCULAR | Status: DC
  Administered 2014-12-14 – 2014-12-15 (×4): 3 mL via INTRAVENOUS

## 2014-12-13 MED ORDER — CEFTRIAXONE SODIUM IN DEXTROSE 20 MG/ML IV SOLN: 1.0000 g | INTRAVENOUS | Status: DC

## 2014-12-13 MED ORDER — ASPIRIN EC 81 MG PO TBEC
81.0000 mg | DELAYED_RELEASE_TABLET | Freq: Every day | ORAL | Status: DC
  Administered 2014-12-14 – 2014-12-15 (×2): 81 mg via ORAL
  Filled 2014-12-13 (×2): qty 1

## 2014-12-13 MED ORDER — IOHEXOL 350 MG/ML SOLN
100.0000 mL | Freq: Once | INTRAVENOUS | Status: AC | PRN
  Administered 2014-12-13: 100 mL via INTRAVENOUS

## 2014-12-13 MED ORDER — SODIUM CHLORIDE 0.9 % IJ SOLN: 3.0000 mL | INTRAMUSCULAR | Status: DC | PRN

## 2014-12-13 MED ORDER — HYDROCODONE-ACETAMINOPHEN 5-325 MG PO TABS
1.0000 | ORAL_TABLET | ORAL | Status: DC | PRN
  Administered 2014-12-15: 1 via ORAL
  Filled 2014-12-13: qty 1

## 2014-12-13 MED ORDER — PANTOPRAZOLE SODIUM 40 MG PO TBEC
40.0000 mg | DELAYED_RELEASE_TABLET | Freq: Every day | ORAL | Status: DC
  Administered 2014-12-14 – 2014-12-15 (×2): 40 mg via ORAL
  Filled 2014-12-13 (×2): qty 1

## 2014-12-13 MED ORDER — BARRIER CREAM NON-SPECIFIED
1.0000 "application " | TOPICAL_CREAM | Freq: Three times a day (TID) | TOPICAL | Status: DC | PRN
  Filled 2014-12-13: qty 1

## 2014-12-13 MED ORDER — DEXTROSE 5 % IV SOLN
250.0000 mg | INTRAVENOUS | Status: DC
  Filled 2014-12-13: qty 250

## 2014-12-13 NOTE — ED Provider Notes (Signed)
[facility] Emergency Department Provider Note   ____________________________________________  Time seen: On EMS arrival I have reviewed the triage vital signs and the triage nursing note.  HISTORY  Chief Complaint Shortness of Breath   Historian Patient  HPI Andrew [name] is a 64 y.o. male who has a history of CHF and end-stage renal 40s on dialysis and A. fib, who is complaining of shortness of breath for a few days. He has been having a cough with green sputum. Reports he wears 3 L of oxygen at home. On room air he was reportedly in the 80s. On 4 L nasal cannula he is 89%. He has been intubated in the past several months ago for hyperkalemia per the patient. He is supposed to wear a CPAP at night, but states that the mask usually falls off. He is not a fever that he notes. No chest pain, no leg pain or new leg swelling.   Past Medical History  Diagnosis Date  . End stage renal disease   . Obesity, morbid   . GERD (gastroesophageal reflux disease)   . Cerebral palsy   . Sarcoidosis   . Hyperlipidemia   . Secondary hyperparathyroidism (of renal origin)   . Anemia in chronic kidney disease(285.21)   . 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 CHF (congestive heart failure)   . Sleep apnea   . Hypertension   . Renal insufficiency     Patient Active Problem List   Diagnosis Date Noted  . Atrial fibrillation 01/14/2014  . Normocytic anemia 01/14/2014  . Hyperkalemia 01/07/2014  . Acute respiratory failure with hypoxemia 01/07/2014  . ESRD (end stage renal disease) 01/07/2014  . Bradycardia 01/07/2014  . Cardiogenic shock 01/07/2014    Past Surgical History  Procedure Laterality Date  . Temporary pacemaker insertion N/A 01/07/2014    Procedure: TEMPORARY PACEMAKER INSERTION;  Surgeon: Pamella Pert, MD;  [facility] CATH LAB;  Service: Cardiovascular;  Laterality: N/A;  . Peripheral vascular catheterization N/A 09/24/2014     Procedure: Dialysis/Perma Catheter Removal;  Surgeon: Renford Dills, MD;  Location: [facility] INVASIVE CV LAB;  Service: Cardiovascular;  Laterality: N/A;    Current Outpatient Rx  Name  Route  Sig  Dispense  Refill  . acetaminophen (TYLENOL) 325 MG tablet   Oral   Take 650 mg by mouth 2 (two) times daily. scheduled         . albuterol (PROVENTIL) (2.5 MG/3ML) 0.083% nebulizer solution   Nebulization   Take 3 mLs (2.5 mg total) by nebulization every 4 (four) hours as needed for wheezing or shortness of breath.   75 mL   12   . alum & mag hydroxide-simeth (MYLANTA) 200-200-20 MG/5ML suspension   Oral   Take 30 mLs by mouth every 6 (six) hours as needed for indigestion or heartburn.         Marland Kitchen aspirin 81 MG tablet   Oral   Take 81 mg by mouth daily.         Marland Kitchen azithromycin (ZITHROMAX) 250 MG tablet   Oral   Take 250 mg by mouth daily. X 4 days.         . cinacalcet (SENSIPAR) 30 MG tablet   Oral   Take 60 mg by mouth daily.         Marland Kitchen gemfibrozil (LOPID) 600 MG tablet   Oral   Take 600 mg by mouth 2 (two) times daily before a meal.         .  HYDROcodone-acetaminophen (NORCO/VICODIN) 5-325 MG per tablet   Oral   Take 1 tablet by mouth every 4 (four) hours as needed for moderate pain.   30 tablet   0   . lidocaine-prilocaine (EMLA) cream   Topical   Apply 1 application topically every Monday, Wednesday, and Friday. Apply to fistula site         . loratadine (CLARITIN) 10 MG tablet   Oral   Take 10 mg by mouth daily.         Marland Kitchen omeprazole (PRILOSEC OTC) 20 MG tablet   Oral   Take 20 mg by mouth daily.         Marland Kitchen oxybutynin (DITROPAN-XL) 5 MG 24 hr tablet   Oral   Take 5 mg by mouth daily.          . polyethylene glycol (MIRALAX / GLYCOLAX) packet   Oral   Take 17 g by mouth 2 (two) times daily. Patient taking differently: Take 17 g by mouth every 6 (six) hours as needed for mild constipation, moderate constipation or severe constipation.    14  each   0   . sennosides-docusate sodium (SENOKOT-S) 8.6-50 MG tablet   Oral   Take 1 tablet by mouth 2 (two) times daily.          . sevelamer carbonate (RENVELA) 800 MG tablet   Oral   Take 1,600 mg by mouth 3 (three) times daily with meals.           Allergies Codeine  History reviewed. No pertinent family history.  Social History History  Substance Use Topics  . Smoking status: Former Smoker -- 1.00 packs/day for 1 years  . Smokeless tobacco: Former Neurosurgeon  . Alcohol Use: No    Review of Systems  Constitutional: Negative for fever. Eyes: Negative for visual changes. ENT: Negative for sore throat. Cardiovascular: Negative for chest pain. Respiratory: Positive per history of present illness Gastrointestinal: Negative for abdominal pain, vomiting and diarrhea. Genitourinary: Negative for dysuria. Musculoskeletal: Negative for back pain. Skin: Negative for rash. Neurological: Negative for headaches, focal weakness or numbness. 10 point Review of Systems otherwise negative ____________________________________________   PHYSICAL EXAM:  VITAL SIGNS: ED Triage Vitals  Enc Vitals Group     BP 12/13/14 1107 115/70 mmHg     Pulse Rate 12/13/14 1107 106     Resp --      Temp 12/13/14 1107 98.2 F (36.8 C)     Temp Source 12/13/14 1107 Oral     SpO2 12/13/14 1107 90 %     Weight 12/13/14 1107 250 lb (113.399 kg)     Height 12/13/14 1107  (1.549 m)     Head Cir --      Peak Flow --      Pain Score --      Pain Loc --      Pain Edu? --      Excl. in GC? --      Constitutional: Alert and oriented. Mild respiratory distress, double speaking due to shortness of breath. Eyes: Conjunctivae are normal. PERRL. Normal extraocular movements. ENT   Head: Normocephalic and atraumatic.   Nose: No congestion/rhinnorhea.   Mouth/Throat: Mucous membranes are moist.   Neck: No stridor. Cardiovascular/Chest: Normal rate, regular rhythm.  No murmurs,  rubs, or gallops. Respiratory: Normal respiratory effort without tachypnea nor retractions. Breath sounds are clear and equal bilaterally. No wheezes/rales/rhonchi. Gastrointestinal: Soft. No distention, no guarding, no rebound. Nontender . Obese  Genitourinary/rectal:Deferred Musculoskeletal: Nontender with normal range of motion in all extremities. No joint effusions.  No lower extremity tenderness.  1+ lower extremity edema Neurologic:  Normal speech and language. No gross or focal neurologic deficits are appreciated. Skin:  Skin is warm, dry and intact. No rash noted. Psychiatric: Mood and affect are normal. Speech and behavior are normal. Patient exhibits appropriate insight and judgment.  ____________________________________________   EKG I, Governor Rooks, MD, the attending physician have personally viewed and interpreted all ECGs.  80 bpm. Sinus rhythm with a sinus arrhythmia. First degree AV block. Right bundle branch block. Left axis deviation. Nonspecific T wave. ____________________________________________  LABS (pertinent positives/negatives)  BUN 64 creatinine 9.41 potassium is 4.2 no other significant abnormalities on metabolic panel BNP 686 Troponin 0.05 Lactate 1.0 White blood cell count 7.0, hemoglobin 9.0  ____________________________________________  RADIOLOGY All Xrays were viewed by me. Imaging interpreted by Radiologist.  Chest x-ray portable:  IMPRESSION: Increased pulmonary vascular congestion with mild interstitial edema.  CT chest to rule out PE:  IMPRESSION: 1. Mildly motion degraded examination. No evidence of central pulmonary emboli. 2. Patchy consolidative and ground-glass opacities bilaterally, predominantly in the upper lobes and suspicious for multifocal pneumonia, possibly with superimposed edema. __________________________________________  PROCEDURES  Procedure(s) performed: None Critical Care performed:  None  ____________________________________________   ED COURSE / ASSESSMENT AND PLAN  CONSULTATIONS: Face-to-face with hospitalist for admission  Pertinent labs & imaging results that were available during my care of the patient were reviewed by me and considered in my medical decision making (see chart for details).  Patient is hypoxic relative to his baseline still satting 89% on 4 L and troponin talks. Symptoms sound like either pneumonia versus CHF exacerbation versus pulmonary embolus. Without fever, normal white blood cell count, and chest x-ray clear without infiltrate, pneumonia seems unlikely. There is edema some possibly CHF is the underlying problem, however I did obtain a chest CT  Chest CT showed no PE, however concern for multifocal pneumonia. Patient was started on antibiotics for community acquired pneumonia, Rocephin and Zithromax.  Patient / Family / Caregiver informed of clinical course, medical decision-making process, and agree with plan.   I discussed return precautions, follow-up instructions, and discharged instructions with patient and/or family.  ___________________________________________   FINAL CLINICAL IMPRESSION(S) / ED DIAGNOSES   Final diagnoses:  Community acquired pneumonia  Hypoxia       Governor Rooks, MD 12/13/14 1531

## 2014-12-13 NOTE — ED Notes (Signed)
Patient is resting comfortably.  Channel on TV changed to patient's liking. Denies any additional needs.

## 2014-12-13 NOTE — H&P (Signed)
Andrew [name] is an 64 y.o. male.   Chief Complaint: Cough and SOB HPI: Presents with 3 day history of cough and SOB. Last had HD on Friday as scheduled. Cough has been productive. No fever. Had been started on zithromax recently.  Past Medical History  Diagnosis Date  . End stage renal disease   . Obesity, morbid   . GERD (gastroesophageal reflux disease)   . Cerebral palsy   . Sarcoidosis   . Hyperlipidemia   . Secondary hyperparathyroidism (of renal origin)   . Anemia in chronic kidney disease(285.21)   . 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 CHF (congestive heart failure)   . Sleep apnea   . Hypertension   . Renal insufficiency     Past Surgical History  Procedure Laterality Date  . Temporary pacemaker insertion N/A 01/07/2014    Procedure: TEMPORARY PACEMAKER INSERTION;  Surgeon: Laverda Page, MD;  Location: [facility] CATH LAB;  Service: Cardiovascular;  Laterality: N/A;  . Peripheral vascular catheterization N/A 09/24/2014    Procedure: Dialysis/Perma Catheter Removal;  Surgeon: Katha Cabal, MD;  Location: [facility] CV LAB;  Service: Cardiovascular;  Laterality: N/A;    History reviewed. No pertinent family history.Hx of CAD Social History:  reports that he has quit smoking. He has quit using smokeless tobacco. He reports that he does not drink alcohol or use illicit drugs.  Allergies:  Allergies  Allergen Reactions  . Codeine Other (See Comments)    Per MAR     (Not in a hospital admission)  Results for orders placed or performed during the hospital encounter of 12/13/14 (from the past 48 hour(s))  Comprehensive metabolic panel     Status: Abnormal   Collection Time: 12/13/14 11:41 AM  Result Value Ref Range   Sodium 138 135 - 145 mmol/L   Potassium 4.2 3.5 - 5.1 mmol/L   Chloride 95 (L) 101 - 111 mmol/L   CO2 26 22 - 32 mmol/L   Glucose, Bld 100 (H) 65 - 99 mg/dL   BUN 64 (H) 6 - 20 mg/dL   Creatinine, Ser 9.41 (H) 0.61 - 1.24 mg/dL    Calcium 8.1 (L) 8.9 - 10.3 mg/dL   Total Protein 8.5 (H) 6.5 - 8.1 g/dL   Albumin 2.8 (L) 3.5 - 5.0 g/dL   AST 10 (L) 15 - 41 U/L   ALT 7 (L) 17 - 63 U/L   Alkaline Phosphatase 179 (H) 38 - 126 U/L   Total Bilirubin 0.5 0.3 - 1.2 mg/dL   GFR calc non Af Amer 5 (L) >60 mL/min   GFR calc Af Amer 6 (L) >60 mL/min    Comment: (NOTE) The eGFR has been calculated using the CKD EPI equation. This calculation has not been validated in all clinical situations. eGFR's persistently <60 mL/min signify possible Chronic Kidney Disease.    Anion gap 17 (H) 5 - 15  Brain natriuretic peptide     Status: Abnormal   Collection Time: 12/13/14 11:41 AM  Result Value Ref Range   B Natriuretic Peptide 686.0 (H) 0.0 - 100.0 pg/mL  Troponin I     Status: Abnormal   Collection Time: 12/13/14 11:41 AM  Result Value Ref Range   Troponin I 0.05 (H) <0.031 ng/mL    Comment: READ BACK AND VERIFIED WITH KIM CHERRY AT 1300 ON 12/13/14 BY [facility]        PERSISTENTLY INCREASED TROPONIN VALUES IN THE RANGE OF 0.04-0.49 ng/mL CAN BE SEEN IN:       -  UNSTABLE ANGINA       -CONGESTIVE HEART FAILURE       -MYOCARDITIS       -CHEST TRAUMA       -ARRYHTHMIAS       -LATE PRESENTING MYOCARDIAL INFARCTION       -COPD   CLINICAL FOLLOW-UP RECOMMENDED.   Lactic acid, plasma     Status: None   Collection Time: 12/13/14 11:41 AM  Result Value Ref Range   Lactic Acid, Venous 1.0 0.5 - 2.0 mmol/L  CBC with Differential     Status: Abnormal   Collection Time: 12/13/14 11:41 AM  Result Value Ref Range   WBC 7.0 3.8 - 10.6 K/uL   RBC 3.25 (L) 4.40 - 5.90 MIL/uL   Hemoglobin 9.0 (L) 13.0 - 18.0 g/dL   HCT 28.0 (L) 40.0 - 52.0 %   MCV 86.1 80.0 - 100.0 fL   MCH 27.7 26.0 - 34.0 pg   MCHC 32.2 32.0 - 36.0 g/dL   RDW 15.8 (H) 11.5 - 14.5 %   Platelets 282 150 - 440 K/uL   Neutrophils Relative % 73 %   Neutro Abs 5.0 1.4 - 6.5 K/uL   Lymphocytes Relative 12 %   Lymphs Abs 0.9 (L) 1.0 - 3.6 K/uL   Monocytes Relative 13 %    Monocytes Absolute 0.9 0.2 - 1.0 K/uL   Eosinophils Relative 2 %   Eosinophils Absolute 0.2 0 - 0.7 K/uL   Basophils Relative 0 %   Basophils Absolute 0.0 0 - 0.1 K/uL   Ct Angio Chest Pe W/cm &/or Wo Cm  12/13/2014   CLINICAL DATA:  Shortness of breath for several days. Hypoxia. History of end-stage renal disease on dialysis.  EXAM: CT ANGIOGRAPHY CHEST WITH CONTRAST  TECHNIQUE: Multidetector CT imaging of the chest was performed using the standard protocol during bolus administration of intravenous contrast. Multiplanar CT image reconstructions and MIPs were obtained to evaluate the vascular anatomy.  CONTRAST:  186m OMNIPAQUE IOHEXOL 350 MG/ML SOLN  COMPARISON:  Chest radiograph earlier today.  Chest CT 03/19/2013.  FINDINGS: Examination is partially limited by respiratory motion artifact and patient body habitus. No definite segmental or more proximal pulmonary emboli are identified.  Heart is mildly enlarged. Aortic and coronary artery calcification is noted. There is no pleural or pericardial effusion. Right azygoesophageal recess lymph node is mildly increased in size from the prior CT, measuring 1.5 cm in short axis (previously 10 mm). There is no pleural or pericardial effusion.  Evaluation of the lung parenchyma is mildly limited by motion artifact. There are patchy consolidative and ground-glass opacities bilaterally, predominantly in the upper lobes although with some involvement of the lower lobes as well and with some areas of air bronchograms. Mild scarring or atelectasis is noted in the right middle lobe and lingula.  The visualized portion of the upper abdomen is unremarkable. Diffusely increased osseous density may reflect underlying renal osteodystrophy.  Review of the MIP images confirms the above findings.  IMPRESSION: 1. Mildly motion degraded examination. No evidence of central pulmonary emboli. 2. Patchy consolidative and ground-glass opacities bilaterally, predominantly in the upper  lobes and suspicious for multifocal pneumonia, possibly with superimposed edema.   Electronically Signed   By: ALogan Bores  On: 12/13/2014 15:18   Dg Chest Port 1 View  12/13/2014   CLINICAL DATA:  Shortness of breath for several days. Cough. Hypoxia. History of end-stage renal disease on dialysis.  EXAM: PORTABLE CHEST - 1 VIEW  COMPARISON:  05/04/2014  FINDINGS: Right jugular catheter has been removed. Cardiac silhouette remains mildly enlarged. Pulmonary vascular congestion has mildly increased from the prior study and there are mildly increased interstitial densities throughout both lungs. No segmental airspace consolidation, sizeable pleural effusion, or pneumothorax is identified. Lungs are mildly hypoinflated. No acute osseous abnormality is seen.  IMPRESSION: Increased pulmonary vascular congestion with mild interstitial edema.   Electronically Signed   By: Logan Bores   On: 12/13/2014 11:48    Review of Systems  Constitutional: Negative for fever and chills.  HENT: Negative for hearing loss and sore throat.   Eyes: Positive for redness. Negative for blurred vision.  Respiratory: Positive for cough and sputum production.   Cardiovascular: Negative for chest pain and leg swelling.  Gastrointestinal: Negative for nausea and vomiting.  Genitourinary: Negative for dysuria.  Musculoskeletal: Positive for back pain.  Skin: Negative for rash.  Neurological: Positive for weakness.    Blood pressure 144/88, pulse 73, temperature 98.2 F (36.8 C), temperature source Oral, resp. rate 18, height 5' 1"  (1.549 m), weight 113.399 kg (250 lb), SpO2 95 %. Physical Exam  Constitutional: He is oriented to person, place, and time.  Obese male in NAD  HENT:  Head: Normocephalic and atraumatic.  Mouth/Throat: Oropharynx is clear and moist. No oropharyngeal exudate.  Eyes: Pupils are equal, round, and reactive to light. No scleral icterus.  Left eye red and mildly injected  Neck: Neck supple. No JVD  present. No thyromegaly present.  Cardiovascular: Normal rate.   Murmur heard. Respiratory: Effort normal. No respiratory distress. He has rales. He exhibits no tenderness.  GI: Soft. Bowel sounds are normal. He exhibits no distension and no mass. There is tenderness.  Musculoskeletal: He exhibits edema. He exhibits no tenderness.  Lymphadenopathy:    He has no cervical adenopathy.  Neurological: He is alert and oriented to person, place, and time. No cranial nerve deficit.  Skin: Skin is warm and dry. No rash noted.     Assessment/Plan 1. Acute on Chronic Respiratory Failure: Usually on 3 L oxygn at home. Had to turn it up this weekend to keep sats up. Will continue oxygen support and treat underlying causes below.  2. Pulmonary Edema: CT scan shows some edema. Will undergo dialysis in am to pull fluid off.  3. Pneumonia: CT shows what looks like scattered infiltrates. Will start abx.  4. ESRD: HD on M,W,F. HD in am.  5. Stage 1 Sacral Decubitis Ulcer: Barrier cream for now. May need wound care nurse to assess.  6. Left Eye Conjuctivitis: Likely allergic. Start lubercating eye drops.  Time spent 60 min  Baxter Hire 12/13/2014, 5:09 PM

## 2014-12-13 NOTE — ED Notes (Signed)
Dr. Lord notified of elevated troponin. 

## 2014-12-13 NOTE — ED Notes (Signed)
Pt a resident of [address], c/o SOB for several days, staff told pt he will see a doctor, pt never did, pt c/o cough, reporting green mucus, O2 sats in the mid 80's upon arrival, increased O2 to 4L [place], stats rose to low 90's..  Pt has Hx ESRD, M-W-F dialysis.

## 2014-12-14 LAB — BASIC METABOLIC PANEL
ANION GAP: 18 — AB (ref 5–15)
BUN: 77 mg/dL — ABNORMAL HIGH (ref 6–20)
CALCIUM: 8.2 mg/dL — AB (ref 8.9–10.3)
CO2: 27 mmol/L (ref 22–32)
CREATININE: 10.86 mg/dL — AB (ref 0.61–1.24)
Chloride: 94 mmol/L — ABNORMAL LOW (ref 101–111)
GFR calc non Af Amer: 4 mL/min — ABNORMAL LOW (ref >60)
GFR, EST AFRICAN AMERICAN: 5 mL/min — AB (ref >60)
GLUCOSE: 103 mg/dL — AB (ref 65–99)
POTASSIUM: 5.1 mmol/L (ref 3.5–5.1)
Sodium: 139 mmol/L (ref 135–145)

## 2014-12-14 LAB — PHOSPHORUS: Phosphorus: 7.8 mg/dL — ABNORMAL HIGH (ref 2.5–4.6)

## 2014-12-14 LAB — MRSA PCR SCREENING: MRSA BY PCR: NEGATIVE

## 2014-12-14 MED ORDER — MIDODRINE HCL 5 MG PO TABS
5.0000 mg | ORAL_TABLET | ORAL | Status: DC
  Administered 2014-12-14: 5 mg via ORAL
  Filled 2014-12-14: qty 1

## 2014-12-14 MED ORDER — EPOETIN ALFA 10000 UNIT/ML IJ SOLN
10000.0000 [IU] | Freq: Once | INTRAMUSCULAR | Status: AC
  Administered 2014-12-14: 10000 [IU] via INTRAVENOUS

## 2014-12-14 MED ORDER — ACETAMINOPHEN 325 MG PO TABS: 650.0000 mg | ORAL_TABLET | Freq: Four times a day (QID) | ORAL | Status: DC | PRN

## 2014-12-14 MED ORDER — DEXTROSE 5 % IV SOLN
250.0000 mg | INTRAVENOUS | Status: DC
  Administered 2014-12-14: 250 mg via INTRAVENOUS
  Filled 2014-12-14 (×2): qty 250

## 2014-12-14 NOTE — Progress Notes (Signed)
[facility] - [place] at [facility]   PATIENT NAME: Andrew [name]    MR#:  [id number]  DATE OF BIRTH:  [birth year]  SUBJECTIVE:  CHIEF COMPLAINT:   Chief Complaint  Patient presents with  . Shortness of Breath   Patient here due to shortness of breath, cough and also productive sputum which is yellow/green in color. Feels a little bit better since yesterday.  REVIEW OF SYSTEMS:    Review of Systems  Constitutional: Negative for fever and chills.  HENT: Negative for congestion and tinnitus.   Eyes: Negative for blurred vision and double vision.  Respiratory: Positive for cough, sputum production and shortness of breath. Negative for wheezing.   Cardiovascular: Negative for chest pain, orthopnea and PND.  Gastrointestinal: Negative for nausea, vomiting, abdominal pain and diarrhea.  Genitourinary: Negative for dysuria and hematuria.  Neurological: Negative for dizziness, sensory change and focal weakness.  All other systems reviewed and are negative.   Nutrition: Renal Tolerating Diet: Yes  DRUG ALLERGIES:   Allergies  Allergen Reactions  . Codeine Other (See Comments)    Per MAR    VITALS:  Blood pressure 127/70, pulse 82, temperature 97.9 F (36.6 C), temperature source Oral, resp. rate 20, height 5\' 1"  (1.549 m), weight 112.175 kg (247 lb 4.8 oz), SpO2 88 %.  PHYSICAL EXAMINATION:   Physical Exam  GENERAL:  64 y.o.-year-old morbidly obese patient lying in the bed with no acute distress.  EYES: Pupils equal, round, reactive to light and accommodation. No scleral icterus. Extraocular muscles intact.  HEENT: Head atraumatic, normocephalic. Oropharynx and nasopharynx clear.  NECK:  Supple, no jugular venous distention. No thyroid enlargement, no tenderness.  LUNGS: Normal breath sounds bilaterally, no wheezing, rales, rhonchi. No use of accessory muscles of respiration.  CARDIOVASCULAR: S1, S2 normal. No murmurs, rubs, or gallops.   ABDOMEN: Soft, nontender, nondistended. Bowel sounds present. No organomegaly or mass.  EXTREMITIES: No cyanosis, clubbing or edema b/l.    NEUROLOGIC: Cranial nerves II through XII are intact. No focal Motor or sensory deficits b/l. Globally weak  PSYCHIATRIC: The patient is alert and oriented x 3. Good affect SKIN: No obvious rash, lesion, or ulcer.   Right upper extremity AV fistula with good bruit and good thrill.  LABORATORY PANEL:   CBC  Recent Labs Lab 12/13/14 1141  WBC 7.0  HGB 9.0*  HCT 28.0*  PLT 282   ------------------------------------------------------------------------------------------------------------------  Chemistries   Recent Labs Lab 12/13/14 1141 12/14/14 0544  NA 138 139  K 4.2 5.1  CL 95* 94*  CO2 26 27  GLUCOSE 100* 103*  BUN 64* 77*  CREATININE 9.41* 10.86*  CALCIUM 8.1* 8.2*  AST 10*  --   ALT 7*  --   ALKPHOS 179*  --   BILITOT 0.5  --    ------------------------------------------------------------------------------------------------------------------  Cardiac Enzymes  Recent Labs Lab 12/13/14 1141  TROPONINI 0.05*   ------------------------------------------------------------------------------------------------------------------  RADIOLOGY:  Ct Angio Chest Pe W/cm &/or Wo Cm  12/13/2014   CLINICAL DATA:  Shortness of breath for several days. Hypoxia. History of end-stage renal disease on dialysis.  EXAM: CT ANGIOGRAPHY CHEST WITH CONTRAST  TECHNIQUE: Multidetector CT imaging of the chest was performed using the standard protocol during bolus administration of intravenous contrast. Multiplanar CT image reconstructions and MIPs were obtained to evaluate the vascular anatomy.  CONTRAST:  OMNIPAQUE IOHEXOL 350 MG/ML SOLN  COMPARISON:  Chest radiograph earlier today.  Chest CT 03/19/2013.  FINDINGS: Examination is partially limited by respiratory  motion artifact and patient body habitus. No definite segmental or more proximal  pulmonary emboli are identified.  Heart is mildly enlarged. Aortic and coronary artery calcification is noted. There is no pleural or pericardial effusion. Right azygoesophageal recess lymph node is mildly increased in size from the prior CT, measuring 1.5 cm in short axis (previously 10 mm). There is no pleural or pericardial effusion.  Evaluation of the lung parenchyma is mildly limited by motion artifact. There are patchy consolidative and ground-glass opacities bilaterally, predominantly in the upper lobes although with some involvement of the lower lobes as well and with some areas of air bronchograms. Mild scarring or atelectasis is noted in the right middle lobe and lingula.  The visualized portion of the upper abdomen is unremarkable. Diffusely increased osseous density may reflect underlying renal osteodystrophy.  Review of the MIP images confirms the above findings.  IMPRESSION: 1. Mildly motion degraded examination. No evidence of central pulmonary emboli. 2. Patchy consolidative and ground-glass opacities bilaterally, predominantly in the upper lobes and suspicious for multifocal pneumonia, possibly with superimposed edema.   Electronically Signed   By: Sebastian Ache   On: 12/13/2014 15:18   Dg Chest Port 1 View  12/13/2014   CLINICAL DATA:  Shortness of breath for several days. Cough. Hypoxia. History of end-stage renal disease on dialysis.  EXAM: PORTABLE CHEST - 1 VIEW  COMPARISON:  05/04/2014  FINDINGS: Right jugular catheter has been removed. Cardiac silhouette remains mildly enlarged. Pulmonary vascular congestion has mildly increased from the prior study and there are mildly increased interstitial densities throughout both lungs. No segmental airspace consolidation, sizeable pleural effusion, or pneumothorax is identified. Lungs are mildly hypoinflated. No acute osseous abnormality is seen.  IMPRESSION: Increased pulmonary vascular congestion with mild interstitial edema.   Electronically Signed    By: Sebastian Ache   On: 12/13/2014 11:48     ASSESSMENT AND PLAN:   64 year old male with past medical history of end-stage renal disease on hemodialysis, obstructive sleep apnea, GERD, secondary hyperparathyroidism, urinary incontinence, hyperlipidemia, history of sarcoidosis, history of CHF, who presented to the hospital due to shortness of breath cough.  #1 acute on chronic respiratory failure-this is likely secondary to pneumonia/mild CHF. -Continue IV ceftriaxone and Zithromax for the pneumonia and follow clinically. -Patient will also have hemodialysis today to get extra fluid removed and we'll follow clinically  #2 pneumonia-continue IV ceftriaxone, Zithromax. -Follow blood and sputum cultures.  #3 CHF-acute on chronic diastolic CHF. -We'll have hemodialysis today for extra fluid removal and we'll follow clinically.  #4 and stage renal disease on hemodialysis-patient is on Monday Wednesday Friday schedule. -Nephrology is consulted and patient to have hemodialysis today.  #5 secondary hyperparathyroidism-continue Sensipar, Renvela.  #6 GERD-continue Protonix.  #7 hyperlipidemia-continue gemfibrozil.    All the records are reviewed and case discussed with Care Management/Social Workerr. Management plans discussed with the patient, family and they are in agreement.  CODE STATUS: Full  DVT Prophylaxis: Heparin subcutaneous  TOTAL TIME TAKING CARE OF THIS PATIENT: 30 minutes.   POSSIBLE D/C IN 1-2 DAYS, DEPENDING ON CLINICAL CONDITION.   Houston Siren M.D on 12/14/2014 at 10:45 AM  Between 7am to 6pm - Pager - [phone]  After 6pm go to [email or web address] - password [email or web address] [facility]  [facility]  Office  [phone]  CC: Primary care physician; No PCP Per Patient

## 2014-12-14 NOTE — Clinical Social Work Note (Signed)
Patient is a long term resident of [facility]. CSW will complete full assessment. Fl2 on chart. Rosey Bath at [facility] has stated that they can take patient back at discharge when time.  York Spaniel MSW,LCSW [phone]

## 2014-12-14 NOTE — Progress Notes (Signed)
HD START 

## 2014-12-14 NOTE — Progress Notes (Signed)
PRE HD   

## 2014-12-14 NOTE — Progress Notes (Signed)
[facility]  ROUNDING NOTE   Subjective:   Admitted for productive cough, shortness of breath and reported hemoptysis. Failed outpatient azithromycin.  Last hemodialysis was Friday.   Objective:  Vital signs in last 24 hours:  Temp:  [97.9 F (36.6 C)-98.2 F (36.8 C)] 97.9 F (36.6 C) (07/25 0822) Pulse Rate:  [69-106] 82 (07/25 0822) Resp:  [14-25] 20 (07/25 0822) BP: (105-144)/(56-88) 127/70 mmHg (07/25 0822) SpO2:  [0 %-98 %] 88 % (07/25 0822) Weight:  [112.175 kg (247 lb 4.8 oz)-113.399 kg (250 lb)] 112.175 kg (247 lb 4.8 oz) (07/25 0003)  Weight change:  Filed Weights   12/13/14 1107 12/14/14 0003  Weight: 113.399 kg (250 lb) 112.175 kg (247 lb 4.8 oz)    Intake/Output:     Intake/Output this shift:     Physical Exam: General: NAD,   Head: Normocephalic, atraumatic. Moist oral mucosal membranes  Eyes: Anicteric, PERRL  Neck: Supple, trachea midline  Lungs:  Course breath sounds, crackles bilaterally  Heart: Regular rate and rhythm  Abdomen:  Soft, nontender,   Extremities: no peripheral edema.  Neurologic: Nonfocal, moving all four extremities  Skin: No lesions  Access: Right forearm AVG    Basic Metabolic Panel:  Recent Labs Lab 12/13/14 1141 12/14/14 0544  NA 138 139  K 4.2 5.1  CL 95* 94*  CO2 26 27  GLUCOSE 100* 103*  BUN 64* 77*  CREATININE 9.41* 10.86*  CALCIUM 8.1* 8.2*    Liver Function Tests:  Recent Labs Lab 12/13/14 1141  AST 10*  ALT 7*  ALKPHOS 179*  BILITOT 0.5  PROT 8.5*  ALBUMIN 2.8*   No results for input(s): LIPASE, AMYLASE in the last 168 hours. No results for input(s): AMMONIA in the last 168 hours.  CBC:  Recent Labs Lab 12/13/14 1141  WBC 7.0  NEUTROABS 5.0  HGB 9.0*  HCT 28.0*  MCV 86.1  PLT 282    Cardiac Enzymes:  Recent Labs Lab 12/13/14 1141  TROPONINI 0.05*    BNP: Invalid input(s): POCBNP  CBG: No results for input(s): GLUCAP in the last 168  hours.  Microbiology: Results for orders placed or performed during the hospital encounter of 12/13/14  Culture, blood (routine x 2)     Status: None (Preliminary result)   Collection Time: 12/13/14 11:41 AM  Result Value Ref Range Status   Specimen Description BLOOD LEFT WRIST  Final   Special Requests   Final    BOTTLES DRAWN AEROBIC AND ANAEROBIC AER 2CC ANA 1CC   Culture NO GROWTH < 24 HOURS  Final   Report Status PENDING  Incomplete  Culture, blood (routine x 2)     Status: None (Preliminary result)   Collection Time: 12/13/14 11:58 AM  Result Value Ref Range Status   Specimen Description BLOOD LEFT FATTY CASTS  Final   Special Requests BOTTLES DRAWN AEROBIC AND ANAEROBIC  2CC  Final   Culture NO GROWTH < 24 HOURS  Final   Report Status PENDING  Incomplete  MRSA PCR Screening     Status: None   Collection Time: 12/14/14  7:33 AM  Result Value Ref Range Status   MRSA by PCR NEGATIVE NEGATIVE Final    Comment:        The GeneXpert MRSA Assay (FDA approved for NASAL specimens only), is one component of a comprehensive MRSA colonization surveillance program. It is not intended to diagnose MRSA infection nor to guide or monitor treatment for MRSA infections.  Coagulation Studies: No results for input(s): LABPROT, INR in the last 72 hours.  Urinalysis: No results for input(s): COLORURINE, LABSPEC, PHURINE, GLUCOSEU, HGBUR, BILIRUBINUR, KETONESUR, PROTEINUR, UROBILINOGEN, NITRITE, LEUKOCYTESUR in the last 72 hours.  Invalid input(s): APPERANCEUR    Imaging: Ct Angio Chest Pe W/cm &/or Wo Cm  12/13/2014   CLINICAL DATA:  Shortness of breath for several days. Hypoxia. History of end-stage renal disease on dialysis.  EXAM: CT ANGIOGRAPHY CHEST WITH CONTRAST  TECHNIQUE: Multidetector CT imaging of the chest was performed using the standard protocol during bolus administration of intravenous contrast. Multiplanar CT image reconstructions and MIPs were obtained to evaluate  the vascular anatomy.  CONTRAST:  OMNIPAQUE IOHEXOL 350 MG/ML SOLN  COMPARISON:  Chest radiograph earlier today.  Chest CT 03/19/2013.  FINDINGS: Examination is partially limited by respiratory motion artifact and patient body habitus. No definite segmental or more proximal pulmonary emboli are identified.  Heart is mildly enlarged. Aortic and coronary artery calcification is noted. There is no pleural or pericardial effusion. Right azygoesophageal recess lymph node is mildly increased in size from the prior CT, measuring 1.5 cm in short axis (previously 10 mm). There is no pleural or pericardial effusion.  Evaluation of the lung parenchyma is mildly limited by motion artifact. There are patchy consolidative and ground-glass opacities bilaterally, predominantly in the upper lobes although with some involvement of the lower lobes as well and with some areas of air bronchograms. Mild scarring or atelectasis is noted in the right middle lobe and lingula.  The visualized portion of the upper abdomen is unremarkable. Diffusely increased osseous density may reflect underlying renal osteodystrophy.  Review of the MIP images confirms the above findings.  IMPRESSION: 1. Mildly motion degraded examination. No evidence of central pulmonary emboli. 2. Patchy consolidative and ground-glass opacities bilaterally, predominantly in the upper lobes and suspicious for multifocal pneumonia, possibly with superimposed edema.   Electronically Signed   By: Sebastian Ache   On: 12/13/2014 15:18   Dg Chest Port 1 View  12/13/2014   CLINICAL DATA:  Shortness of breath for several days. Cough. Hypoxia. History of end-stage renal disease on dialysis.  EXAM: PORTABLE CHEST - 1 VIEW  COMPARISON:  05/04/2014  FINDINGS: Right jugular catheter has been removed. Cardiac silhouette remains mildly enlarged. Pulmonary vascular congestion has mildly increased from the prior study and there are mildly increased interstitial densities throughout  both lungs. No segmental airspace consolidation, sizeable pleural effusion, or pneumothorax is identified. Lungs are mildly hypoinflated. No acute osseous abnormality is seen.  IMPRESSION: Increased pulmonary vascular congestion with mild interstitial edema.   Electronically Signed   By: Sebastian Ache   On: 12/13/2014 11:48     Medications:     . acetaminophen  650 mg Oral BID  . aspirin EC  81 mg Oral Daily  . azithromycin  250 mg Intravenous Q24H  . cefTRIAXone (ROCEPHIN) IVPB 1 gram/50 mL D5W  1 g Intravenous Q24H  . cinacalcet  60 mg Oral Daily  . epoetin (EPOGEN/PROCRIT) injection  10,000 Units Intravenous Once  . gemfibrozil  600 mg Oral BID AC  . heparin  5,000 Units Subcutaneous 3 times per day  . loratadine  10 mg Oral Daily  . oxybutynin  5 mg Oral Daily  . pantoprazole  40 mg Oral Daily  . polyethylene glycol  17 g Oral BID  . senna-docusate  1 tablet Oral BID  . sevelamer carbonate  1,600 mg Oral TID WC  . sodium chloride  3 mL Intravenous Q12H   sodium chloride, albuterol, alum & mag hydroxide-simeth, barrier cream, HYDROcodone-acetaminophen, naphazoline, sodium chloride  Assessment/ Plan:  Mr. Andrew [name] is a 64 y.o. black male with end-stage renal disease, on hemodialysis Monday, Wednesday, and Friday, hypertension, FSGS, benign prostatic hypertrophy, hypothyroidism, history of congestive heart failure, history of cerebral palsy, history of sarcoidosis, gastroesophageal reflux disease, anemia of chronic kidney disease, secondary hyperparathyroidism. LUE AVF resected 12/07/11, paroxysmal A Fib  [facility] [address]//MWF  1. ESRD on HD  With history of hyperkalemia: potassium currently at goal.  - Schedule patient for dialysis later today. Continue MWF schedule. Orders prepared.   2. SHPTH: with history of hyperphosphatemia. On cinacalcet and sevelamer - Check PTH and phos on HD.   3. AOCKD- hgb 9 - epo with hemodialysis.   4.  hypotension:  continue midodrine before dialysis.   5. Pneumonia: afebrile. No leukocytosis. Empirically on ceftriaxone and azithromycin.    LOS: 1 Andrew [name] 7/25/201610:54 AM

## 2014-12-14 NOTE — Progress Notes (Signed)
Initial Nutrition Assessment    INTERVENTION:   Meals and snacks: Cater to pt preferences   NUTRITION DIAGNOSIS:    (None at this time) related to   as evidenced by  .    GOAL:   Patient will meet greater than or equal to 90% of their needs    MONITOR:    (Energy intake, Electrolyte and renal profile, Anthropometric)  REASON FOR ASSESSMENT:   Other (Comment) (renal diet order)    ASSESSMENT:   Pt admitted with SOB, cough, pulmonary edema  Past Medical History  Diagnosis Date  . End stage renal disease   . Obesity, morbid   . GERD (gastroesophageal reflux disease)   . Cerebral palsy   . Sarcoidosis   . Hyperlipidemia   . Secondary hyperparathyroidism (of renal origin)   . Anemia in chronic kidney disease(285.21)   . 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 CHF (congestive heart failure)   . Sleep apnea   . Hypertension   . Renal insufficiency     Current Nutrition: ate 100% of breakfast this am per documentation.  Pt not in room this pm during rounds  Food/Nutrition-Related History: unsure intake prior to admission. Per MST no wt loss prior to admission   Medications: renvela, senokot, sensipar, miralax  Electrolyte/Renal Profile and Glucose Profile:   Recent Labs Lab 12/13/14 1141 12/14/14 0544  NA 138 139  K 4.2 5.1  CL 95* 94*  CO2 26 27  BUN 64* 77*  CREATININE 9.41* 10.86*  CALCIUM 8.1* 8.2*  GLUCOSE 100* 103*   Protein Profile:  Recent Labs Lab 12/13/14 1141  ALBUMIN 2.8*    Gastrointestinal Profile: Last BM: 7/22   Nutrition-Focused Physical Exam Findings:  Unable to complete Nutrition-Focused physical exam at this time.     Weight Change: wt encounters reviewed   Diet Order:  Diet renal with fluid restriction Fluid restriction:: 1200 mL Fluid; Room service appropriate?: Yes; Fluid consistency:: Thin  Skin:  Reviewed, no issues   Height:   Ht Readings from Last 1 Encounters:  12/13/14  (1.549 m)     Weight:   Wt Readings from Last 1 Encounters:  12/14/14 247 lb 4.8 oz (112.175 kg)      Wt Readings from Last 10 Encounters:  12/14/14 247 lb 4.8 oz (112.175 kg)  09/24/14 250 lb (113.399 kg)  01/14/14 249 lb 1.9 oz (113 kg)    BMI:  Body mass index is 46.75 kg/(m^2).  Estimated Nutritional Needs:   Kcal:  Using IBW of 51kg BEE 1168 kcals (IF 1.0-1.2, AF 1.3) 0981-1914 kcals/d  Protein:   Using IBW of 51kg (1.0-1.2 g/kg)51-61 g/d  Fluid:  Using IBW of 51kg (30-51ml/kg) 1530-1785 ml/d  EDUCATION NEEDS:   No education needs identified at this time  LOW Care Level  Jerimiah Wolman B. Freida Busman, RD, LDN [phone] (pager)

## 2014-12-15 LAB — HEPATITIS B CORE ANTIBODY, TOTAL: Hep B Core Total Ab: NEGATIVE

## 2014-12-15 LAB — EXPECTORATED SPUTUM ASSESSMENT W REFEX TO RESP CULTURE: SPECIAL REQUESTS: NORMAL

## 2014-12-15 LAB — EXPECTORATED SPUTUM ASSESSMENT W GRAM STAIN, RFLX TO RESP C

## 2014-12-15 LAB — HEPATITIS B SURFACE ANTIBODY,QUALITATIVE: HEP B S AB: REACTIVE

## 2014-12-15 LAB — HEPATITIS B SURFACE ANTIGEN: Hepatitis B Surface Ag: NEGATIVE

## 2014-12-15 LAB — PARATHYROID HORMONE, INTACT (NO CA): PTH: 143 pg/mL — ABNORMAL HIGH (ref 15–65)

## 2014-12-15 MED ORDER — LEVOFLOXACIN 250 MG PO TABS: 250.0000 mg | ORAL_TABLET | ORAL | Status: AC

## 2014-12-15 MED ORDER — HYDROCODONE-ACETAMINOPHEN 5-325 MG PO TABS: 1.0000 | ORAL_TABLET | ORAL | Status: AC | PRN

## 2014-12-15 NOTE — Progress Notes (Signed)
Pt. Refuses CPAP says he has one at home but doesn't use.

## 2014-12-15 NOTE — Care Management Important Message (Signed)
Important Message  Patient Details  Name: Andrew [name] MRN: [id number] Date of Birth: Sep 26, 1950   Medicare Important Message Given:  Yes-second notification given    Verita Schneiders Allmond 12/15/2014, 9:39 AM

## 2014-12-15 NOTE — Clinical Social Work Note (Signed)
Clinical Social Work Assessment  Patient Details  Name: Andrew [name] MRN: [id number] Date of Birth: [birth year]  Date of referral:  12/15/14               Reason for consult:  Facility Placement                Permission sought to share information with:  Facility Industrial/product designer granted to share information::  Yes, Verbal Permission Granted  Name::        Agency::     Relationship::     Contact Information:     Housing/Transportation Living arrangements for the past 2 months:  Skilled Nursing Facility Source of Information:  Patient Patient Interpreter Needed:  None Criminal Activity/Legal Involvement Pertinent to Current Situation/Hospitalization:  No - Comment as needed Significant Relationships:  None Lives with:  Facility Resident Do you feel safe going back to the place where you live?  Yes Need for family participation in patient care:  No (Coment)  Care giving concerns:  None; resident at [facility]   Social Worker assessment / plan:  Physician discharged patient to return to [facility] today. Rosey Bath at [facility] is aware and discharge summary sent. Patient wishes to transport via EMS today.   Employment status:  Disabled (Comment on whether or not currently receiving Disability) Insurance information:  Medicare PT Recommendations:  Not assessed at this time Information / Referral to community resources:     Patient/Family's Response to care:  Receptive  Patient/Family's Understanding of and Emotional Response to Diagnosis, Current Treatment, and Prognosis:  Patient expressed appreciation for CSW assistance.  Emotional Assessment Appearance:    Attitude/Demeanor/Rapport:   (pleasant and cooperative) Affect (typically observed):  Accepting, Appropriate Orientation:  Oriented to Self, Oriented to Place, Oriented to  Time, Oriented to Situation Alcohol / Substance use:  Not Applicable Psych involvement (Current and /or in the community):  No  (Comment)  Discharge Needs  Concerns to be addressed:  No discharge needs identified Readmission within the last 30 days:  No Current discharge risk:  None Barriers to Discharge:  No Barriers Identified   York Spaniel, LCSW 12/15/2014, 11:31 AM

## 2014-12-15 NOTE — Discharge Instructions (Addendum)
°  DIET:  Cardiac diet and Renal diet  DISCHARGE CONDITION:  Good  ACTIVITY:  Activity as tolerated  OXYGEN:  Home Oxygen: Yes.     Oxygen Delivery: 2 liters/min via Patient connected to nasal cannula oxygen  DISCHARGE LOCATION:  nursing home   If you experience worsening of your admission symptoms, develop shortness of breath, life threatening emergency, suicidal or homicidal thoughts you must seek medical attention immediately by calling 911 or calling your MD immediately  if symptoms less severe.  You Must read complete instructions/literature along with all the possible adverse reactions/side effects for all the Medicines you take and that have been prescribed to you. Take any new Medicines after you have completely understood and accpet all the possible adverse reactions/side effects.   Please note  You were cared for by a hospitalist during your hospital stay. If you have any questions about your discharge medications or the care you received while you were in the hospital after you are discharged, you can call the unit and asked to speak with the hospitalist on call if the hospitalist that took care of you is not available. Once you are discharged, your primary care physician will handle any further medical issues. Please note that NO REFILLS for any discharge medications will be authorized once you are discharged, as it is imperative that you return to your primary care physician (or establish a relationship with a primary care physician if you do not have one) for your aftercare needs so that they can reassess your need for medications and monitor your lab values.

## 2014-12-15 NOTE — Progress Notes (Signed)
[facility]  ROUNDING NOTE   Subjective:   Hemodialysis yesterday. Tolerated treatment well. UF goal of 2 litres.  Cough improved.   Objective:  Vital signs in last 24 hours:  Temp:  [97.5 F (36.4 C)-97.8 F (36.6 C)] 97.8 F (36.6 C) (07/26 0812) Pulse Rate:  [69-88] 73 (07/26 0812) Resp:  [16-23] 18 (07/26 0812) BP: (92-132)/(59-97) 132/62 mmHg (07/26 0812) SpO2:  [90 %-92 %] 91 % (07/26 0812) Weight:  [111.8 kg (246 lb 7.6 oz)-113.6 kg (250 lb 7.1 oz)] 111.8 kg (246 lb 7.6 oz) (07/25 1645)  Weight change: 0.201 kg (7.1 oz) Filed Weights   12/14/14 0003 12/14/14 1330 12/14/14 1645  Weight: 112.175 kg (247 lb 4.8 oz) 113.6 kg (250 lb 7.1 oz) 111.8 kg (246 lb 7.6 oz)    Intake/Output: I/O last 3 completed shifts: In: 290 [P.O.:290] Out: 2000 [Other:2000]   Intake/Output this shift:     Physical Exam: General: NAD  Head: Normocephalic, atraumatic. Moist oral mucosal membranes  Eyes: Anicteric, PERRL  Neck: Supple, trachea midline  Lungs:  Course breath sounds  Heart: Regular rate and rhythm  Abdomen:  Soft, nontender  Extremities: no peripheral edema.  Neurologic: Nonfocal, moving all four extremities  Skin: No lesions  Access: Right forearm AVG    Basic Metabolic Panel:  Recent Labs Lab 12/13/14 1141 12/14/14 0544 12/14/14 1344  NA 138 139  --   K 4.2 5.1  --   CL 95* 94*  --   CO2 26 27  --   GLUCOSE 100* 103*  --   BUN 64* 77*  --   CREATININE 9.41* 10.86*  --   CALCIUM 8.1* 8.2*  --   PHOS  --   --  7.8*    Liver Function Tests:  Recent Labs Lab 12/13/14 1141  AST 10*  ALT 7*  ALKPHOS 179*  BILITOT 0.5  PROT 8.5*  ALBUMIN 2.8*   No results for input(s): LIPASE, AMYLASE in the last 168 hours. No results for input(s): AMMONIA in the last 168 hours.  CBC:  Recent Labs Lab 12/13/14 1141  WBC 7.0  NEUTROABS 5.0  HGB 9.0*  HCT 28.0*  MCV 86.1  PLT 282    Cardiac Enzymes:  Recent Labs Lab 12/13/14 1141   TROPONINI 0.05*    BNP: Invalid input(s): POCBNP  CBG: No results for input(s): GLUCAP in the last 168 hours.  Microbiology: Results for orders placed or performed during the hospital encounter of 12/13/14  Culture, blood (routine x 2)     Status: None (Preliminary result)   Collection Time: 12/13/14 11:41 AM  Result Value Ref Range Status   Specimen Description BLOOD LEFT WRIST  Final   Special Requests   Final    BOTTLES DRAWN AEROBIC AND ANAEROBIC AER 2CC ANA 1CC   Culture NO GROWTH 2 DAYS  Final   Report Status PENDING  Incomplete  Culture, blood (routine x 2)     Status: None (Preliminary result)   Collection Time: 12/13/14 11:58 AM  Result Value Ref Range Status   Specimen Description BLOOD LEFT FATTY CASTS  Final   Special Requests BOTTLES DRAWN AEROBIC AND ANAEROBIC  2CC  Final   Culture NO GROWTH 2 DAYS  Final   Report Status PENDING  Incomplete  MRSA PCR Screening     Status: None   Collection Time: 12/14/14  7:33 AM  Result Value Ref Range Status   MRSA by PCR NEGATIVE NEGATIVE Final    Comment:  The GeneXpert MRSA Assay (FDA approved for NASAL specimens only), is one component of a comprehensive MRSA colonization surveillance program. It is not intended to diagnose MRSA infection nor to guide or monitor treatment for MRSA infections.   Culture, expectorated sputum-assessment     Status: None   Collection Time: 12/14/14  9:45 PM  Result Value Ref Range Status   Specimen Description EXPECTORATED SPUTUM  Final   Special Requests Normal  Final   Sputum evaluation THIS SPECIMEN IS ACCEPTABLE FOR SPUTUM CULTURE  Final   Report Status 12/15/2014 FINAL  Final  Culture, respiratory (NON-Expectorated)     Status: None (Preliminary result)   Collection Time: 12/14/14  9:45 PM  Result Value Ref Range Status   Specimen Description EXPECTORATED SPUTUM  Final   Special Requests Normal Reflexed from [id number]  Final   Gram Stain PENDING  Incomplete   Culture  HOLDING FOR POSSIBLE PATHOGEN  Final   Report Status PENDING  Incomplete    Coagulation Studies: No results for input(s): LABPROT, INR in the last 72 hours.  Urinalysis: No results for input(s): COLORURINE, LABSPEC, PHURINE, GLUCOSEU, HGBUR, BILIRUBINUR, KETONESUR, PROTEINUR, UROBILINOGEN, NITRITE, LEUKOCYTESUR in the last 72 hours.  Invalid input(s): APPERANCEUR    Imaging: Ct Angio Chest Pe W/cm &/or Wo Cm  12/13/2014   CLINICAL DATA:  Shortness of breath for several days. Hypoxia. History of end-stage renal disease on dialysis.  EXAM: CT ANGIOGRAPHY CHEST WITH CONTRAST  TECHNIQUE: Multidetector CT imaging of the chest was performed using the standard protocol during bolus administration of intravenous contrast. Multiplanar CT image reconstructions and MIPs were obtained to evaluate the vascular anatomy.  CONTRAST:  OMNIPAQUE IOHEXOL 350 MG/ML SOLN  COMPARISON:  Chest radiograph earlier today.  Chest CT 03/19/2013.  FINDINGS: Examination is partially limited by respiratory motion artifact and patient body habitus. No definite segmental or more proximal pulmonary emboli are identified.  Heart is mildly enlarged. Aortic and coronary artery calcification is noted. There is no pleural or pericardial effusion. Right azygoesophageal recess lymph node is mildly increased in size from the prior CT, measuring 1.5 cm in short axis (previously 10 mm). There is no pleural or pericardial effusion.  Evaluation of the lung parenchyma is mildly limited by motion artifact. There are patchy consolidative and ground-glass opacities bilaterally, predominantly in the upper lobes although with some involvement of the lower lobes as well and with some areas of air bronchograms. Mild scarring or atelectasis is noted in the right middle lobe and lingula.  The visualized portion of the upper abdomen is unremarkable. Diffusely increased osseous density may reflect underlying renal osteodystrophy.  Review of the MIP  images confirms the above findings.  IMPRESSION: 1. Mildly motion degraded examination. No evidence of central pulmonary emboli. 2. Patchy consolidative and ground-glass opacities bilaterally, predominantly in the upper lobes and suspicious for multifocal pneumonia, possibly with superimposed edema.   Electronically Signed   By: Sebastian Ache   On: 12/13/2014 15:18   Dg Chest Port 1 View  12/13/2014   CLINICAL DATA:  Shortness of breath for several days. Cough. Hypoxia. History of end-stage renal disease on dialysis.  EXAM: PORTABLE CHEST - 1 VIEW  COMPARISON:  05/04/2014  FINDINGS: Right jugular catheter has been removed. Cardiac silhouette remains mildly enlarged. Pulmonary vascular congestion has mildly increased from the prior study and there are mildly increased interstitial densities throughout both lungs. No segmental airspace consolidation, sizeable pleural effusion, or pneumothorax is identified. Lungs are mildly hypoinflated. No acute osseous abnormality  is seen.  IMPRESSION: Increased pulmonary vascular congestion with mild interstitial edema.   Electronically Signed   By: Sebastian Ache   On: 12/13/2014 11:48     Medications:     . acetaminophen  650 mg Oral BID  . aspirin EC  81 mg Oral Daily  . azithromycin  250 mg Intravenous Q24H  . cefTRIAXone (ROCEPHIN) IVPB 1 gram/50 mL D5W  1 g Intravenous Q24H  . cinacalcet  60 mg Oral Daily  . gemfibrozil  600 mg Oral BID AC  . heparin  5,000 Units Subcutaneous 3 times per day  . loratadine  10 mg Oral Daily  . midodrine  5 mg Oral Once per day on Mon Wed Fri  . oxybutynin  5 mg Oral Daily  . pantoprazole  40 mg Oral Daily  . polyethylene glycol  17 g Oral BID  . senna-docusate  1 tablet Oral BID  . sevelamer carbonate  1,600 mg Oral TID WC  . sodium chloride  3 mL Intravenous Q12H   sodium chloride, acetaminophen, albuterol, alum & mag hydroxide-simeth, barrier cream, HYDROcodone-acetaminophen, naphazoline, sodium chloride  Assessment/  Plan:  Mr. Andrew [name] is a 64 y.o. black male with end-stage renal disease, on hemodialysis Monday, Wednesday, and Friday, hypertension, FSGS, benign prostatic hypertrophy, hypothyroidism, history of congestive heart failure, history of cerebral palsy, history of sarcoidosis, gastroesophageal reflux disease, anemia of chronic kidney disease, secondary hyperparathyroidism. LUE AVF resected 12/07/11, paroxysmal A Fib  [facility] [address]//MWF  1. ESRD on HD  With history of hyperkalemia: potassium currently at goal.  - Hemodialysis yesterday. Continue MWF schedule.   2. SHPTH: with history of hyperphosphatemia. On cinacalcet and sevelamer - phos elevated at 7.8  3. AOCKD- hgb 9 - epo with hemodialysis.   4.  hypotension: continue midodrine before dialysis.   5. Pneumonia: afebrile. No leukocytosis. Empirically on ceftriaxone and azithromycin.  - outpatient antibiotics as per Hospitalist.    LOS: 2 Andrew [name] 7/26/201610:07 AM

## 2014-12-15 NOTE — Progress Notes (Signed)
Pt d/c'd to SNF today. IV removed & intact. Report called to nurse Okey Regal at [facility] facility. Ambulance called for transport to SNF facility. Paperwork given to Emergency planning/management officer. Pt on 4L 02.

## 2014-12-15 NOTE — Discharge Summary (Signed)
[facility] [place] at [facility]   PATIENT NAME: Andrew [name]    MR#:  [id number]  DATE OF BIRTH:  [birth year]  DATE OF ADMISSION:  12/13/2014 ADMITTING PHYSICIAN: Gracelyn Nurse, MD  DATE OF DISCHARGE: 12/15/2014  PRIMARY CARE PHYSICIAN: Derwood Kaplan, MD    ADMISSION DIAGNOSIS:  Community acquired pneumonia [J18.9] Hypoxia [R09.02]  DISCHARGE DIAGNOSIS:  Active Problems:   Acute on chronic respiratory failure   SECONDARY DIAGNOSIS:   Past Medical History  Diagnosis Date  . End stage renal disease   . Obesity, morbid   . GERD (gastroesophageal reflux disease)   . Cerebral palsy   . Sarcoidosis   . Hyperlipidemia   . Secondary hyperparathyroidism (of renal origin)   . Anemia in chronic kidney disease(285.21)   . 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 CHF (congestive heart failure)   . Sleep apnea   . Hypertension   . Renal insufficiency     HOSPITAL COURSE:   64 year old male with past medical history of end-stage renal disease on hemodialysis, obstructive sleep apnea, GERD, secondary hyperparathyroidism, urinary incontinence, hyperlipidemia, history of sarcoidosis, history of CHF, who presented to the hospital due to shortness of breath cough.  #1 acute on chronic respiratory failure-this is likely secondary to pneumonia/mild CHF. -Patient was treated with IV ceftriaxone/Zithromax for the pneumonia and also had hemodialysis for fluid removal and has clinically improved and feels better. He is being discharged back to the skilled nursing facility for ongoing care.  #2 pneumonia-patient was treated with IV ceftriaxone/Zithromax in the hospital and he is being discharged on oral Levaquin presently. Patient's shortness of breath, cough has improved.  #3 CHF-acute on chronic diastolic CHF. -This has improved with hemodialysis and can be further followed as an outpatient  #4 and stage renal disease on hemodialysis-patient is  on Monday Wednesday Friday schedule. -Nephrology was consultation patient had hemodialysis and will resume his schedule as mentioned above  #5 secondary hyperparathyroidism-continue Sensipar, Renvela.  #6 GERD-continue Protonix.  #7 hyperlipidemia-continue gemfibrozil.  DISCHARGE CONDITIONS:   Stable  CONSULTS OBTAINED:  Treatment Team:  Lamont Dowdy, MD  DRUG ALLERGIES:   Allergies  Allergen Reactions  . Codeine Other (See Comments)    Per MAR    DISCHARGE MEDICATIONS:   Current Discharge Medication List    START taking these medications   Details  levofloxacin (LEVAQUIN) 250 MG tablet Take 1 tablet (250 mg total) by mouth every other day. Qty: 5 tablet, Refills: 0      CONTINUE these medications which have CHANGED   Details  HYDROcodone-acetaminophen (NORCO/VICODIN) 5-325 MG per tablet Take 1 tablet by mouth every 4 (four) hours as needed for moderate pain. Qty: 30 tablet, Refills: 0      CONTINUE these medications which have NOT CHANGED   Details  acetaminophen (TYLENOL) 325 MG tablet Take 650 mg by mouth 2 (two) times daily. scheduled    albuterol (PROVENTIL) (2.5 MG/3ML) 0.083% nebulizer solution Take 3 mLs (2.5 mg total) by nebulization every 4 (four) hours as needed for wheezing or shortness of breath. Qty: 75 mL, Refills: 12    alum & mag hydroxide-simeth (MYLANTA) 200-200-20 MG/5ML suspension Take 30 mLs by mouth every 6 (six) hours as needed for indigestion or heartburn.    aspirin 81 MG tablet Take 81 mg by mouth daily.    cinacalcet (SENSIPAR) 30 MG tablet Take 60 mg by mouth daily.    gemfibrozil (LOPID) 600 MG tablet Take 600 mg  by mouth 2 (two) times daily before a meal.    lidocaine-prilocaine (EMLA) cream Apply 1 application topically every Monday, Wednesday, and Friday. Apply to fistula site    loratadine (CLARITIN) 10 MG tablet Take 10 mg by mouth daily.    omeprazole (PRILOSEC OTC) 20 MG tablet Take 20 mg by mouth daily.     oxybutynin (DITROPAN-XL) 5 MG 24 hr tablet Take 5 mg by mouth daily.     polyethylene glycol (MIRALAX / GLYCOLAX) packet Take 17 g by mouth 2 (two) times daily. Qty: 14 each, Refills: 0    sennosides-docusate sodium (SENOKOT-S) 8.6-50 MG tablet Take 1 tablet by mouth 2 (two) times daily.     sevelamer carbonate (RENVELA) 800 MG tablet Take 1,600 mg by mouth 3 (three) times daily with meals.      STOP taking these medications     azithromycin (ZITHROMAX) 250 MG tablet          DISCHARGE INSTRUCTIONS:   DIET:  Cardiac diet and Renal diet  DISCHARGE CONDITION:  Stable  ACTIVITY:  Activity as tolerated  OXYGEN:  Home Oxygen: Yes.     Oxygen Delivery: 2 liters/min via Patient connected to nasal cannula oxygen  DISCHARGE LOCATION:  nursing home   If you experience worsening of your admission symptoms, develop shortness of breath, life threatening emergency, suicidal or homicidal thoughts you must seek medical attention immediately by calling 911 or calling your MD immediately  if symptoms less severe.  You Must read complete instructions/literature along with all the possible adverse reactions/side effects for all the Medicines you take and that have been prescribed to you. Take any new Medicines after you have completely understood and accpet all the possible adverse reactions/side effects.   Please note  You were cared for by a hospitalist during your hospital stay. If you have any questions about your discharge medications or the care you received while you were in the hospital after you are discharged, you can call the unit and asked to speak with the hospitalist on call if the hospitalist that took care of you is not available. Once you are discharged, your primary care physician will handle any further medical issues. Please note that NO REFILLS for any discharge medications will be authorized once you are discharged, as it is imperative that you return to your primary  care physician (or establish a relationship with a primary care physician if you do not have one) for your aftercare needs so that they can reassess your need for medications and monitor your lab values.     Today   Patient feels better. Still has a cough but is nonproductive. Afebrile, hemodynamically stable. Shortness of breath much improved  VITAL SIGNS:  Blood pressure 132/62, pulse 73, temperature 97.8 F (36.6 C), temperature source Axillary, resp. rate 18, height 5\' 1"  (1.549 m), weight 111.8 kg (246 lb 7.6 oz), SpO2 91 %.  I/O:   Intake/Output Summary (Last 24 hours) at 12/15/14 1011 Last data filed at 12/15/14 0434  Gross per 24 hour  Intake     50 ml  Output   2000 ml  Net  -1950 ml    PHYSICAL EXAMINATION:  GENERAL:  64 y.o.-year-old patient morbidly obese lying in the bed with no acute distress.  EYES: Pupils equal, round, reactive to light and accommodation. No scleral icterus. Extraocular muscles intact.  HEENT: Head atraumatic, normocephalic. Oropharynx and nasopharynx clear.  NECK:  Supple, no jugular venous distention. No thyroid enlargement, no tenderness.  LUNGS: Normal breath sounds bilaterally, no wheezing, rales,rhonchi. No use of accessory muscles of respiration.  CARDIOVASCULAR: S1, S2 normal. No murmurs, rubs, or gallops.  ABDOMEN: Soft, non-tender, non-distended. Bowel sounds present. No organomegaly or mass.  EXTREMITIES: No pedal edema, cyanosis, or clubbing.  NEUROLOGIC: Cranial nerves II through XII are intact. No focal motor or sensory defecits b/l.  PSYCHIATRIC: The patient is alert and oriented x 3. Good affect.  SKIN: No obvious rash, lesion, or ulcer.   DATA REVIEW:   CBC  Recent Labs Lab 12/13/14 1141  WBC 7.0  HGB 9.0*  HCT 28.0*  PLT 282    Chemistries   Recent Labs Lab 12/13/14 1141 12/14/14 0544  NA 138 139  K 4.2 5.1  CL 95* 94*  CO2 26 27  GLUCOSE 100* 103*  BUN 64* 77*  CREATININE 9.41* 10.86*  CALCIUM 8.1* 8.2*   AST 10*  --   ALT 7*  --   ALKPHOS 179*  --   BILITOT 0.5  --     Cardiac Enzymes  Recent Labs Lab 12/13/14 1141  TROPONINI 0.05*    Microbiology Results  Results for orders placed or performed during the hospital encounter of 12/13/14  Culture, blood (routine x 2)     Status: None (Preliminary result)   Collection Time: 12/13/14 11:41 AM  Result Value Ref Range Status   Specimen Description BLOOD LEFT WRIST  Final   Special Requests   Final    BOTTLES DRAWN AEROBIC AND ANAEROBIC AER 2CC ANA 1CC   Culture NO GROWTH 2 DAYS  Final   Report Status PENDING  Incomplete  Culture, blood (routine x 2)     Status: None (Preliminary result)   Collection Time: 12/13/14 11:58 AM  Result Value Ref Range Status   Specimen Description BLOOD LEFT FATTY CASTS  Final   Special Requests BOTTLES DRAWN AEROBIC AND ANAEROBIC  2CC  Final   Culture NO GROWTH 2 DAYS  Final   Report Status PENDING  Incomplete  MRSA PCR Screening     Status: None   Collection Time: 12/14/14  7:33 AM  Result Value Ref Range Status   MRSA by PCR NEGATIVE NEGATIVE Final    Comment:        The GeneXpert MRSA Assay (FDA approved for NASAL specimens only), is one component of a comprehensive MRSA colonization surveillance program. It is not intended to diagnose MRSA infection nor to guide or monitor treatment for MRSA infections.   Culture, expectorated sputum-assessment     Status: None   Collection Time: 12/14/14  9:45 PM  Result Value Ref Range Status   Specimen Description EXPECTORATED SPUTUM  Final   Special Requests Normal  Final   Sputum evaluation THIS SPECIMEN IS ACCEPTABLE FOR SPUTUM CULTURE  Final   Report Status 12/15/2014 FINAL  Final  Culture, respiratory (NON-Expectorated)     Status: None (Preliminary result)   Collection Time: 12/14/14  9:45 PM  Result Value Ref Range Status   Specimen Description EXPECTORATED SPUTUM  Final   Special Requests Normal Reflexed from [id number]  Final   Gram Stain  PENDING  Incomplete   Culture HOLDING FOR POSSIBLE PATHOGEN  Final   Report Status PENDING  Incomplete    RADIOLOGY:  Ct Angio Chest Pe W/cm &/or Wo Cm  12/13/2014   CLINICAL DATA:  Shortness of breath for several days. Hypoxia. History of end-stage renal disease on dialysis.  EXAM: CT ANGIOGRAPHY CHEST WITH CONTRAST  TECHNIQUE: Multidetector CT imaging  of the chest was performed using the standard protocol during bolus administration of intravenous contrast. Multiplanar CT image reconstructions and MIPs were obtained to evaluate the vascular anatomy.  CONTRAST:  OMNIPAQUE IOHEXOL 350 MG/ML SOLN  COMPARISON:  Chest radiograph earlier today.  Chest CT 03/19/2013.  FINDINGS: Examination is partially limited by respiratory motion artifact and patient body habitus. No definite segmental or more proximal pulmonary emboli are identified.  Heart is mildly enlarged. Aortic and coronary artery calcification is noted. There is no pleural or pericardial effusion. Right azygoesophageal recess lymph node is mildly increased in size from the prior CT, measuring 1.5 cm in short axis (previously 10 mm). There is no pleural or pericardial effusion.  Evaluation of the lung parenchyma is mildly limited by motion artifact. There are patchy consolidative and ground-glass opacities bilaterally, predominantly in the upper lobes although with some involvement of the lower lobes as well and with some areas of air bronchograms. Mild scarring or atelectasis is noted in the right middle lobe and lingula.  The visualized portion of the upper abdomen is unremarkable. Diffusely increased osseous density may reflect underlying renal osteodystrophy.  Review of the MIP images confirms the above findings.  IMPRESSION: 1. Mildly motion degraded examination. No evidence of central pulmonary emboli. 2. Patchy consolidative and ground-glass opacities bilaterally, predominantly in the upper lobes and suspicious for multifocal pneumonia,  possibly with superimposed edema.   Electronically Signed   By: Sebastian Ache   On: 12/13/2014 15:18   Dg Chest Port 1 View  12/13/2014   CLINICAL DATA:  Shortness of breath for several days. Cough. Hypoxia. History of end-stage renal disease on dialysis.  EXAM: PORTABLE CHEST - 1 VIEW  COMPARISON:  05/04/2014  FINDINGS: Right jugular catheter has been removed. Cardiac silhouette remains mildly enlarged. Pulmonary vascular congestion has mildly increased from the prior study and there are mildly increased interstitial densities throughout both lungs. No segmental airspace consolidation, sizeable pleural effusion, or pneumothorax is identified. Lungs are mildly hypoinflated. No acute osseous abnormality is seen.  IMPRESSION: Increased pulmonary vascular congestion with mild interstitial edema.   Electronically Signed   By: Sebastian Ache   On: 12/13/2014 11:48      Management plans discussed with the patient, family and they are in agreement.  CODE STATUS:     Code Status Orders        Start     Ordered   12/13/14 2328  Full code   Continuous     12/13/14 2327      TOTAL TIME TAKING CARE OF THIS PATIENT: 40 minutes.    Houston Siren M.D on 12/15/2014 at 10:11 AM  Between 7am to 6pm - Pager - [phone]  After 6pm go to [email or web address] - password [email or web address] [facility]  [facility]  Office  [phone]  CC: Primary care physician; Derwood Kaplan, MD

## 2014-12-16 LAB — CULTURE, RESPIRATORY W GRAM STAIN

## 2014-12-16 LAB — CULTURE, RESPIRATORY
CULTURE: NORMAL
Special Requests: NORMAL

## 2014-12-18 LAB — CULTURE, BLOOD (ROUTINE X 2)
CULTURE: NO GROWTH
Culture: NO GROWTH

## 2015-01-19 ENCOUNTER — Encounter: Payer: Self-pay | Admitting: *Deleted

## 2015-01-19 ENCOUNTER — Ambulatory Visit
Admission: RE | Admit: 2015-01-19 | Discharge: 2015-01-19 | Disposition: A | Discharge status: Skilled Nursing Facility | Payer: Medicare Other | Source: Ambulatory Visit | Attending: Vascular Surgery | Admitting: Vascular Surgery

## 2015-01-19 ENCOUNTER — Encounter
Admission: RE | Disposition: A | Discharge status: Skilled Nursing Facility | Payer: Self-pay | Source: Ambulatory Visit | Attending: Vascular Surgery

## 2015-01-19 DIAGNOSIS — E785 Hyperlipidemia, unspecified: Secondary | ICD-10-CM | POA: Insufficient documentation

## 2015-01-19 DIAGNOSIS — Z992 Dependence on renal dialysis: Secondary | ICD-10-CM | POA: Insufficient documentation

## 2015-01-19 DIAGNOSIS — N186 End stage renal disease: Secondary | ICD-10-CM | POA: Insufficient documentation

## 2015-01-19 DIAGNOSIS — Z87891 Personal history of nicotine dependence: Secondary | ICD-10-CM | POA: Insufficient documentation

## 2015-01-19 DIAGNOSIS — Z79899 Other long term (current) drug therapy: Secondary | ICD-10-CM | POA: Diagnosis not present

## 2015-01-19 DIAGNOSIS — J449 Chronic obstructive pulmonary disease, unspecified: Secondary | ICD-10-CM | POA: Diagnosis not present

## 2015-01-19 DIAGNOSIS — T82858A Stenosis of vascular prosthetic devices, implants and grafts, initial encounter: Secondary | ICD-10-CM | POA: Insufficient documentation

## 2015-01-19 DIAGNOSIS — I120 Hypertensive chronic kidney disease with stage 5 chronic kidney disease or end stage renal disease: Secondary | ICD-10-CM | POA: Insufficient documentation

## 2015-01-19 HISTORY — PX: PERIPHERAL VASCULAR CATHETERIZATION: SHX172C

## 2015-01-19 LAB — POTASSIUM (ARMC VASCULAR LAB ONLY): POTASSIUM (ARMC VASCULAR LAB): 4.8

## 2015-01-19 SURGERY — A/V SHUNTOGRAM/FISTULAGRAM
Anesthesia: Moderate Sedation | Laterality: Right

## 2015-01-19 MED ORDER — LIDOCAINE HCL (PF) 1 % IJ SOLN
INTRAMUSCULAR | Status: AC
Start: 2015-01-19 — End: 2015-01-19
  Filled 2015-01-19: qty 10

## 2015-01-19 MED ORDER — MIDAZOLAM HCL 2 MG/2ML IJ SOLN
INTRAMUSCULAR | Status: DC | PRN
Start: 2015-01-19 — End: 2015-01-19
  Administered 2015-01-19: 2 mg via INTRAVENOUS

## 2015-01-19 MED ORDER — FENTANYL CITRATE (PF) 100 MCG/2ML IJ SOLN
INTRAMUSCULAR | Status: AC
  Filled 2015-01-19: qty 2

## 2015-01-19 MED ORDER — CEFAZOLIN SODIUM 1-5 GM-% IV SOLN
1.0000 g | Freq: Once | INTRAVENOUS | Status: AC
  Administered 2015-01-19: 1 g via INTRAVENOUS

## 2015-01-19 MED ORDER — CEFAZOLIN SODIUM 1-5 GM-% IV SOLN
INTRAVENOUS | Status: AC
  Filled 2015-01-19: qty 50

## 2015-01-19 MED ORDER — MIDAZOLAM HCL 2 MG/2ML IJ SOLN
INTRAMUSCULAR | Status: AC
  Filled 2015-01-19: qty 4

## 2015-01-19 MED ORDER — LIDOCAINE HCL (PF) 1 % IJ SOLN
INTRAMUSCULAR | Status: DC | PRN
  Administered 2015-01-19: 10 mL

## 2015-01-19 MED ORDER — HEPARIN SODIUM (PORCINE) 1000 UNIT/ML IJ SOLN
INTRAMUSCULAR | Status: DC | PRN
  Administered 2015-01-19: 3000 [IU] via INTRAVENOUS

## 2015-01-19 MED ORDER — ONDANSETRON HCL 4 MG/2ML IJ SOLN
INTRAMUSCULAR | Status: AC
  Administered 2015-01-19: 4 mg
  Filled 2015-01-19: qty 2

## 2015-01-19 MED ORDER — HEPARIN (PORCINE) IN NACL 2-0.9 UNIT/ML-% IJ SOLN
INTRAMUSCULAR | Status: AC
  Filled 2015-01-19: qty 1000

## 2015-01-19 MED ORDER — SODIUM CHLORIDE 0.9 % IJ SOLN
INTRAMUSCULAR | Status: AC
  Filled 2015-01-19: qty 3

## 2015-01-19 MED ORDER — HEPARIN SODIUM (PORCINE) 1000 UNIT/ML IJ SOLN
INTRAMUSCULAR | Status: AC
  Filled 2015-01-19: qty 1

## 2015-01-19 MED ORDER — SODIUM CHLORIDE 0.9 % IV SOLN
INTRAVENOUS | Status: DC
Start: 2015-01-19 — End: 2015-01-19
  Administered 2015-01-19: 14:00:00 via INTRAVENOUS

## 2015-01-19 MED ORDER — FENTANYL CITRATE (PF) 100 MCG/2ML IJ SOLN
INTRAMUSCULAR | Status: DC | PRN
  Administered 2015-01-19: 50 ug via INTRAVENOUS

## 2015-01-19 SURGICAL SUPPLY — 10 items
BALLN LUTONIX DCB 6X40X130 (BALLOONS) ×8
BALLOON LUTONIX DCB 6X40X130 (BALLOONS) ×4 IMPLANT
CATH BEACON 5.038 65CM KMP-01 (CATHETERS) ×4 IMPLANT
DEVICE PRESTO INFLATION (MISCELLANEOUS) ×4 IMPLANT
DRAPE BRACHIAL (DRAPES) ×4 IMPLANT
PACK ANGIOGRAPHY (CUSTOM PROCEDURE TRAY) ×4 IMPLANT
SET INTRO CAPELLA COAXIAL (SET/KITS/TRAYS/PACK) ×4 IMPLANT
SHEATH BRITE TIP 6FRX5.5 (SHEATH) ×4 IMPLANT
TOWEL OR 17X26 4PK STRL BLUE (TOWEL DISPOSABLE) ×4 IMPLANT
WIRE MAGIC TORQUE 260C (WIRE) ×4 IMPLANT

## 2015-01-19 NOTE — Discharge Instructions (Signed)
Fistulogram, Care After °Refer to this sheet in the next few weeks. These instructions provide you with information on caring for yourself after your procedure. Your health care provider may also give you more specific instructions. Your treatment has been planned according to current medical practices, but problems sometimes occur. Call your health care provider if you have any problems or questions after your procedure. °WHAT TO EXPECT AFTER THE PROCEDURE °After your procedure, it is typical to have the following: °· A small amount of discomfort in the area where the catheters were placed. °· A small amount of bruising around the fistula. °· Sleepiness and fatigue. °HOME CARE INSTRUCTIONS °· Rest at home for the day following your procedure. °· Do not drive or operate heavy machinery while taking pain medicine. °· Take medicines only as directed by your health care provider. °· Do not take baths, swim, or use a hot tub until your health care provider approves. You may shower 24 hours after the procedure or as directed by your health care provider. °· There are many different ways to close and cover an incision, including stitches, skin glue, and adhesive strips. Follow your health care provider's instructions on: °¨ Incision care. °¨ Bandage (dressing) changes and removal. °¨ Incision closure removal. °· Monitor your dialysis fistula carefully. °SEEK MEDICAL CARE IF: °· You have drainage, redness, swelling, or pain at your catheter site. °· You have a fever. °· You have chills. °SEEK IMMEDIATE MEDICAL CARE IF: °· You feel weak. °· You have trouble balancing. °· You have trouble moving your arms or legs. °· You have problems with your speech or vision. °· You can no longer feel a vibration or buzz when you put your fingers over your dialysis fistula. °· The limb that was used for the procedure: °¨ Swells. °¨ Is painful. °¨ Is cold. °¨ Is discolored, such as blue or pale white. °Document Released: 09/22/2013  Document Reviewed: 06/27/2013 °Exit[employer]® Patient Information ©2015 [employer]. This information is not intended to replace advice given to you by your health care provider. Make sure you discuss any questions you have with your health care provider. ° °

## 2015-01-19 NOTE — Op Note (Signed)
OPERATIVE NOTE   PROCEDURE: 1. Contrast injection right forearm AV loop graft 2. Percutaneous transluminal angioplasty of the venous outflow right forearm loop graft  PRE-OPERATIVE DIAGNOSIS: Complication of dialysis access                                                       End Stage Renal Disease  POST-OPERATIVE DIAGNOSIS: same as above   SURGEON: Katha Cabal, M.D.  ANESTHESIA: Conscious Sedation   ESTIMATED BLOOD LOSS: minimal  FINDING(S): 1. Stricture of the venous outflow  SPECIMEN(S):  None  CONTRAST: 30 cc  FLUOROSCOPY TIME: 2.6 minutes  INDICATIONS: Andrew [name] is a 64 y.o. male who  presents with malfunctioning right forearm AV access.  The patient is scheduled for angiography with possible intervention of the AV access.  The patient is aware the risks include but are not limited to: bleeding, infection, thrombosis of the cannulated access, and possible anaphylactic reaction to the contrast.  The patient acknowledges if the access can not be salvaged a tunneled catheter will be needed and will be placed during this procedure.  The patient is aware of the risks of the procedure and elects to proceed with the angiogram and intervention.  DESCRIPTION: After full informed written consent was obtained, the patient was brought back to the Special Procedure suite and placed supine position.  Appropriate cardiopulmonary monitors were placed.  The right arm was prepped and draped in the standard fashion.  Appropriate timeout is called. The forearm loop graft  was cannulated with a micropuncture needle using ultrasound. Graft was evaluated with the ultrasound showing it to be echolucent and homogeneous indicating patency. Images recorded for the permanent record. Needle puncture was made under direct ultrasound visualization  The microwire was advanced and the needle was exchanged for  a microsheath.  The J-wire was then advanced and a 6 Fr sheath inserted.  Hand  injections were completed to image the access from the arterial anastomosis through the entire access.  The central venous structures were also imaged by hand injections.  Based on the images,  3000 units of heparin was given and a Magic torque wire was advanced across the venous outflow. A 6 x 4 Lutonix balloon was then used and plasty the venous outflow 2 separate balloons were utilized inflations were to 14 atm for 2-3 minutes. With the balloon inflated reflux images were obtained of the arterial anastomosis. Follow-up imaging was then obtained of the venous outflow which demonstrated less than 50% residual stenosis. Therefore the procedure was terminated at this point.    A 4-0 Monocryl purse-string suture was sewn around the sheath.  The sheath was removed and light pressure was applied.  A sterile bandage was applied to the puncture site.  Interpretation: The forearm loop graft is in excellent condition. The arterial anastomosis is widely patent as is the visualized portions of the distal brachial artery. 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 Otherwise the brachial veins basilic vein axillary subclavian innominate and superior vena cava are widely patent.  Following and plasty there is now marked improvement in the venous outflow.  Summary: Successful salvage of right forearm loop graft   COMPLICATIONS: None  CONDITION: Carlynn Purl, M.D [facility] Office: [phone]  01/19/2015 5:19 PM

## 2015-01-20 ENCOUNTER — Encounter: Payer: Self-pay | Admitting: Vascular Surgery

## 2015-02-20 DEATH — deceased

## 2015-08-18 IMAGING — CT CT CHEST-ABD-PELV W/O CM
1 of 3 series · 12 of 29 positions shown, 18 images · non-contrast
Comparison: none

REASON FOR EXAM: (1) fever, h/o pna; (2) abd pain, fever
COMMENTS:

PROCEDURE:     CT  - CT CHEST ABDOMEN AND PELVIS WO  - March 19, 2013  [DATE]
RESULT:     CT chest abdomen pelvis dated 03/19/2013 comparison to prior
dated 02/11/2013
TECHNIQUE: Helical 3 mm sections were obtained from the thoracic inlet
through the pubic symphysis.

[Series 2: soft tissue · axial · 0.98mm/px · z∈[+484,+1008]mm · 12 of 211 slices shown, 18 images]
[im 18/211  mediastinal]
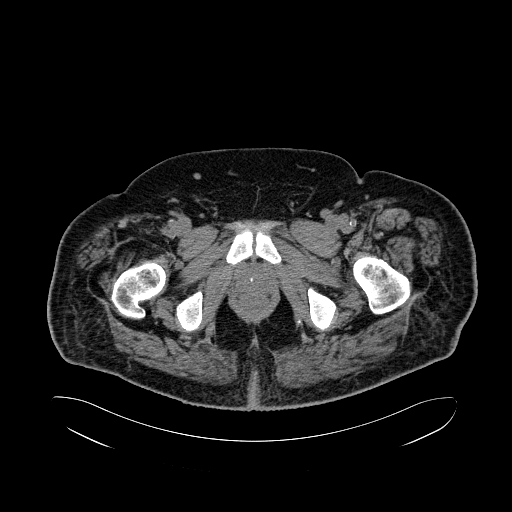
[im 18/211  bone]
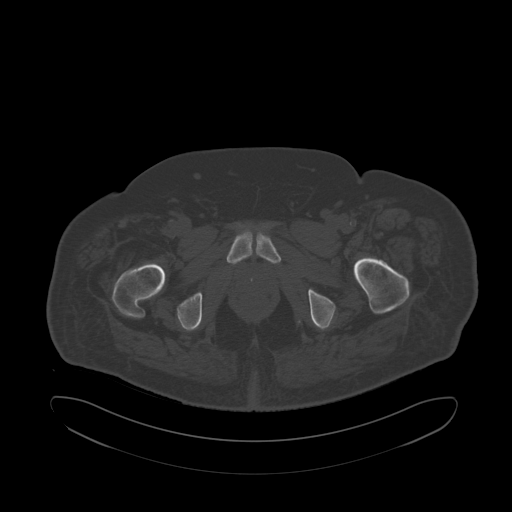
[im 36/211  mediastinal]
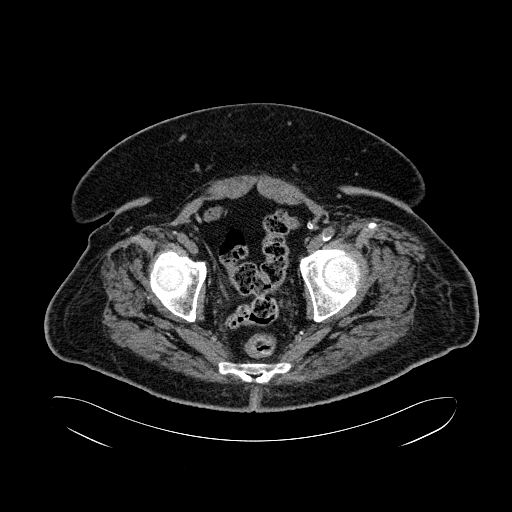
[im 53/211  mediastinal]
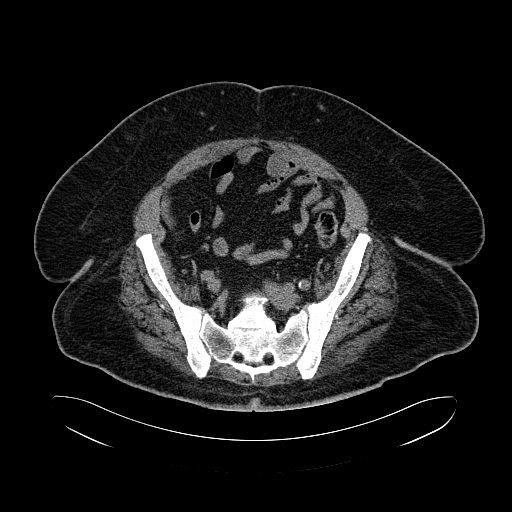
[im 71/211  mediastinal]
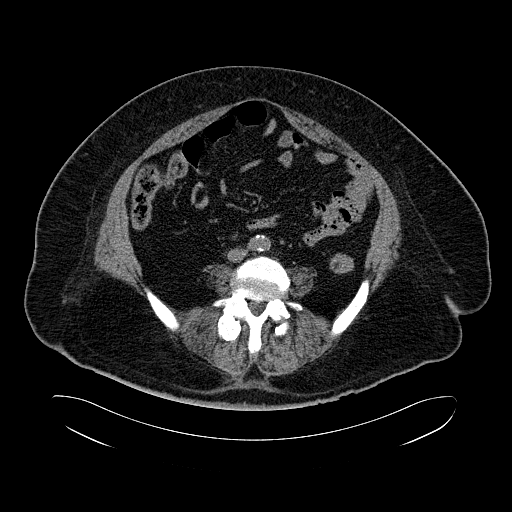
[im 88/211  mediastinal]
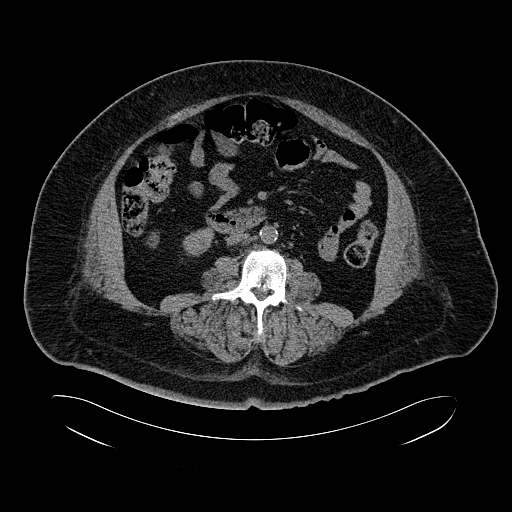
[im 103/211  mediastinal]
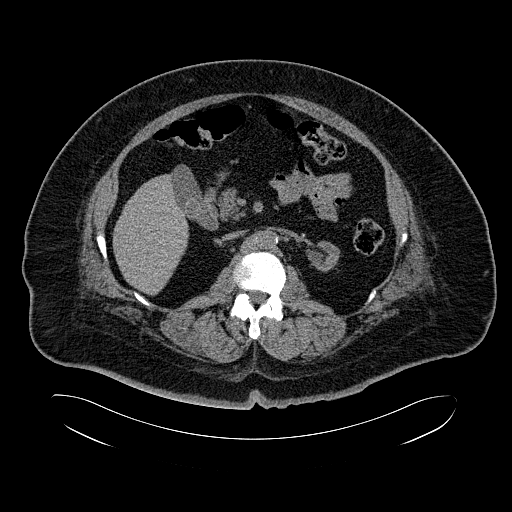
[im 106/211  mediastinal]
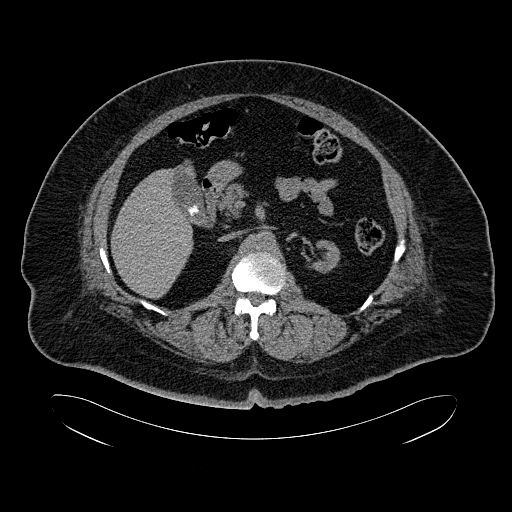
[im 123/211  mediastinal]
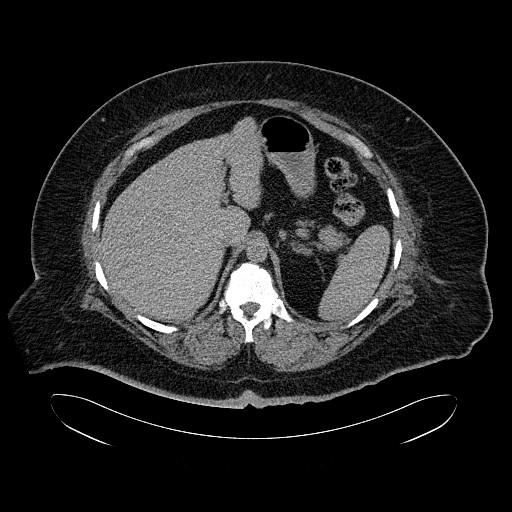
[im 141/211  mediastinal]
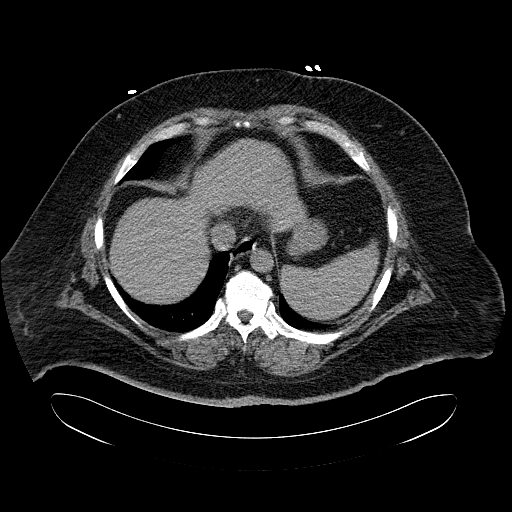
[im 141/211  lung]
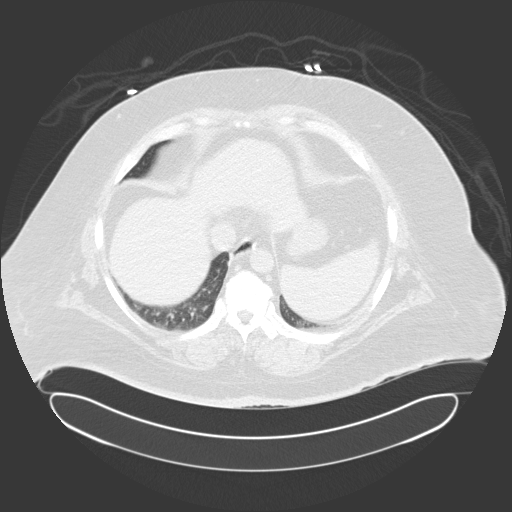
[im 141/211  bone]
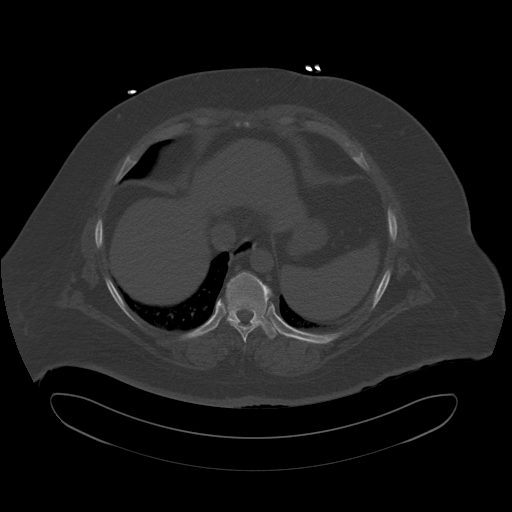
[im 158/211  mediastinal]
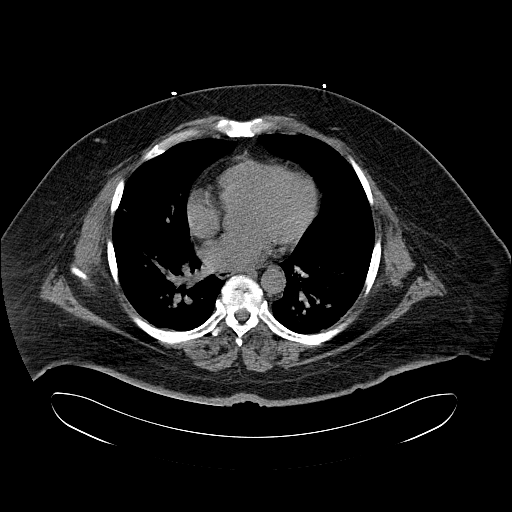
[im 158/211  lung]
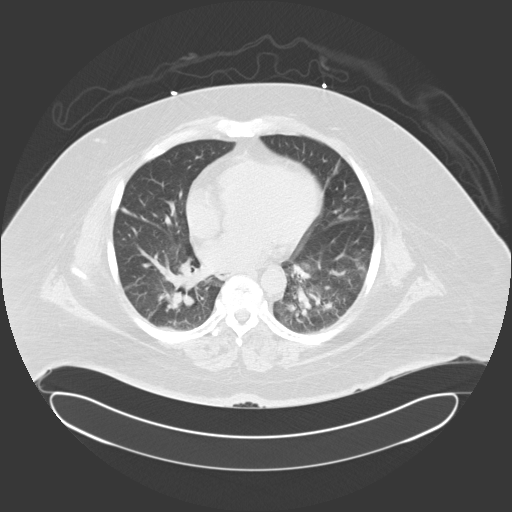
[im 176/211  mediastinal]
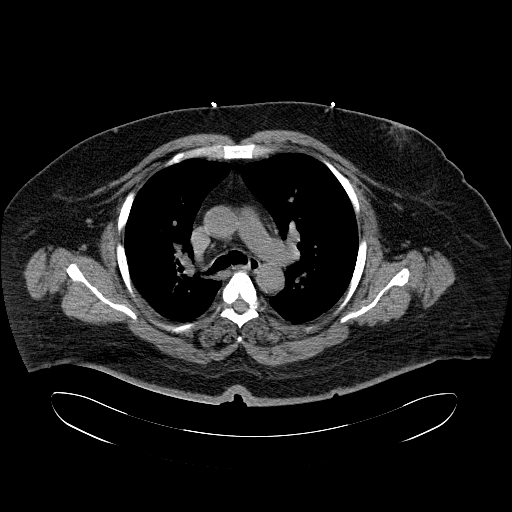
[im 176/211  lung]
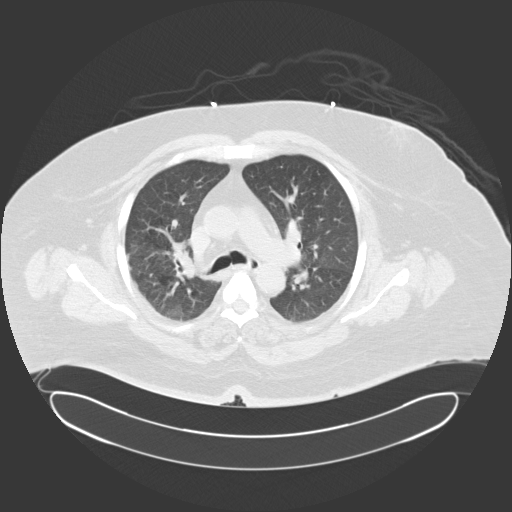
[im 193/211  mediastinal]
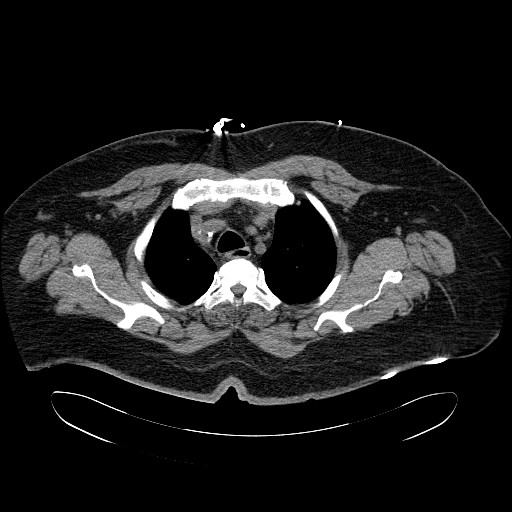
[im 193/211  lung]
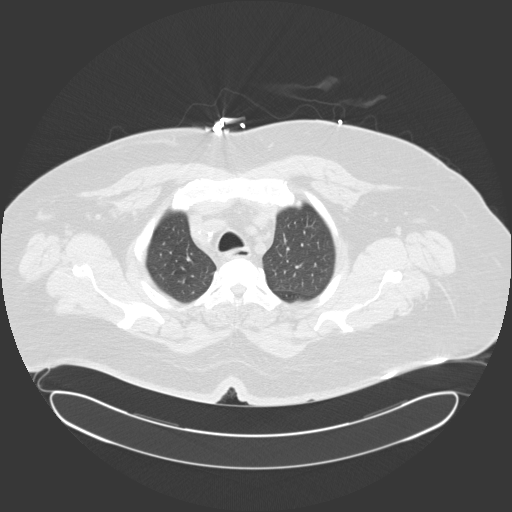

[12 of 29 positions shown; findings below may reference images not displayed]

FINDINGS: Noncontrast evaluation of the mediastinum demonstrates findings
which may reflect a component of hilar adenopathy. Evaluation is limited due
to lack of intravenous contrast. A small calcified lymph node projects in
the right hilar region. Atherosclerotic calcification appreciated within the
coronary arteries. Mild diffuse ground glass density projects in the upper
lobes. Areas of ill-defined density project within the lung bases which when
compared to previous CT dated 02/11/2013 have decreased in conspicuity. No
focal regions of consolidation appreciated.

Noncontrast evaluation of the liver, spleen, adrenals, pancreas
unremarkable. The kidneys are atrophic. Calcified gallstone is appreciated
within the gallbladder.

There is no evidence of bowel obstruction, enteritis, colitis,
diverticulitis, nor appendicitis. The urinary bladder is partially
contracted. There is mild inflammatory change within the surrounding
perivesicular fat. Atherosclerotic calcifications within abdominal aorta and
mesenteric vessels.
IMPRESSION: Improvement in the patient's bibasilar infiltrates.
2. Stranding within the perivesicular fat described above which may reflect
the sequela of cystitis. The urinary bladder is partially contracted.
Clinical correlation recommended.
3. Atherosclerotic changes
4. Calcified gallstones without evidence of cholecystitis.
5. Marked atrophy and kidneys

## 2015-08-20 IMAGING — US US EXTREM NON VASC*L* COMPLETE
1 series · 14 of 14 positions shown · non-contrast
Comparison: none

<!--  IDXRADR:ADDEND:BEGIN -->Addendum Begins
REASON FOR EXAM: area of induration overlying LUE AV dialysis access and
pain
COMMENTS:

PROCEDURE:     US  - US NON-VASCULAR EXTREMITY LT  - March 21, 2013  [DATE]
RESULT:
TECHNIQUE: Grayscale and duplex color flow Doppler imaging was performed of
the region of interest in the left upper extremity.
Evaluation of the dialysis graft demonstrates patency. There is no evidence
of appreciable loculated fluid collections or significant free fluid. Edema
is identified within the subcutaneous tissues.

[Series 1: us extrem non vasc*left* complete · 0.06mm/px · 14 of 14 slices shown]
[im 1/14]
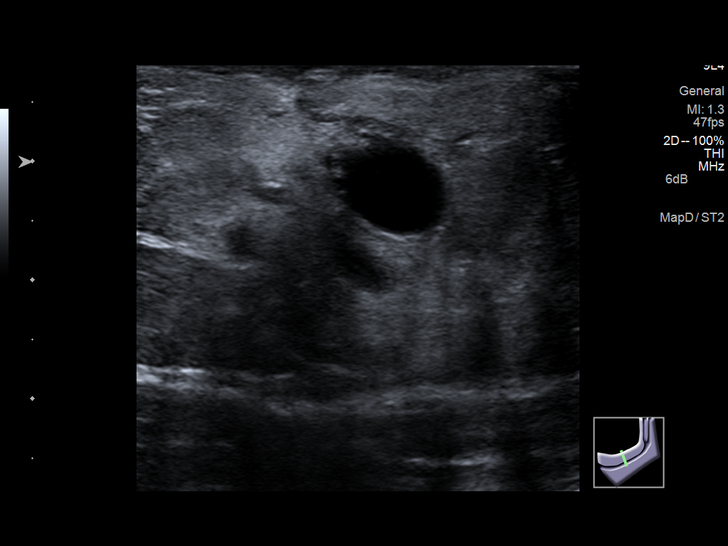
[im 2/14]
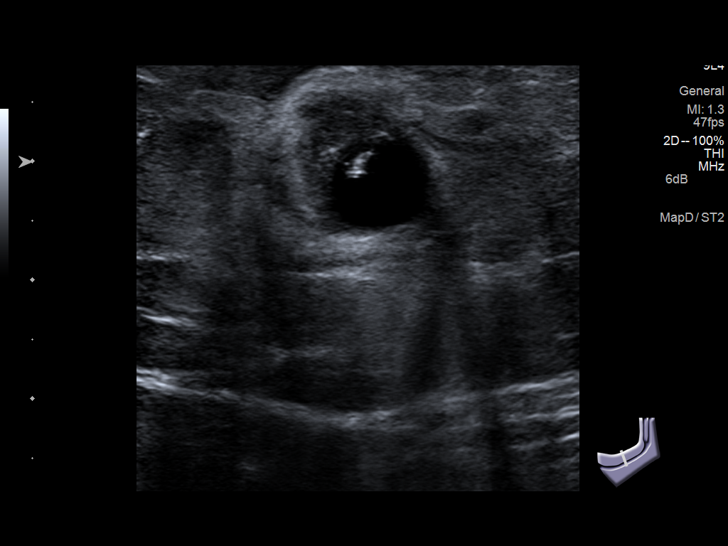
[im 3/14]
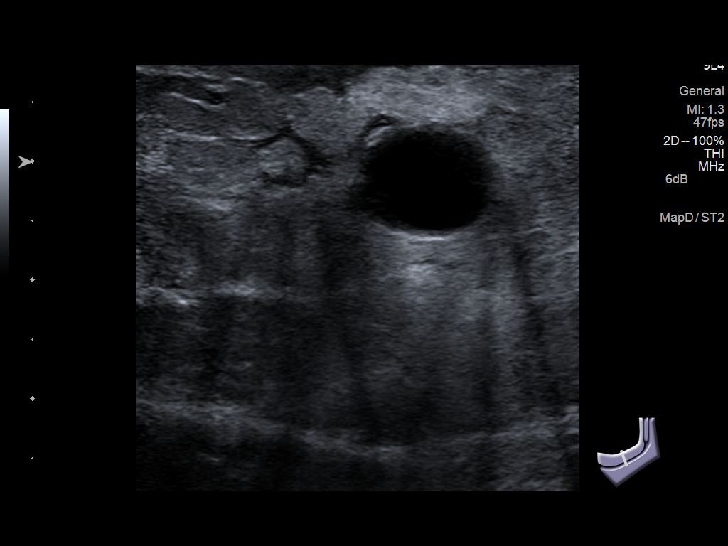
[im 4/14]
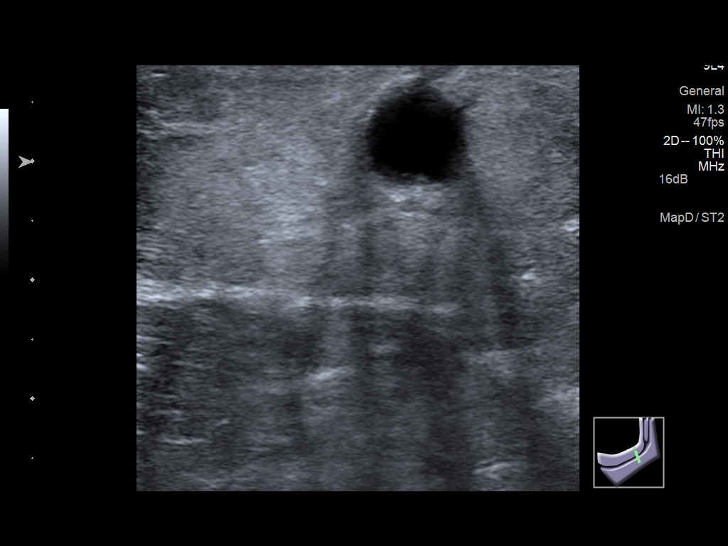
[im 5/14]
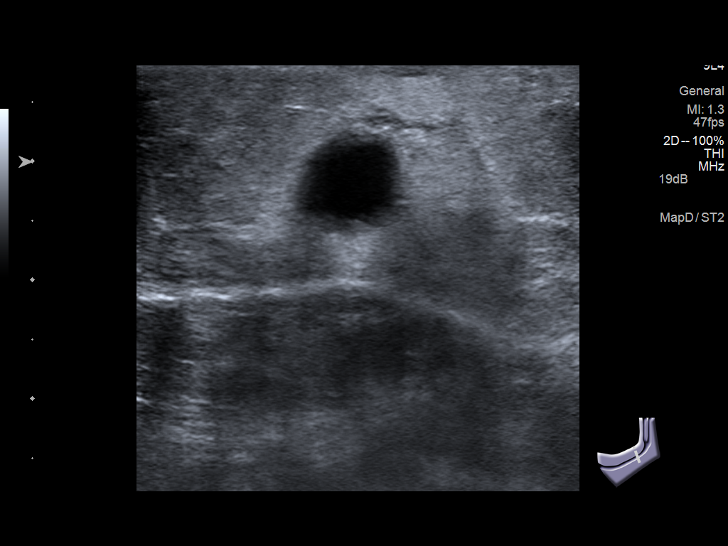
[im 6/14]
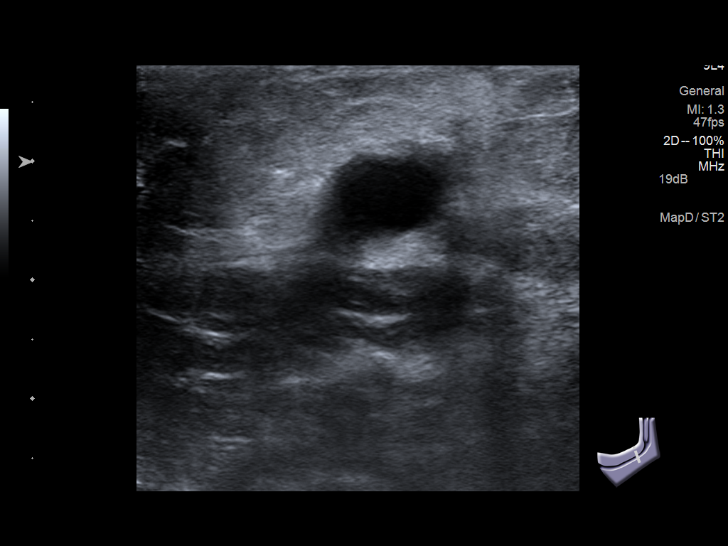
[im 7/14]
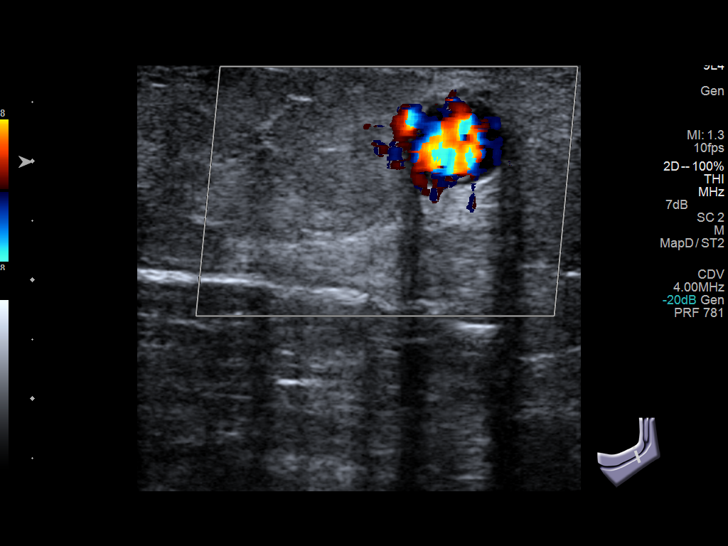
[im 8/14]
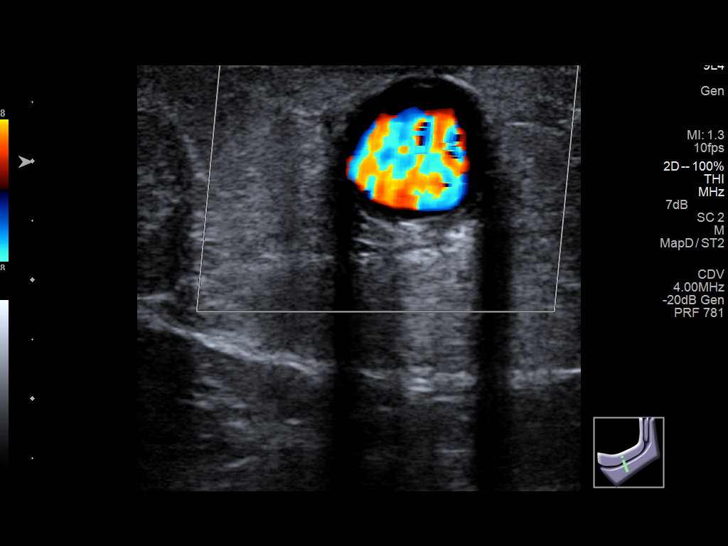
[im 9/14]
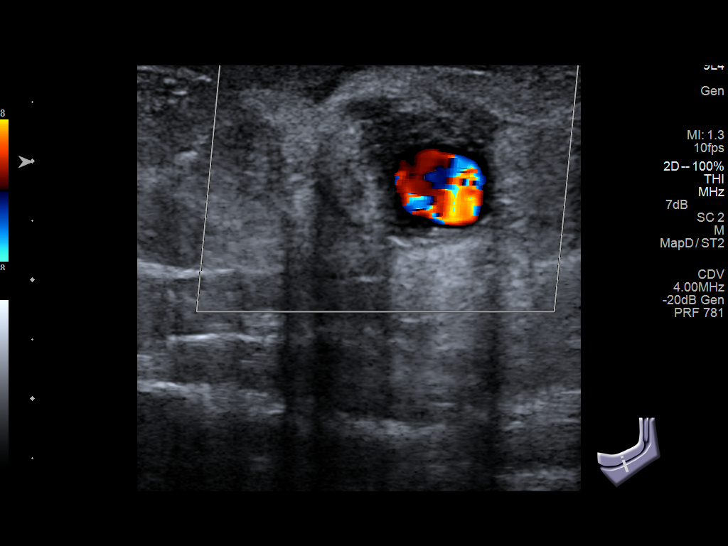
[im 10/14]
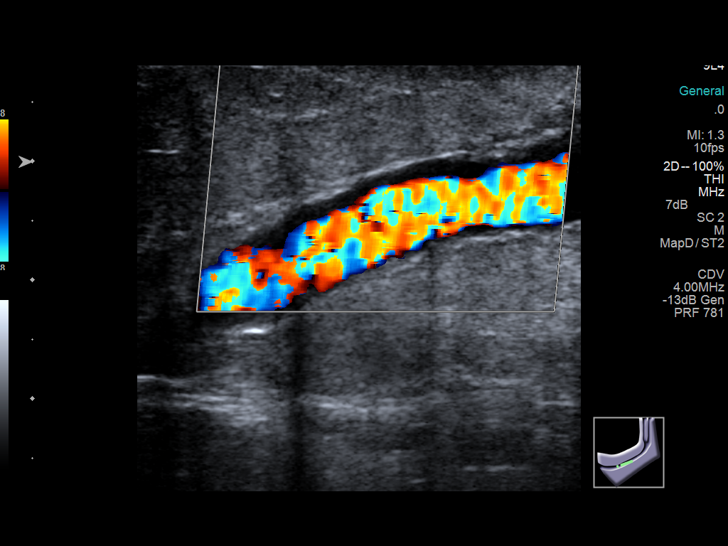
[im 11/14]
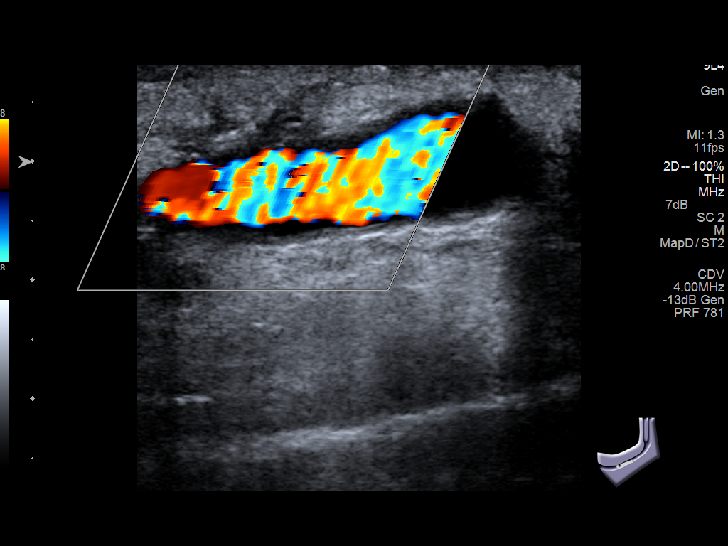
[im 12/14]
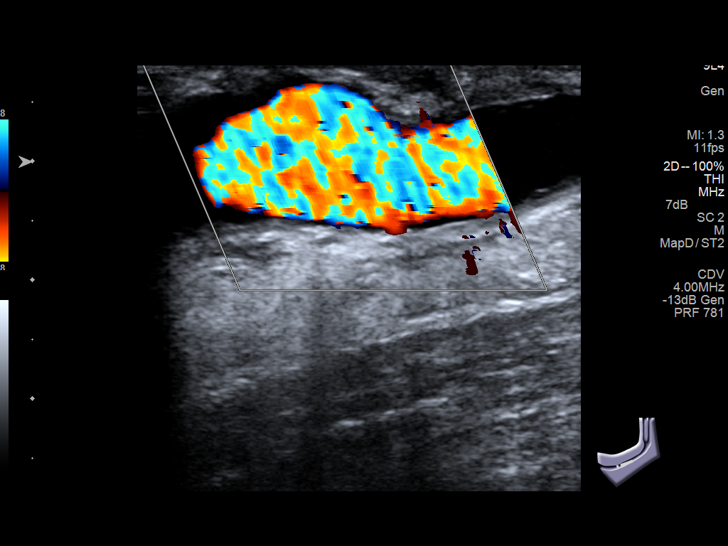
[im 13/14]
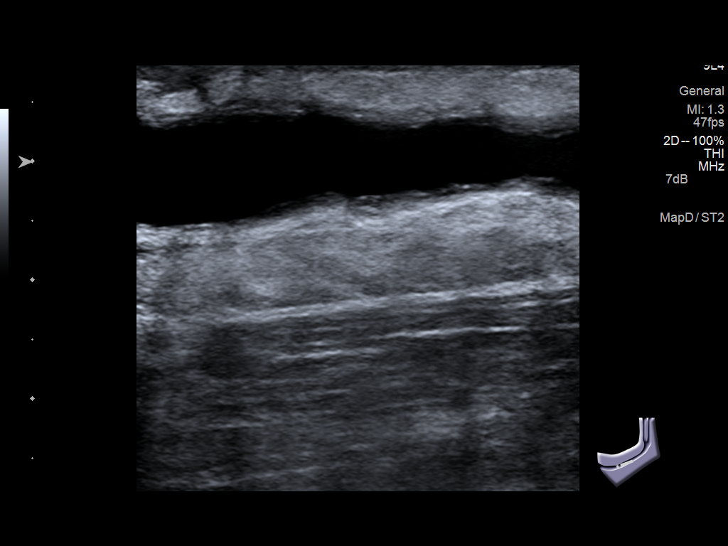
[im 14/14]
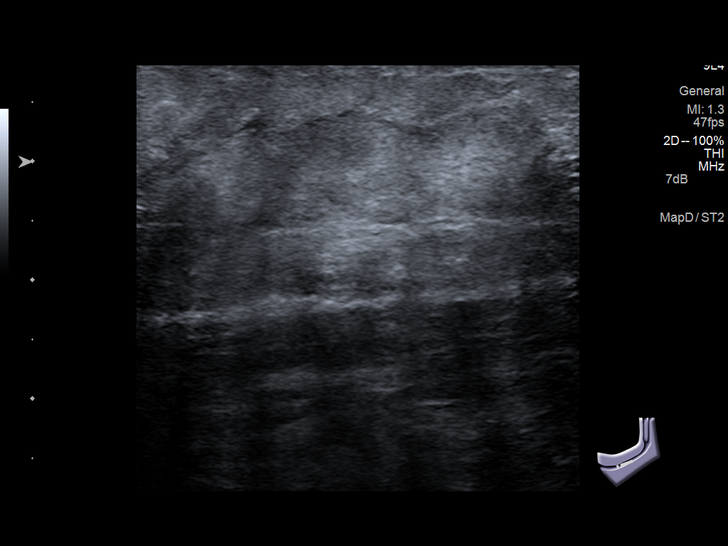

[14 of 14 positions shown; findings below may reference images not displayed]

IMPRESSION: 1.     Patent dialysis graft.
2.     Edema within the subcutaneous tissues.
3.     No free fluid or loculated fluid collections to suggest an abscess or
phlegmon.

<!--  IDXRADR:ADDEND:INNER_END -->Addendum Ends
<!--  IDXRADR:ADDEND:END -->
